# Patient Record
Sex: Female | Born: 1935 | Race: White | Hispanic: No | Smoking: Former smoker
Health system: Southern US, Community
[De-identification: ages and names within clinical notes are randomized; demographics above are authoritative.]

## PROBLEM LIST (undated history)

## (undated) DIAGNOSIS — F419 Anxiety disorder, unspecified: Secondary | ICD-10-CM

## (undated) DIAGNOSIS — K449 Diaphragmatic hernia without obstruction or gangrene: Secondary | ICD-10-CM

## (undated) DIAGNOSIS — K2970 Gastritis, unspecified, without bleeding: Secondary | ICD-10-CM

## (undated) DIAGNOSIS — J8410 Pulmonary fibrosis, unspecified: Secondary | ICD-10-CM

## (undated) DIAGNOSIS — D649 Anemia, unspecified: Secondary | ICD-10-CM

## (undated) DIAGNOSIS — K222 Esophageal obstruction: Secondary | ICD-10-CM

## (undated) DIAGNOSIS — C189 Malignant neoplasm of colon, unspecified: Secondary | ICD-10-CM

## (undated) DIAGNOSIS — I10 Essential (primary) hypertension: Secondary | ICD-10-CM

## (undated) DIAGNOSIS — N393 Stress incontinence (female) (male): Secondary | ICD-10-CM

## (undated) DIAGNOSIS — L820 Inflamed seborrheic keratosis: Secondary | ICD-10-CM

## (undated) DIAGNOSIS — I491 Atrial premature depolarization: Secondary | ICD-10-CM

## (undated) DIAGNOSIS — I2510 Atherosclerotic heart disease of native coronary artery without angina pectoris: Secondary | ICD-10-CM

## (undated) DIAGNOSIS — K2210 Ulcer of esophagus without bleeding: Secondary | ICD-10-CM

## (undated) DIAGNOSIS — I214 Non-ST elevation (NSTEMI) myocardial infarction: Secondary | ICD-10-CM

## (undated) DIAGNOSIS — L309 Dermatitis, unspecified: Secondary | ICD-10-CM

## (undated) DIAGNOSIS — K5790 Diverticulosis of intestine, part unspecified, without perforation or abscess without bleeding: Secondary | ICD-10-CM

## (undated) DIAGNOSIS — C50919 Malignant neoplasm of unspecified site of unspecified female breast: Secondary | ICD-10-CM

## (undated) HISTORY — PX: COLON SURGERY: SHX602

## (undated) HISTORY — PX: APPENDECTOMY: SHX54

## (undated) HISTORY — DX: Ulcer of esophagus without bleeding: K22.10

## (undated) HISTORY — PX: TONSILLECTOMY: SUR1361

## (undated) HISTORY — DX: Essential (primary) hypertension: I10

## (undated) HISTORY — PX: BREAST LUMPECTOMY: SHX2

## (undated) HISTORY — DX: Atherosclerotic heart disease of native coronary artery without angina pectoris: I25.10

## (undated) HISTORY — PX: CATARACT EXTRACTION, BILATERAL: SHX1313

## (undated) HISTORY — PX: TOTAL ABDOMINAL HYSTERECTOMY W/ BILATERAL SALPINGOOPHORECTOMY: SHX83

## (undated) HISTORY — PX: TOTAL ABDOMINAL HYSTERECTOMY: SHX209

## (undated) HISTORY — DX: Diaphragmatic hernia without obstruction or gangrene: K44.9

## (undated) HISTORY — DX: Dermatitis, unspecified: L30.9

## (undated) HISTORY — DX: Stress incontinence (female) (male): N39.3

## (undated) HISTORY — DX: Anxiety disorder, unspecified: F41.9

## (undated) HISTORY — PX: SHOULDER ARTHROSCOPY W/ ACROMIAL REPAIR: SUR94

## (undated) HISTORY — DX: Anemia, unspecified: D64.9

## (undated) HISTORY — DX: Malignant neoplasm of unspecified site of unspecified female breast: C50.919

## (undated) HISTORY — PX: DILATION AND CURETTAGE OF UTERUS: SHX78

## (undated) HISTORY — DX: Inflamed seborrheic keratosis: L82.0

## (undated) HISTORY — DX: Pulmonary fibrosis, unspecified: J84.10

## (undated) HISTORY — DX: Esophageal obstruction: K22.2

## (undated) HISTORY — DX: Diverticulosis of intestine, part unspecified, without perforation or abscess without bleeding: K57.90

---

## 1997-12-31 ENCOUNTER — Emergency Department (HOSPITAL_COMMUNITY): Admission: EM | Admit: 1997-12-31 | Discharge: 1997-12-31 | Payer: Self-pay | Admitting: Emergency Medicine

## 1998-01-01 ENCOUNTER — Ambulatory Visit (HOSPITAL_COMMUNITY): Admission: RE | Admit: 1998-01-01 | Discharge: 1998-01-01 | Payer: Self-pay | Admitting: Emergency Medicine

## 1998-01-01 ENCOUNTER — Encounter: Payer: Self-pay | Admitting: Emergency Medicine

## 1998-08-12 ENCOUNTER — Emergency Department (HOSPITAL_COMMUNITY): Admission: EM | Admit: 1998-08-12 | Discharge: 1998-08-12 | Payer: Self-pay | Admitting: Emergency Medicine

## 1998-10-09 ENCOUNTER — Ambulatory Visit (HOSPITAL_COMMUNITY): Admission: RE | Admit: 1998-10-09 | Discharge: 1998-10-09 | Payer: Self-pay | Admitting: Internal Medicine

## 1998-10-09 ENCOUNTER — Encounter (HOSPITAL_BASED_OUTPATIENT_CLINIC_OR_DEPARTMENT_OTHER): Payer: Self-pay | Admitting: Internal Medicine

## 1998-11-17 ENCOUNTER — Encounter (INDEPENDENT_AMBULATORY_CARE_PROVIDER_SITE_OTHER): Payer: Self-pay

## 1998-11-17 ENCOUNTER — Other Ambulatory Visit: Admission: RE | Admit: 1998-11-17 | Discharge: 1998-11-17 | Payer: Self-pay | Admitting: Gastroenterology

## 1998-12-16 ENCOUNTER — Ambulatory Visit (HOSPITAL_COMMUNITY): Admission: RE | Admit: 1998-12-16 | Discharge: 1998-12-16 | Payer: Self-pay | Admitting: Gastroenterology

## 1998-12-16 ENCOUNTER — Encounter: Payer: Self-pay | Admitting: Gastroenterology

## 1999-04-03 ENCOUNTER — Emergency Department (HOSPITAL_COMMUNITY): Admission: EM | Admit: 1999-04-03 | Discharge: 1999-04-03 | Payer: Self-pay | Admitting: Emergency Medicine

## 1999-10-11 ENCOUNTER — Encounter: Admission: RE | Admit: 1999-10-11 | Discharge: 1999-10-11 | Payer: Self-pay | Admitting: Family Medicine

## 1999-10-11 ENCOUNTER — Encounter: Payer: Self-pay | Admitting: Family Medicine

## 1999-10-15 ENCOUNTER — Encounter: Admission: RE | Admit: 1999-10-15 | Discharge: 1999-10-15 | Payer: Self-pay | Admitting: Family Medicine

## 1999-10-15 ENCOUNTER — Encounter: Payer: Self-pay | Admitting: Family Medicine

## 1999-10-20 ENCOUNTER — Encounter: Payer: Self-pay | Admitting: Family Medicine

## 1999-10-20 ENCOUNTER — Other Ambulatory Visit: Admission: RE | Admit: 1999-10-20 | Discharge: 1999-10-20 | Payer: Self-pay | Admitting: Family Medicine

## 1999-10-20 ENCOUNTER — Encounter (INDEPENDENT_AMBULATORY_CARE_PROVIDER_SITE_OTHER): Payer: Self-pay | Admitting: *Deleted

## 1999-10-20 ENCOUNTER — Encounter: Admission: RE | Admit: 1999-10-20 | Discharge: 1999-10-20 | Payer: Self-pay

## 1999-10-26 ENCOUNTER — Encounter: Payer: Self-pay | Admitting: Surgery

## 1999-10-26 ENCOUNTER — Encounter: Admission: RE | Admit: 1999-10-26 | Discharge: 1999-10-26 | Payer: Self-pay | Admitting: Surgery

## 1999-10-27 ENCOUNTER — Ambulatory Visit (HOSPITAL_BASED_OUTPATIENT_CLINIC_OR_DEPARTMENT_OTHER): Admission: RE | Admit: 1999-10-27 | Discharge: 1999-10-27 | Payer: Self-pay | Admitting: Surgery

## 1999-10-27 ENCOUNTER — Encounter: Payer: Self-pay | Admitting: Surgery

## 1999-10-27 ENCOUNTER — Encounter (INDEPENDENT_AMBULATORY_CARE_PROVIDER_SITE_OTHER): Payer: Self-pay | Admitting: *Deleted

## 1999-11-05 ENCOUNTER — Encounter: Admission: RE | Admit: 1999-11-05 | Discharge: 2000-02-03 | Payer: Self-pay | Admitting: Radiation Oncology

## 1999-12-10 ENCOUNTER — Emergency Department (HOSPITAL_COMMUNITY): Admission: EM | Admit: 1999-12-10 | Discharge: 1999-12-10 | Payer: Self-pay | Admitting: Emergency Medicine

## 2000-04-11 ENCOUNTER — Encounter: Admission: RE | Admit: 2000-04-11 | Discharge: 2000-04-11 | Payer: Self-pay | Admitting: Oncology

## 2000-04-11 ENCOUNTER — Encounter: Payer: Self-pay | Admitting: Oncology

## 2001-04-12 ENCOUNTER — Encounter: Admission: RE | Admit: 2001-04-12 | Discharge: 2001-04-12 | Payer: Self-pay | Admitting: Oncology

## 2001-04-12 ENCOUNTER — Encounter: Payer: Self-pay | Admitting: Oncology

## 2001-08-14 ENCOUNTER — Encounter: Admission: RE | Admit: 2001-08-14 | Discharge: 2001-08-14 | Payer: Self-pay | Admitting: Family Medicine

## 2001-08-14 ENCOUNTER — Encounter: Payer: Self-pay | Admitting: Family Medicine

## 2002-05-06 ENCOUNTER — Encounter: Payer: Self-pay | Admitting: Oncology

## 2002-05-06 ENCOUNTER — Encounter: Admission: RE | Admit: 2002-05-06 | Discharge: 2002-05-06 | Payer: Self-pay | Admitting: Oncology

## 2003-05-28 ENCOUNTER — Encounter: Admission: RE | Admit: 2003-05-28 | Discharge: 2003-05-28 | Payer: Self-pay | Admitting: Family Medicine

## 2003-12-09 ENCOUNTER — Ambulatory Visit: Payer: Self-pay | Admitting: Oncology

## 2004-01-04 HISTORY — PX: CARDIAC CATHETERIZATION: SHX172

## 2004-06-07 ENCOUNTER — Encounter: Admission: RE | Admit: 2004-06-07 | Discharge: 2004-06-07 | Payer: Self-pay | Admitting: Surgery

## 2004-06-09 ENCOUNTER — Ambulatory Visit: Payer: Self-pay | Admitting: Family Medicine

## 2004-06-10 ENCOUNTER — Ambulatory Visit: Payer: Self-pay | Admitting: Family Medicine

## 2004-06-10 ENCOUNTER — Encounter: Admission: RE | Admit: 2004-06-10 | Discharge: 2004-06-10 | Payer: Self-pay | Admitting: Family Medicine

## 2004-06-13 ENCOUNTER — Emergency Department (HOSPITAL_COMMUNITY): Admission: EM | Admit: 2004-06-13 | Discharge: 2004-06-13 | Payer: Self-pay | Admitting: Emergency Medicine

## 2004-06-14 ENCOUNTER — Ambulatory Visit: Payer: Self-pay | Admitting: Oncology

## 2004-06-15 ENCOUNTER — Ambulatory Visit: Payer: Self-pay | Admitting: Internal Medicine

## 2004-06-17 ENCOUNTER — Ambulatory Visit: Payer: Self-pay | Admitting: Internal Medicine

## 2004-06-28 ENCOUNTER — Ambulatory Visit: Payer: Self-pay | Admitting: Cardiology

## 2004-06-30 ENCOUNTER — Ambulatory Visit: Payer: Self-pay | Admitting: Cardiology

## 2004-06-30 ENCOUNTER — Inpatient Hospital Stay (HOSPITAL_BASED_OUTPATIENT_CLINIC_OR_DEPARTMENT_OTHER): Admission: RE | Admit: 2004-06-30 | Discharge: 2004-06-30 | Payer: Self-pay | Admitting: Cardiology

## 2004-07-13 ENCOUNTER — Ambulatory Visit: Payer: Self-pay | Admitting: *Deleted

## 2004-07-20 ENCOUNTER — Ambulatory Visit: Payer: Self-pay | Admitting: Cardiology

## 2004-07-26 ENCOUNTER — Encounter (HOSPITAL_COMMUNITY): Admission: RE | Admit: 2004-07-26 | Discharge: 2004-10-24 | Payer: Self-pay | Admitting: Cardiology

## 2004-09-09 ENCOUNTER — Ambulatory Visit: Payer: Self-pay | Admitting: Cardiology

## 2004-09-30 ENCOUNTER — Ambulatory Visit: Payer: Self-pay

## 2004-11-09 ENCOUNTER — Ambulatory Visit: Payer: Self-pay

## 2004-12-10 ENCOUNTER — Ambulatory Visit: Payer: Self-pay | Admitting: Oncology

## 2005-07-01 ENCOUNTER — Encounter: Admission: RE | Admit: 2005-07-01 | Discharge: 2005-07-01 | Payer: Self-pay | Admitting: Family Medicine

## 2005-07-21 ENCOUNTER — Ambulatory Visit: Payer: Self-pay | Admitting: Family Medicine

## 2005-07-21 ENCOUNTER — Emergency Department (HOSPITAL_COMMUNITY): Admission: EM | Admit: 2005-07-21 | Discharge: 2005-07-21 | Payer: Self-pay | Admitting: Emergency Medicine

## 2005-07-22 ENCOUNTER — Ambulatory Visit: Payer: Self-pay | Admitting: Family Medicine

## 2005-07-25 ENCOUNTER — Ambulatory Visit: Payer: Self-pay | Admitting: Family Medicine

## 2005-08-02 ENCOUNTER — Ambulatory Visit: Payer: Self-pay | Admitting: Cardiology

## 2005-08-02 ENCOUNTER — Ambulatory Visit: Payer: Self-pay | Admitting: Family Medicine

## 2005-08-02 ENCOUNTER — Inpatient Hospital Stay (HOSPITAL_COMMUNITY): Admission: AD | Admit: 2005-08-02 | Discharge: 2005-08-04 | Payer: Self-pay | Admitting: Cardiology

## 2005-08-03 ENCOUNTER — Ambulatory Visit: Payer: Self-pay | Admitting: Cardiology

## 2005-08-03 ENCOUNTER — Encounter: Payer: Self-pay | Admitting: Cardiology

## 2005-08-08 ENCOUNTER — Ambulatory Visit: Payer: Self-pay | Admitting: Internal Medicine

## 2005-08-12 ENCOUNTER — Ambulatory Visit: Payer: Self-pay

## 2005-08-16 ENCOUNTER — Ambulatory Visit: Payer: Self-pay | Admitting: Cardiology

## 2005-08-18 ENCOUNTER — Ambulatory Visit: Payer: Self-pay | Admitting: Family Medicine

## 2005-08-30 ENCOUNTER — Emergency Department (HOSPITAL_COMMUNITY): Admission: EM | Admit: 2005-08-30 | Discharge: 2005-08-30 | Payer: Self-pay | Admitting: Family Medicine

## 2005-09-01 ENCOUNTER — Ambulatory Visit: Payer: Self-pay | Admitting: Cardiology

## 2005-09-02 ENCOUNTER — Emergency Department (HOSPITAL_COMMUNITY): Admission: EM | Admit: 2005-09-02 | Discharge: 2005-09-02 | Payer: Self-pay | Admitting: Family Medicine

## 2005-10-31 ENCOUNTER — Ambulatory Visit: Payer: Self-pay | Admitting: Family Medicine

## 2005-11-15 ENCOUNTER — Ambulatory Visit: Payer: Self-pay | Admitting: Family Medicine

## 2006-02-07 ENCOUNTER — Ambulatory Visit: Payer: Self-pay | Admitting: Cardiology

## 2006-07-11 ENCOUNTER — Encounter: Admission: RE | Admit: 2006-07-11 | Discharge: 2006-07-11 | Payer: Self-pay | Admitting: Family Medicine

## 2006-07-31 DIAGNOSIS — I10 Essential (primary) hypertension: Secondary | ICD-10-CM | POA: Insufficient documentation

## 2006-07-31 DIAGNOSIS — Z853 Personal history of malignant neoplasm of breast: Secondary | ICD-10-CM | POA: Insufficient documentation

## 2006-08-16 ENCOUNTER — Telehealth: Payer: Self-pay | Admitting: Family Medicine

## 2006-09-22 ENCOUNTER — Ambulatory Visit: Payer: Self-pay | Admitting: Family Medicine

## 2006-09-25 ENCOUNTER — Encounter: Payer: Self-pay | Admitting: Family Medicine

## 2006-09-25 LAB — CONVERTED CEMR LAB
Bilirubin Urine: NEGATIVE
Glucose, Urine, Semiquant: NEGATIVE
Nitrite: POSITIVE
WBC Urine, dipstick: NEGATIVE

## 2006-11-10 ENCOUNTER — Ambulatory Visit: Payer: Self-pay | Admitting: Family Medicine

## 2006-11-10 DIAGNOSIS — L820 Inflamed seborrheic keratosis: Secondary | ICD-10-CM

## 2007-06-17 ENCOUNTER — Emergency Department (HOSPITAL_COMMUNITY): Admission: EM | Admit: 2007-06-17 | Discharge: 2007-06-17 | Payer: Self-pay | Admitting: Family Medicine

## 2007-07-02 ENCOUNTER — Ambulatory Visit: Payer: Self-pay | Admitting: Family Medicine

## 2007-07-02 DIAGNOSIS — L02419 Cutaneous abscess of limb, unspecified: Secondary | ICD-10-CM

## 2007-07-02 DIAGNOSIS — L242 Irritant contact dermatitis due to solvents: Secondary | ICD-10-CM

## 2007-07-02 DIAGNOSIS — L03119 Cellulitis of unspecified part of limb: Secondary | ICD-10-CM

## 2007-07-20 ENCOUNTER — Ambulatory Visit: Payer: Self-pay | Admitting: Family Medicine

## 2007-08-01 ENCOUNTER — Encounter: Admission: RE | Admit: 2007-08-01 | Discharge: 2007-08-01 | Payer: Self-pay | Admitting: Family Medicine

## 2007-08-27 ENCOUNTER — Telehealth: Payer: Self-pay | Admitting: Internal Medicine

## 2007-10-03 ENCOUNTER — Ambulatory Visit: Payer: Self-pay | Admitting: Family Medicine

## 2007-10-03 DIAGNOSIS — I2510 Atherosclerotic heart disease of native coronary artery without angina pectoris: Secondary | ICD-10-CM

## 2007-10-03 LAB — CONVERTED CEMR LAB
Bilirubin Urine: NEGATIVE
Blood in Urine, dipstick: NEGATIVE
Nitrite: NEGATIVE
Protein, U semiquant: NEGATIVE
Specific Gravity, Urine: 1.01
Urobilinogen, UA: 0.2

## 2007-10-04 LAB — CONVERTED CEMR LAB
ALT: 14 units/L (ref 0–35)
Albumin: 4.1 g/dL (ref 3.5–5.2)
BUN: 21 mg/dL (ref 6–23)
Eosinophils Absolute: 0.2 10*3/uL (ref 0.0–0.7)
GFR calc non Af Amer: 85 mL/min
Glucose, Bld: 87 mg/dL (ref 70–99)
Lymphocytes Relative: 17.3 % (ref 12.0–46.0)
MCHC: 34.6 g/dL (ref 30.0–36.0)
MCV: 97.8 fL (ref 78.0–100.0)
Monocytes Relative: 11.4 % (ref 3.0–12.0)
Neutrophils Relative %: 68.4 % (ref 43.0–77.0)
Platelets: 171 10*3/uL (ref 150–400)
RDW: 13.9 % (ref 11.5–14.6)
TSH: 3.36 microintl units/mL (ref 0.35–5.50)
Total Bilirubin: 0.8 mg/dL (ref 0.3–1.2)

## 2008-03-19 ENCOUNTER — Telehealth: Payer: Self-pay | Admitting: Family Medicine

## 2008-07-22 ENCOUNTER — Ambulatory Visit: Payer: Self-pay | Admitting: Family Medicine

## 2008-07-22 DIAGNOSIS — R0789 Other chest pain: Secondary | ICD-10-CM | POA: Insufficient documentation

## 2008-07-23 ENCOUNTER — Telehealth: Payer: Self-pay | Admitting: Family Medicine

## 2008-07-24 ENCOUNTER — Emergency Department (HOSPITAL_COMMUNITY): Admission: EM | Admit: 2008-07-24 | Discharge: 2008-07-24 | Payer: Self-pay | Admitting: Emergency Medicine

## 2008-08-15 ENCOUNTER — Ambulatory Visit: Payer: Self-pay | Admitting: Internal Medicine

## 2008-08-15 DIAGNOSIS — J8410 Pulmonary fibrosis, unspecified: Secondary | ICD-10-CM

## 2008-08-20 LAB — CONVERTED CEMR LAB
Eosinophils Absolute: 0.2 10*3/uL (ref 0.0–0.7)
Hemoglobin: 12 g/dL (ref 12.0–15.0)
Lymphocytes Relative: 26.8 % (ref 12.0–46.0)
MCHC: 34.7 g/dL (ref 30.0–36.0)
MCV: 96.5 fL (ref 78.0–100.0)
Monocytes Absolute: 0.6 10*3/uL (ref 0.1–1.0)
Neutro Abs: 2.2 10*3/uL (ref 1.4–7.7)
Neutrophils Relative %: 52.8 % (ref 43.0–77.0)
RBC: 3.58 M/uL — ABNORMAL LOW (ref 3.87–5.11)
RDW: 13.1 % (ref 11.5–14.6)

## 2008-10-07 ENCOUNTER — Telehealth: Payer: Self-pay | Admitting: Family Medicine

## 2009-01-05 ENCOUNTER — Ambulatory Visit: Payer: Self-pay | Admitting: Family Medicine

## 2009-10-13 ENCOUNTER — Ambulatory Visit: Payer: Self-pay | Admitting: Family Medicine

## 2009-10-13 DIAGNOSIS — N393 Stress incontinence (female) (male): Secondary | ICD-10-CM

## 2009-10-13 LAB — CONVERTED CEMR LAB
Ketones, urine, test strip: NEGATIVE
Specific Gravity, Urine: 1.02
pH: 6

## 2009-10-27 ENCOUNTER — Telehealth: Payer: Self-pay | Admitting: Family Medicine

## 2010-01-08 ENCOUNTER — Ambulatory Visit
Admission: RE | Admit: 2010-01-08 | Discharge: 2010-01-08 | Payer: Self-pay | Source: Home / Self Care | Attending: Family Medicine | Admitting: Family Medicine

## 2010-01-08 ENCOUNTER — Encounter: Payer: Self-pay | Admitting: Family Medicine

## 2010-01-08 ENCOUNTER — Other Ambulatory Visit: Payer: Self-pay | Admitting: Family Medicine

## 2010-01-08 LAB — CONVERTED CEMR LAB
Glucose, Urine, Semiquant: NEGATIVE
Ketones, urine, test strip: NEGATIVE
Specific Gravity, Urine: 1.01
WBC Urine, dipstick: NEGATIVE
pH: 6

## 2010-01-08 LAB — BASIC METABOLIC PANEL
BUN: 18 mg/dL (ref 6–23)
CO2: 32 mEq/L (ref 19–32)
Calcium: 8.8 mg/dL (ref 8.4–10.5)
Chloride: 100 mEq/L (ref 96–112)
Creatinine, Ser: 0.9 mg/dL (ref 0.4–1.2)
GFR: 67.22 mL/min (ref >60.00)
Glucose, Bld: 90 mg/dL (ref 70–99)
Potassium: 4.4 mEq/L (ref 3.5–5.1)
Sodium: 139 mEq/L (ref 135–145)

## 2010-01-08 LAB — CBC WITH DIFFERENTIAL/PLATELET
Basophils Absolute: 0 10*3/uL (ref 0.0–0.1)
Basophils Relative: 0.4 % (ref 0.0–3.0)
Eosinophils Absolute: 0.2 10*3/uL (ref 0.0–0.7)
Eosinophils Relative: 2.7 % (ref 0.0–5.0)
HCT: 37.4 % (ref 36.0–46.0)
Hemoglobin: 12.7 g/dL (ref 12.0–15.0)
Lymphocytes Relative: 19.2 % (ref 12.0–46.0)
Lymphs Abs: 1.4 10*3/uL (ref 0.7–4.0)
MCHC: 34 g/dL (ref 30.0–36.0)
MCV: 99 fl (ref 78.0–100.0)
Monocytes Absolute: 0.8 10*3/uL (ref 0.1–1.0)
Monocytes Relative: 11.3 % (ref 3.0–12.0)
Neutro Abs: 4.9 10*3/uL (ref 1.4–7.7)
Neutrophils Relative %: 66.4 % (ref 43.0–77.0)
Platelets: 181 10*3/uL (ref 150.0–400.0)
RBC: 3.77 Mil/uL — ABNORMAL LOW (ref 3.87–5.11)
RDW: 13.9 % (ref 11.5–14.6)
WBC: 7.4 10*3/uL (ref 4.5–10.5)

## 2010-01-08 LAB — TSH: TSH: 3.34 u[IU]/mL (ref 0.35–5.50)

## 2010-01-08 LAB — HEPATIC FUNCTION PANEL
ALT: 14 U/L (ref 0–35)
AST: 23 U/L (ref 0–37)
Albumin: 3.8 g/dL (ref 3.5–5.2)
Alkaline Phosphatase: 57 U/L (ref 39–117)
Bilirubin, Direct: 0.2 mg/dL (ref 0.0–0.3)
Total Bilirubin: 0.5 mg/dL (ref 0.3–1.2)
Total Protein: 6.5 g/dL (ref 6.0–8.3)

## 2010-01-22 ENCOUNTER — Ambulatory Visit: Admit: 2010-01-22 | Payer: Self-pay | Admitting: Family Medicine

## 2010-01-31 LAB — CONVERTED CEMR LAB
ALT: 14 units/L (ref 0–35)
AST: 19 units/L (ref 0–37)
AST: 21 units/L (ref 0–37)
Albumin: 3.6 g/dL (ref 3.5–5.2)
Albumin: 3.9 g/dL (ref 3.5–5.2)
Alkaline Phosphatase: 45 units/L (ref 39–117)
BUN: 14 mg/dL (ref 6–23)
Basophils Absolute: 0.1 10*3/uL (ref 0.0–0.1)
Basophils Relative: 0.9 % (ref 0.0–3.0)
Bilirubin, Direct: 0 mg/dL (ref 0.0–0.3)
CO2: 31 meq/L (ref 19–32)
CO2: 34 meq/L — ABNORMAL HIGH (ref 19–32)
Calcium: 8.9 mg/dL (ref 8.4–10.5)
Chloride: 101 meq/L (ref 96–112)
Chloride: 105 meq/L (ref 96–112)
Creatinine, Ser: 0.7 mg/dL (ref 0.4–1.2)
Creatinine, Ser: 0.7 mg/dL (ref 0.4–1.2)
Eosinophils Absolute: 0.2 10*3/uL (ref 0.0–0.6)
Eosinophils Absolute: 0.3 10*3/uL (ref 0.0–0.7)
Eosinophils Relative: 3 % (ref 0.0–5.0)
Eosinophils Relative: 3.4 % (ref 0.0–5.0)
GFR calc non Af Amer: 84.3 mL/min (ref >60)
GFR calc non Af Amer: 85 mL/min
Glucose, Bld: 90 mg/dL (ref 70–99)
Glucose, Bld: 96 mg/dL (ref 70–99)
HCT: 35.7 % — ABNORMAL LOW (ref 36.0–46.0)
HCT: 37.2 % (ref 36.0–46.0)
Hemoglobin: 12.3 g/dL (ref 12.0–15.0)
Hemoglobin: 12.6 g/dL (ref 12.0–15.0)
Lymphocytes Relative: 20 % (ref 12.0–46.0)
Lymphocytes Relative: 30.9 % (ref 12.0–46.0)
Lymphs Abs: 1.6 10*3/uL (ref 0.7–4.0)
MCHC: 33.9 g/dL (ref 30.0–36.0)
MCHC: 34.4 g/dL (ref 30.0–36.0)
MCV: 102.9 fL — ABNORMAL HIGH (ref 78.0–100.0)
MCV: 97.8 fL (ref 78.0–100.0)
Monocytes Absolute: 0.9 10*3/uL (ref 0.1–1.0)
Monocytes Relative: 11.3 % (ref 3.0–12.0)
Neutro Abs: 5 10*3/uL (ref 1.4–7.7)
Neutrophils Relative %: 49.9 % (ref 43.0–77.0)
Neutrophils Relative %: 64.4 % (ref 43.0–77.0)
Platelets: 140 10*3/uL — ABNORMAL LOW (ref 150.0–400.0)
Potassium: 4.4 meq/L (ref 3.5–5.1)
RBC: 3.61 M/uL — ABNORMAL LOW (ref 3.87–5.11)
RBC: 3.65 M/uL — ABNORMAL LOW (ref 3.87–5.11)
RDW: 12.4 % (ref 11.5–14.6)
RDW: 14 % (ref 11.5–14.6)
Sodium: 141 meq/L (ref 135–145)
TSH: 1.95 microintl units/mL (ref 0.35–5.50)
TSH: 2.54 microintl units/mL (ref 0.35–5.50)
Total Bilirubin: 0.7 mg/dL (ref 0.3–1.2)
Total Bilirubin: 0.8 mg/dL (ref 0.3–1.2)
Total Protein: 6.5 g/dL (ref 6.0–8.3)
Total Protein: 7.1 g/dL (ref 6.0–8.3)
Vitamin B-12: 659 pg/mL (ref 211–911)
WBC: 6.6 10*3/uL (ref 4.5–10.5)
WBC: 7.9 10*3/uL (ref 4.5–10.5)

## 2010-02-02 NOTE — Progress Notes (Signed)
Summary: rx for cream  Phone Note From Pharmacy   Caller: [facility]. #[id number]* Summary of Call: [facility] is calling because the patient states that she was to get a rx for an anti-fungal cream? Initial call taken by: Kern Reap CMA Duncan Dull),  October 27, 2009 12:46 PM  Follow-up for Phone Call        OTC is fine Follow-up by: Roderick Pee MD,  October 27, 2009 12:48 PM  Additional Follow-up for Phone Call Additional follow up Details #1::        Phone call completed Additional Follow-up by: Kern Reap CMA Duncan Dull),  October 28, 2009 10:00 AM

## 2010-02-02 NOTE — Assessment & Plan Note (Signed)
Summary: BLADDER CONCERNS // RS   Vital Signs:  Patient profile:   77 year old female Weight:      168 pounds BMI:     28.94 Temp:     98.3 degrees F oral Pulse rate:   103 / minute Pulse rhythm:   regular BP sitting:   148 / 80  (left arm) Cuff size:   regular  Vitals Entered By: Mervin Hack CMA Duncan Dull) (October 13, 2009 10:11 AM) CC: overactive bladder   Primary Care Provider:  Dr. Kelle Darting  CC:  overactive bladder.  History of Present Illness: [name] is an 77 year old, divorced female who comes in today for evaluation of urinary incontinence.  She states she has chronic leakage in the past 6 months as gotten worse.  She has to constantly change her pads all day long.  She has the symptoms of infection.  Urinalysis here normal.  In the past.  She saw Dr. Etta Grandchild , before he retired.......I think it would be prudent for her to get another urologic consult.  I would recommend Dr. Charlyn Minerva   Allergies: 1)  ! Sulfa 2)  ! Codeine 3)  ! Indomethacin (Indomethacin) 4)  ! Pcn  Past History:  Past medical, surgical, family and social histories (including risk factors) reviewed for relevance to current acute and chronic problems.  Past Medical History: Reviewed history from 08/15/2008 and no changes required. Breast cancer, hx of     - lumpectomy followed by RT and Tamoxifen x 5 year Hypertension Colonoscopy-1998 Coronary artery disease dermatitis  Past Surgical History: Reviewed history from 07/31/2006 and no changes required. Hysterectomy Lumpectomy Oophorectomy Bilat shoulder repair  Family History: Reviewed history from 08/15/2008 and no changes required. Breast CA- Daughter Colon CA- Brother Heart dz- Mother and Father  Social History: Reviewed history from 08/15/2008 and no changes required. Retired Divorced Former Smoker. Quit in 1985.  Smoked aprox 30 years "socially" Alcohol use-twice per wk Drug use-no Regular exercise-yes Lives  alone  Review of Systems      See HPI  Physical Exam  General:  Well-developed,well-nourished,in no acute distress; alert,appropriate and cooperative throughout examination   Problems:  Medical Problems Added: 1)  Dx of Incontinence, Female Stress  (ICD-625.6)  Impression & Recommendations:  Problem # 1:  INCONTINENCE, FEMALE STRESS (ICD-625.6) Assessment New  Orders: UA Dipstick w/o Micro (manual) (44034)  Complete Medication List: 1)  Cartia Xt 120 Mg Cp24 (Diltiazem hcl coated beads) .... Once daily 2)  Klor-con M20 20 Meq Tbcr (Potassium chloride crys cr) .Marland Kitchen.. 1 3 times per wk 3)  Furosemide 20 Mg Tabs (Furosemide) .Marland Kitchen.. 1 once daily 4)  Lidex 0.05 % Crea (Fluocinonide) .... Apply at bedtime 5)  Lumigan 0.03 % Soln (Bimatoprost) .Marland Kitchen.. 1 drop each eye at bedtime 6)  Celebrex 100 Mg Caps (Celecoxib) .... Once daily  Other Orders: Influenza Vaccine MCR (74259)  Patient Instructions: 1)  call, Dr. Charlyn Minerva at the [facility]rology Center for a consultation.  I will send a copy of your medical records for him  Current Allergies (reviewed today): ! SULFA ! CODEINE ! INDOMETHACIN (INDOMETHACIN) ! PCN  Laboratory Results   Urine Tests  Date/Time Received: October 13, 2009 10:22 AM Date/Time Reported: October 13, 2009 10:22 AM  Routine Urinalysis   Color: yellow Appearance: Hazy Glucose: negative   (Normal Range: Negative) Bilirubin: negative   (Normal Range: Negative) Ketone: negative   (Normal Range: Negative) Spec. Gravity: 1.020   (Normal Range: 1.003-1.035) Blood: negative   (  Normal Range: Negative) pH: 6.0   (Normal Range: 5.0-8.0) Protein: negative   (Normal Range: Negative) Urobilinogen: 0.2   (Normal Range: 0-1) Nitrite: positive   (Normal Range: Negative) Leukocyte Esterace: trace   (Normal Range: Negative)        Influenza Vaccine    Vaccine Type: Fluvax MCR    Site: left deltoid    Mfr: GlaxoSmithKline    Dose: 0.5 ml    Route: IM    Given by:  Mervin Hack CMA (AAMA)    Exp. Date: 07/03/2010    Lot #: [id number]    VIS given: 07/28/09 version given October 13, 2009.  Flu Vaccine Consent Questions    Do you have a history of severe allergic reactions to this vaccine? no    Any prior history of allergic reactions to egg and/or gelatin? no    Do you have a sensitivity to the preservative Thimersol? no    Do you have a past history of Guillan-Barre Syndrome? no    Do you currently have an acute febrile illness? no    Have you ever had a severe reaction to latex? no    Vaccine information given and explained to patient? yes    Are you currently pregnant? no

## 2010-02-02 NOTE — Assessment & Plan Note (Signed)
Summary: emp/pt fasting/cjr/pt rsc/cjr   Vital Signs:  Patient profile:   77 year old female Height:      64 inches Weight:      168 pounds Temp:     98.7 degrees F oral BP sitting:   128 / 78  (left arm) Cuff size:   regular  Vitals Entered By: Kern Reap CMA Duncan Dull) (January 05, 2009 9:37 AM)  Reason for Visit cpx  Primary Care Provider:  Dr. Kelle Darting   History of Present Illness: [name] is an 77 year old single female, nonsmoker, who continues to live and function independently, who comes in today for evaluation of multiple issues.  She has underlying hypertension, for which he takes the deltiazem -120 mg daily,potassium 20 mEq 3 times per week, and Lasix 20 mg daily.  BP 120/78.  She has underlying glaucoma.  She is on eyedrops and sees her ophthalmologist every 6 months.  She has osteoarthritis for which he takes Celebrex 100 mg daily.  She's had extensive workup with neurology and orthopedics.  No correctable causes were found.  Three years ago, when she had a syncopal episode.  She was placed on nitroglycerin patches, one daily.  She misses him about 50% of time and does not feel any adverse effects.  She was told that time that her coronaries were normal.  She wants to know if she could stop the nitroglycerin.  She walks without chest pain.  She also uses Lidex .05% for eczema.  She has urinary incontinence, but all the medications are contraindicated because of her glaucoma.  Tetanus booster 2006, seasonal flu 2010, Pneumovax 2007, shingles 2009.  Her daughter lives here in town.[place] who has health care and financial power-of-attorney.  Kimbree does not wish to be kept alive by artificial means.  No code blue.  No feeding tube etc. also she had right breast cancer was treated with lumpectomy and radiation.  Follow-up mammography.  Normal  Allergies: 1)  ! Sulfa 2)  ! Codeine 3)  ! Indomethacin (Indomethacin) 4)  ! Pcn  Past History:  Past medical,  surgical, family and social histories (including risk factors) reviewed, and no changes noted (except as noted below).  Past Medical History: Reviewed history from 08/15/2008 and no changes required. Breast cancer, hx of     - lumpectomy followed by RT and Tamoxifen x 5 year Hypertension Colonoscopy-1998 Coronary artery disease dermatitis  Past Surgical History: Reviewed history from 07/31/2006 and no changes required. Hysterectomy Lumpectomy Oophorectomy Bilat shoulder repair  Family History: Reviewed history from 08/15/2008 and no changes required. Breast CA- Daughter Colon CA- Brother Heart dz- Mother and Father  Social History: Reviewed history from 08/15/2008 and no changes required. Retired Divorced Former Smoker. Quit in 1985.  Smoked aprox 30 years "socially" Alcohol use-twice per wk Drug use-no Regular exercise-yes Lives alone  Review of Systems      See HPI  Physical Exam  General:  Well-developed,well-nourished,in no acute distress; alert,appropriate and cooperative throughout examination Head:  Normocephalic and atraumatic without obvious abnormalities. No apparent alopecia or balding. Eyes:  pupils are pinpoint. Ears:  External ear exam shows no significant lesions or deformities.  Otoscopic examination reveals clear canals, tympanic membranes are intact bilaterally without bulging, retraction, inflammation or bilateral hearing aidsdischarge. Hearing is grossly normal bilaterally. Nose:  External nasal examination shows no deformity or inflammation. Nasal mucosa are pink and moist without lesions or exudates. Mouth:  Oral mucosa and oropharynx without lesions or exudates.  Teeth in good  repair. Neck:  No deformities, masses, or tenderness noted. Chest Wall:  No deformities, masses, or tenderness noted. Breasts:  left breast normal right breast scar from previous surgery and radiation.  At this radiation.  No palpable masses Lungs:  Normal respiratory  effort, chest expands symmetrically. Lungs are clear to auscultation, no crackles or wheezes. Heart:  Normal rate and regular rhythm. S1 and S2 normal without gallop, murmur, click, rub or other extra sounds. Abdomen:  Bowel sounds positive,abdomen soft and non-tender without masses, organomegaly or hernias noted. Msk:  No deformity or scoliosis noted of thoracic or lumbar spine.   Pulses:  R and L carotid,radial,femoral,dorsalis pedis and posterior tibial pulses are full and equal bilaterally Extremities:  No clubbing, cyanosis, edema, or deformity noted with normal full range of motion of all joints.   Neurologic:  No cranial nerve deficits noted. Station and gait are normal. Plantar reflexes are down-going bilaterally. DTRs are symmetrical throughout. Sensory, motor and coordinative functions appear intact. Skin:  Intact without suspicious lesions or rashes Cervical Nodes:  No lymphadenopathy noted Axillary Nodes:  No palpable lymphadenopathy Inguinal Nodes:  No significant adenopathy Psych:  Cognition and judgment appear intact. Alert and cooperative with normal attention span and concentration. No apparent delusions, illusions, hallucinations   Impression & Recommendations:  Problem # 1:  HYPERTENSION (ICD-401.9) Assessment Improved  Her updated medication list for this problem includes:    Cartia Xt 120 Mg Cp24 (Diltiazem hcl coated beads) ..... Once daily    Furosemide 20 Mg Tabs (Furosemide) .Marland Kitchen... 1 once daily  Orders: Venipuncture (17616) UA Dipstick w/o Micro (automated)  (81003) EKG w/ Interpretation (93000) TLB-BMP (Basic Metabolic Panel-BMET) (80048-METABOL) TLB-CBC Platelet - w/Differential (85025-CBCD) TLB-Hepatic/Liver Function Pnl (80076-HEPATIC) TLB-TSH (Thyroid Stimulating Hormone) (84443-TSH)  Problem # 2:  BREAST CANCER, HX OF (ICD-V10.3) Assessment: Unchanged  Orders: Venipuncture (07371) TLB-BMP (Basic Metabolic Panel-BMET) (80048-METABOL) TLB-CBC Platelet  - w/Differential (85025-CBCD) TLB-Hepatic/Liver Function Pnl (80076-HEPATIC) TLB-TSH (Thyroid Stimulating Hormone) (84443-TSH)  Problem # 3:  CONTACT DERMATITIS&OTHER ECZEMA DUE TO SOLVENTS (ICD-692.2) Assessment: Improved  Her updated medication list for this problem includes:    Lidex 0.05 % Crea (Fluocinonide) .Marland Kitchen... Apply at bedtime  Orders: Venipuncture (06269) TLB-BMP (Basic Metabolic Panel-BMET) (80048-METABOL) TLB-CBC Platelet - w/Differential (85025-CBCD) TLB-Hepatic/Liver Function Pnl (80076-HEPATIC) TLB-TSH (Thyroid Stimulating Hormone) (84443-TSH)  Complete Medication List: 1)  Cartia Xt 120 Mg Cp24 (Diltiazem hcl coated beads) .... Once daily 2)  Klor-con M20 20 Meq Tbcr (Potassium chloride crys cr) .Marland Kitchen.. 1 3 times per wk 3)  Furosemide 20 Mg Tabs (Furosemide) .Marland Kitchen.. 1 once daily 4)  Lidex 0.05 % Crea (Fluocinonide) .... Apply at bedtime 5)  Lumigan 0.03 % Soln (Bimatoprost) .Marland Kitchen.. 1 drop each eye at bedtime 6)  Celebrex 100 Mg Caps (Celecoxib) .... Once daily  Other Orders: Prescription Created Electronically [phone])  Patient Instructions: 1)  Please schedule a follow-up appointment in 1 year. 2)  stop the nitroglycerin as we discussed Prescriptions: CELEBREX 100 MG CAPS (CELECOXIB) once daily  #100 x 3   Entered and Authorized by:   Roderick Pee MD   Signed by:   Roderick Pee MD on 01/05/2009   Method used:   Electronically to        [facility]. [phone]* (retail)       [address]       [place], [place]  [address]       Ph: [phone]  Fax: [phone]   RxID:   [id number] LIDEX 0.05 %  CREA (FLUOCINONIDE) apply at bedtime  #60 gr. x 3   Entered and Authorized by:   Roderick Pee MD   Signed by:   Roderick Pee MD on 01/05/2009   Method used:   Electronically to        [facility]. [phone]* (retail)       [address]       [place], [place]  [address]       Ph:  [phone]       Fax: [phone]   RxID:   [id number] FUROSEMIDE 20 MG  TABS (FUROSEMIDE) 1 once daily  #100 x 3   Entered and Authorized by:   Roderick Pee MD   Signed by:   Roderick Pee MD on 01/05/2009   Method used:   Electronically to        [facility]. [phone]* (retail)       [address]       [place], [place]  [address]       Ph: [phone]       Fax: [phone]   RxID:   [id number] KLOR-CON M20 20 MEQ TBCR (POTASSIUM CHLORIDE CRYS CR) 1 3 times per wk  #100 x 3   Entered and Authorized by:   Roderick Pee MD   Signed by:   Roderick Pee MD on 01/05/2009   Method used:   Electronically to        [facility]. [phone]* (retail)       [address]       [place], [place]  [address]       Ph: [phone]       Fax: [phone]   RxID:   [id number] CARTIA XT 120 MG  CP24 (DILTIAZEM HCL COATED BEADS) once daily  #100 x 3   Entered and Authorized by:   Roderick Pee MD   Signed by:   Roderick Pee MD on 01/05/2009   Method used:   Electronically to        [facility]. [phone]* (retail)       [address]       [place], [place]  [address]       Ph: [phone]       Fax: [phone]   RxID:   [id number]     Appended Document: emp/pt fasting/cjr/pt rsc/cjr  Laboratory Results   Urine Tests    Routine Urinalysis   Color: yellow Appearance: Hazy Glucose: negative   (Normal Range: Negative) Bilirubin: negative   (Normal Range: Negative) Ketone: negative   (Normal Range: Negative) Spec. Gravity: 1.015   (Normal Range: 1.003-1.035) Blood: trace-intact   (Normal Range: Negative) pH: 7.0   (Normal Range: 5.0-8.0) Protein: negative   (Normal Range: Negative) Urobilinogen: 0.2   (Normal Range: 0-1) Nitrite: positive   (Normal Range: Negative) Leukocyte Esterace: trace   (Normal Range: Negative)    Comments: Very strong  fishy smell. Joanne Chars CMA  January 08, 2009 9:27 AM

## 2010-02-04 NOTE — Assessment & Plan Note (Signed)
Summary: CPX (PT WILL COME IN FASTING) // RS   Vital Signs:  Patient profile:   77 year old female Height:      64.25 inches Weight:      171 pounds Temp:     97.6 degrees F oral BP sitting:   160 / 90  (left arm) Cuff size:   regular  Vitals Entered By: Kern Reap CMA Duncan Dull) (January 08, 2010 8:34 AM) CC: yearly wellness exam Is Patient Diabetic? No   Primary Care Provider:  Dr. Kelle Darting  CC:  yearly wellness exam.  History of Present Illness: [name] is an 77 year old, divorced female.......... husband was the urologist....... who is independently, and comes in today for general Medicare wellness exam.  She has underlying hypertension, for which he takes diltiazem 120 mg daily, potassium 20 mEq 3 times per week, Lasix, 20 mg daily,.  She also has eczema and uses a combination of Lidex and Lubriderm on her lower extremities.  Jomarie Longs, glaucoma, and is on eye drops be her ophthalmologist, and sees them about every 3 months.  She gets routine eye care, dental care, BSE monthly, annual mammography.......Marland Kitchen breast cancer, right breast 20 years ago.  No recurrence....... she H. doubt of colonoscopies.  Tetanus 2006, season flu 2011, Pneumovax 2007, shingles 2009. Here for Medicare AWV:  1.   Risk factors based on Past M, S, F history:...reviewed.  No changes 2.   Physical Activities: sedentary.  He does not walk daily 3.   Depression/mood: good mood.  No depression 4.   Hearing: very bad.  Bilateral hearing aids that did not work well 5.   ADL's: functions independently 6.   Fall Risk: reviewed not identified 7.   Home Safety: no guns in the house 8.   Height, weight, &visual acuity:height weight, normal.  Vision normal 9.   Counseling: begin walking program 10.   Labs ordered based on risk factors: done today 11.           Referral Coordination...Marland KitchenMarland KitchenMarland Kitchennone indicated 12.           Care Plan......Marland Kitchenreviewed all medications 13.            Cognitive Assessment .Marland Kitchen..oriented x 3,  financial help from her daughter  Allergies: 1)  ! Sulfa 2)  ! Codeine 3)  ! Indomethacin (Indomethacin) 4)  ! Pcn  Past History:  Past medical, surgical, family and social histories (including risk factors) reviewed, and no changes noted (except as noted below).  Past Medical History: Reviewed history from 08/15/2008 and no changes required. Breast cancer, hx of     - lumpectomy followed by RT and Tamoxifen x 5 year Hypertension Colonoscopy-1998 Coronary artery disease dermatitis  Past Surgical History: Reviewed history from 07/31/2006 and no changes required. Hysterectomy Lumpectomy Oophorectomy Bilat shoulder repair  Family History: Reviewed history from 08/15/2008 and no changes required. Breast CA- Daughter Colon CA- Brother Heart dz- Mother and Father  Social History: Reviewed history from 08/15/2008 and no changes required. Retired Divorced Former Smoker. Quit in 1985.  Smoked aprox 30 years "socially" Alcohol use-twice per wk Drug use-no Regular exercise-yes Lives alone  Review of Systems      See HPI  Physical Exam  General:  Well-developed,well-nourished,in no acute distress; alert,appropriate and cooperative throughout examination Head:  Normocephalic and atraumatic without obvious abnormalities. No apparent alopecia or balding. Eyes:  No corneal or conjunctival inflammation noted. EOMI. Perrla. Funduscopic exam benign, without hemorrhages, exudates or papilledema. Vision grossly normal. Ears:  External ear  exam shows no significant lesions or deformities.  Otoscopic examination reveals clear canals, tympanic membranes are intact bilaterally without bulging, retraction, inflammation or discharge. Hearing is grossly normal bilaterally. Nose:  External nasal examination shows no deformity or inflammation. Nasal mucosa are pink and moist without lesions or exudates. Mouth:  Oral mucosa and oropharynx without lesions or exudates.  Teeth in good  repair. Neck:  No deformities, masses, or tenderness noted. Chest Wall:  No deformities, masses, or tenderness noted. Breasts:  No mass, nodules, thickening, tenderness, bulging, retraction, inflamation, nipple discharge or skin changes noted.   Lungs:  Normal respiratory effort, chest expands symmetrically. Lungs are clear to auscultation, no crackles or wheezes. Heart:  Normal rate and regular rhythm. S1 and S2 normal without gallop, murmur, click, rub or other extra sounds. Abdomen:  Bowel sounds positive,abdomen soft and non-tender without masses, organomegaly or hernias noted. Msk:  No deformity or scoliosis noted of thoracic or lumbar spine.   Pulses:  R and L carotid,radial,femoral,dorsalis pedis and posterior tibial pulses are full and equal bilaterally Extremities:  No clubbing, cyanosis, edema, or deformity noted with normal full range of motion of all joints.   Neurologic:  No cranial nerve deficits noted. Station and gait are normal. Plantar reflexes are down-going bilaterally. DTRs are symmetrical throughout. Sensory, motor and coordinative functions appear intact. Skin:  skin exam normal except for eczema in both lower extremities Cervical Nodes:  No lymphadenopathy noted Axillary Nodes:  No palpable lymphadenopathy Inguinal Nodes:  No significant adenopathy Psych:  Cognition and judgment appear intact. Alert and cooperative with normal attention span and concentration. No apparent delusions, illusions, hallucinations   Impression & Recommendations:  Problem # 1:  CONTACT DERMATITIS&OTHER ECZEMA DUE TO SOLVENTS (ICD-692.2) Assessment Deteriorated  Her updated medication list for this problem includes:    Lidex 0.05 % Crea (Fluocinonide) .Marland Kitchen... Apply at bedtime  Orders: Venipuncture (04540) Prescription Created Electronically [phone]) Medicare -1st Annual Wellness Visit [phone]) Urinalysis-dipstick only (Medicare patient) (95621HY) TLB-BMP (Basic Metabolic Panel-BMET)  (80048-METABOL) TLB-CBC Platelet - w/Differential (85025-CBCD) TLB-Hepatic/Liver Function Pnl (80076-HEPATIC) TLB-TSH (Thyroid Stimulating Hormone) (84443-TSH)  Problem # 2:  HYPERTENSION (ICD-401.9) Assessment: Improved  Her updated medication list for this problem includes:    Cartia Xt 120 Mg Cp24 (Diltiazem hcl coated beads) ..... Once daily    Furosemide 20 Mg Tabs (Furosemide) .Marland Kitchen... 1 once daily  Orders: Venipuncture (86578) Prescription Created Electronically [phone]) Medicare -1st Annual Wellness Visit [phone]) Urinalysis-dipstick only (Medicare patient) [phone]) EKG w/ Interpretation (93000) TLB-BMP (Basic Metabolic Panel-BMET) (80048-METABOL) TLB-CBC Platelet - w/Differential (85025-CBCD) TLB-Hepatic/Liver Function Pnl (80076-HEPATIC) TLB-TSH (Thyroid Stimulating Hormone) (84443-TSH)  Problem # 3:  BREAST CANCER, HX OF (ICD-V10.3) Assessment: Unchanged  Orders: Venipuncture (02725) Prescription Created Electronically [phone]) Medicare -1st Annual Wellness Visit [phone]) Urinalysis-dipstick only (Medicare patient) (25956LO) TLB-BMP (Basic Metabolic Panel-BMET) (80048-METABOL) TLB-CBC Platelet - w/Differential (85025-CBCD) TLB-Hepatic/Liver Function Pnl (80076-HEPATIC) TLB-TSH (Thyroid Stimulating Hormone) (84443-TSH)  Problem # 4:  Preventive Health Care (ICD-V70.0) Assessment: Unchanged  Complete Medication List: 1)  Cartia Xt 120 Mg Cp24 (Diltiazem hcl coated beads) .... Once daily 2)  Klor-con M20 20 Meq Tbcr (Potassium chloride crys cr) .Marland Kitchen.. 1 3 times per wk 3)  Furosemide 20 Mg Tabs (Furosemide) .Marland Kitchen.. 1 once daily 4)  Lidex 0.05 % Crea (Fluocinonide) .... Apply at bedtime 5)  Lumigan 0.03 % Soln (Bimatoprost) .Marland Kitchen.. 1 drop each eye at bedtime 6)  Celebrex 100 Mg Caps (Celecoxib) .... Once daily  Patient Instructions: 1)  begin a walking program 20 minutes daily.  2)  Apply small amounts of Lubriderm and steroid ointment to the eczema.  On your lower extremities  twice daily. 3)  Remember to avoid soap when you Bathe 4)  Take calcium +Vitamin D daily. 5)  Take an Aspirin every day. 6)  Please schedule a follow-up appointment in 1 year. 7)  use  two drops the earwax dissolving drops in each ear canal.  At that time...........Marland Kitchen  Return in two weeks for follow-up Prescriptions: LIDEX 0.05 %  CREA (FLUOCINONIDE) apply at bedtime  #60 gr. x 3   Entered and Authorized by:   Roderick Pee MD   Signed by:   Roderick Pee MD on 01/08/2010   Method used:   Electronically to        [facility]. [phone]* (retail)       [address]       [place], [place]  [address]       Ph: [phone]       Fax: [phone]   RxID:   [id number] FUROSEMIDE 20 MG  TABS (FUROSEMIDE) 1 once daily  #100 x 3   Entered and Authorized by:   Roderick Pee MD   Signed by:   Roderick Pee MD on 01/08/2010   Method used:   Electronically to        [facility]. [phone]* (retail)       [address]       [place], [place]  [address]       Ph: [phone]       Fax: [phone]   RxID:   [id number] KLOR-CON M20 20 MEQ TBCR (POTASSIUM CHLORIDE CRYS CR) 1 3 times per wk  #100 x 3   Entered and Authorized by:   Roderick Pee MD   Signed by:   Roderick Pee MD on 01/08/2010   Method used:   Electronically to        [facility]. [phone]* (retail)       [address]       [place], [place]  [address]       Ph: [phone]       Fax: [phone]   RxID:   [id number] CARTIA XT 120 MG  CP24 (DILTIAZEM HCL COATED BEADS) once daily  #100 x 3   Entered and Authorized by:   Roderick Pee MD   Signed by:   Roderick Pee MD on 01/08/2010   Method used:   Electronically to        [facility]. [phone]* (retail)       [address]       [place], [place]  [address]       Ph: [phone]       Fax: [phone]    RxID:   [id number]    Orders Added: 1)  Venipuncture [24580] 2)  Prescription Created Electronically [phone] 3)  Medicare -1st Annual Wellness Visit [G0438] 4)  Urinalysis-dipstick only (Medicare patient) [81003QW] 5)  EKG w/ Interpretation [93000] 6)  TLB-BMP (Basic Metabolic Panel-BMET) [80048-METABOL] 7)  TLB-CBC Platelet - w/Differential [85025-CBCD] 8)  TLB-Hepatic/Liver Function Pnl [80076-HEPATIC] 9)  TLB-TSH (Thyroid Stimulating Hormone) [84443-TSH]     Laboratory Results   Urine Tests  Date/Time Received: January 08, 2010 9:12 AM   Routine Urinalysis   Color: yellow Appearance: Clear Glucose: negative   (Normal Range: Negative) Bilirubin: negative   (Normal Range: Negative) Ketone: negative   (Normal Range: Negative) Spec. Gravity: 1.010   (Normal Range: 1.003-1.035) Blood: negative   (Normal Range: Negative) pH: 6.0   (Normal Range: 5.0-8.0) Protein: negative   (Normal Range: Negative) Urobilinogen: 0.2   (Normal Range: 0-1) Nitrite: positive   (Normal Range: Negative) Leukocyte Esterace: negative   (Normal Range: Negative)    Comments: Romualdo Bolk, CMA (AAMA)  January 08, 2010 9:13 AM     Appended Document: CPX (PT WILL COME IN FASTING) // RS     Clinical Lists Changes  Observations: Added new observation of BMI: 29.23  (01/13/2010 16:42) Added new observation of WEIGHT: 171 lb (01/13/2010 16:42) Added new observation of HEIGHT: 64.25 in (01/13/2010 16:42)        Vital Signs:  Patient profile:   77 year old female Height:      64.25 inches Weight:      171 pounds BMI:     29.23

## 2010-05-17 ENCOUNTER — Ambulatory Visit (INDEPENDENT_AMBULATORY_CARE_PROVIDER_SITE_OTHER): Payer: Medicare Other | Admitting: Family Medicine

## 2010-05-17 ENCOUNTER — Inpatient Hospital Stay (HOSPITAL_COMMUNITY)
Admission: EM | Admit: 2010-05-17 | Discharge: 2010-05-27 | DRG: 330 | Disposition: A | Discharge status: Home / Self Care | Payer: Medicare Other | Attending: Gastroenterology | Admitting: Gastroenterology

## 2010-05-17 ENCOUNTER — Encounter (HOSPITAL_COMMUNITY): Payer: Self-pay | Admitting: Radiology

## 2010-05-17 ENCOUNTER — Emergency Department (HOSPITAL_COMMUNITY): Payer: Medicare Other

## 2010-05-17 ENCOUNTER — Encounter: Payer: Self-pay | Admitting: Family Medicine

## 2010-05-17 DIAGNOSIS — I2510 Atherosclerotic heart disease of native coronary artery without angina pectoris: Secondary | ICD-10-CM | POA: Diagnosis present

## 2010-05-17 DIAGNOSIS — K56609 Unspecified intestinal obstruction, unspecified as to partial versus complete obstruction: Secondary | ICD-10-CM | POA: Diagnosis present

## 2010-05-17 DIAGNOSIS — E739 Lactose intolerance, unspecified: Secondary | ICD-10-CM | POA: Diagnosis present

## 2010-05-17 DIAGNOSIS — C184 Malignant neoplasm of transverse colon: Principal | ICD-10-CM | POA: Diagnosis present

## 2010-05-17 DIAGNOSIS — I10 Essential (primary) hypertension: Secondary | ICD-10-CM | POA: Diagnosis present

## 2010-05-17 DIAGNOSIS — Z853 Personal history of malignant neoplasm of breast: Secondary | ICD-10-CM

## 2010-05-17 DIAGNOSIS — R109 Unspecified abdominal pain: Secondary | ICD-10-CM | POA: Insufficient documentation

## 2010-05-17 DIAGNOSIS — H9190 Unspecified hearing loss, unspecified ear: Secondary | ICD-10-CM | POA: Diagnosis present

## 2010-05-17 DIAGNOSIS — E873 Alkalosis: Secondary | ICD-10-CM | POA: Diagnosis not present

## 2010-05-17 DIAGNOSIS — K2900 Acute gastritis without bleeding: Secondary | ICD-10-CM | POA: Diagnosis present

## 2010-05-17 DIAGNOSIS — R1011 Right upper quadrant pain: Secondary | ICD-10-CM

## 2010-05-17 DIAGNOSIS — D62 Acute posthemorrhagic anemia: Secondary | ICD-10-CM | POA: Diagnosis present

## 2010-05-17 DIAGNOSIS — E876 Hypokalemia: Secondary | ICD-10-CM | POA: Diagnosis not present

## 2010-05-17 LAB — URINALYSIS, ROUTINE W REFLEX MICROSCOPIC
Glucose, UA: NEGATIVE mg/dL
Hgb urine dipstick: NEGATIVE
Ketones, ur: 15 mg/dL — AB
Protein, ur: NEGATIVE mg/dL

## 2010-05-17 LAB — DIFFERENTIAL
Basophils Relative: 0 % (ref 0–1)
Eosinophils Absolute: 0.1 10*3/uL (ref 0.0–0.7)
Lymphocytes Relative: 15 % (ref 12–46)
Lymphs Abs: 1.7 10*3/uL (ref 0.7–4.0)
Monocytes Absolute: 1 10*3/uL (ref 0.1–1.0)

## 2010-05-17 LAB — COMPREHENSIVE METABOLIC PANEL
AST: 17 U/L (ref 0–37)
Albumin: 3.6 g/dL (ref 3.5–5.2)
Alkaline Phosphatase: 65 U/L (ref 39–117)
BUN: 16 mg/dL (ref 6–23)
Chloride: 98 mEq/L (ref 96–112)
Potassium: 2.8 mEq/L — ABNORMAL LOW (ref 3.5–5.1)
Total Bilirubin: 0.4 mg/dL (ref 0.3–1.2)

## 2010-05-17 LAB — POCT CARDIAC MARKERS: Troponin i, poc: <0.05 ng/mL (ref 0.00–0.09)

## 2010-05-17 LAB — CBC
MCHC: 33.8 g/dL (ref 30.0–36.0)
Platelets: 185 10*3/uL (ref 150–400)
RBC: 4.13 MIL/uL (ref 3.87–5.11)
RDW: 13.9 % (ref 11.5–15.5)

## 2010-05-17 MED ORDER — IOHEXOL 300 MG/ML  SOLN
75.0000 mL | Freq: Once | INTRAMUSCULAR | Status: AC | PRN
  Administered 2010-05-17: 75 mL via INTRAVENOUS

## 2010-05-17 NOTE — Progress Notes (Signed)
  Subjective:    Patient ID: [name], female    DOB: 01-24-1920, 77 y.o.   MRN: [id number]  HPI  [name], 77 year old, divorced female, who comes in today for evaluation of acute abdominal pain.  She states that yesterday around 3 p.m. She ate a fatty meal, and about two hours later, she developed the gradual onset of right upper quadrant and midepigastric abdominal pain.  States had nausea and vomiting x 1.  No fever, chills, or diarrhea.  Her abdomen and also bloated.  She's had no previous history of gallbladder disease.  She came into the office with that history and was unable to walk because of severe pain.    Review of Systems General and GI review of systems otherwise negative    Objective:   Physical Exam Well-developed female acute distress.  The abdomen is bloated.  The bowel sounds were markedly diminished diffuse tenderness.  No rebound       Assessment & Plan:  Acute abdominal pain, most likely acute cholecystitis.  [facility]

## 2010-05-17 NOTE — Patient Instructions (Signed)
Go directly to the emergency room

## 2010-05-18 DIAGNOSIS — R1032 Left lower quadrant pain: Secondary | ICD-10-CM

## 2010-05-18 DIAGNOSIS — R933 Abnormal findings on diagnostic imaging of other parts of digestive tract: Secondary | ICD-10-CM

## 2010-05-18 LAB — CBC
HCT: 32.9 % — ABNORMAL LOW (ref 36.0–46.0)
Hemoglobin: 11.1 g/dL — ABNORMAL LOW (ref 12.0–15.0)
MCV: 97.1 fL (ref 78.0–100.0)
RBC: 3.39 MIL/uL — ABNORMAL LOW (ref 3.87–5.11)
WBC: 10.1 10*3/uL (ref 4.0–10.5)

## 2010-05-18 LAB — BASIC METABOLIC PANEL
BUN: 13 mg/dL (ref 6–23)
Chloride: 104 mEq/L (ref 96–112)
Glucose, Bld: 85 mg/dL (ref 70–99)
Potassium: 3.7 mEq/L (ref 3.5–5.1)

## 2010-05-19 ENCOUNTER — Other Ambulatory Visit: Payer: Self-pay | Admitting: Gastroenterology

## 2010-05-19 DIAGNOSIS — K2210 Ulcer of esophagus without bleeding: Secondary | ICD-10-CM

## 2010-05-19 DIAGNOSIS — K56609 Unspecified intestinal obstruction, unspecified as to partial versus complete obstruction: Secondary | ICD-10-CM

## 2010-05-19 DIAGNOSIS — K208 Other esophagitis: Secondary | ICD-10-CM

## 2010-05-19 DIAGNOSIS — K449 Diaphragmatic hernia without obstruction or gangrene: Secondary | ICD-10-CM

## 2010-05-19 DIAGNOSIS — K5730 Diverticulosis of large intestine without perforation or abscess without bleeding: Secondary | ICD-10-CM

## 2010-05-19 DIAGNOSIS — K222 Esophageal obstruction: Secondary | ICD-10-CM

## 2010-05-19 DIAGNOSIS — R933 Abnormal findings on diagnostic imaging of other parts of digestive tract: Secondary | ICD-10-CM

## 2010-05-19 DIAGNOSIS — K5790 Diverticulosis of intestine, part unspecified, without perforation or abscess without bleeding: Secondary | ICD-10-CM

## 2010-05-19 DIAGNOSIS — C189 Malignant neoplasm of colon, unspecified: Secondary | ICD-10-CM

## 2010-05-19 HISTORY — DX: Ulcer of esophagus without bleeding: K22.10

## 2010-05-19 HISTORY — DX: Diaphragmatic hernia without obstruction or gangrene: K44.9

## 2010-05-19 HISTORY — DX: Diverticulosis of intestine, part unspecified, without perforation or abscess without bleeding: K57.90

## 2010-05-20 ENCOUNTER — Other Ambulatory Visit: Payer: Self-pay | Admitting: General Surgery

## 2010-05-20 HISTORY — PX: COLON RESECTION: SHX5231

## 2010-05-20 LAB — SURGICAL PCR SCREEN: MRSA, PCR: NEGATIVE

## 2010-05-21 LAB — BASIC METABOLIC PANEL
CO2: 28 mEq/L (ref 19–32)
GFR calc non Af Amer: >60 mL/min (ref >60)
Glucose, Bld: 171 mg/dL — ABNORMAL HIGH (ref 70–99)
Potassium: 3.2 mEq/L — ABNORMAL LOW (ref 3.5–5.1)
Sodium: 139 mEq/L (ref 135–145)

## 2010-05-21 LAB — CBC
HCT: 31.4 % — ABNORMAL LOW (ref 36.0–46.0)
Hemoglobin: 10.7 g/dL — ABNORMAL LOW (ref 12.0–15.0)
RDW: 13.9 % (ref 11.5–15.5)
WBC: 16 10*3/uL — ABNORMAL HIGH (ref 4.0–10.5)

## 2010-05-21 NOTE — Assessment & Plan Note (Signed)
[facility]                                   ON-CALL NOTE   [name], [name]                     MRN:          [id number]  DATE:08/14/2005                            DOB:          09-17-1920    A patient of Dr. Tawanna Cooler.   Phone call comes from Westley Chandler, her daughter, at [phone], on August 12th  at 1:30 p.m.  [name] has been having numerous problems, a couple of  emergency room visits and some hospital stays, worrying about her heart.  She just had a nuclear stress test on Friday which apparently was okay, but  they are not 100% sure yet, and daughter calls because she had been doing  fine earlier and then suddenly kind of having a shaking spell, maybe a  little bit of chest pressure but nothing that is striking and feeling uneasy  and shaky, not really short of breath.  She calls for advice.   PLAN:  Her medicines have mostly been stopped and so I think it may be best  to see what happens over the next few minutes, since she seems like she is  just anxious or nervous about things.  If her symptoms do not clear up, I  suggested to her daughter to call 911 to have her evaluated in the emergency  room.  She was hoping to calm her down because she really did not want to  have to do that again.                                   Karie Schwalbe, MD   RIL/MedQ  DD:  08/14/2005  DT:  08/15/2005  Job #:  [id number]   cc:   Tinnie Gens A. Tawanna Cooler, MD

## 2010-05-21 NOTE — Op Note (Signed)
[place]  Patient:    Chelsea, Mills                     MRN: [id number] Proc. Date: 10/27/99 Adm. Date:  [id number] Attending:  Charlton Haws CC:         Evette Georges, M.D. [facility]   Operative Report  PREOPERATIVE DIAGNOSIS:  Invasive lobular carcinoma right breast, upper/outer quadrant.  POSTOPERATIVE DIAGNOSIS: Invasive lobular carcinoma right breast, upper/outer quadrant.  OPERATION:  Needle-guided right lumpectomy with blue dye injection and sentinel node dissection.  SURGEON:  Currie Paris, M.D.  ANESTHESIA:  General endotracheal.  CLINICAL HISTORY:  This patient is a 77 year old, who recently presented, who has an abnormal mammogram and a large core biopsy, which had shown invasive lobular carcinoma.  DESCRIPTION OF PROCEDURE:  Patient brought to the operating room and after satisfactory general anesthesia had been obtained, the breast was prepped and draped.  I injected about 5 cc of Lymphazurin blue subareolarly and waited about 5 minutes.  In the meantime, I used the neoprobe to identify a hot area in the axilla.  This was directly over a soft, palpable node that I had noted preoperatively.  An incision was made in the axilla and as soon as I got through the subcutaneous tissues, found a blue lymphatic, which was tracing towards the area that had been noted to be hot and with a little dissection, I found a blue node that was consistent with the one that I had palpated preoperatively. It was quite hot and was excised.  Once this was excised, I saw no other blue nodes, could palpate no other enlarged nodes and found no areas with increased radioactive counts.  A pack was placed and attention turned to the breast.  The guidewire entered laterally and tracked medially and I made a transverse incision around the guidewire to include the old biopsy site.  I raised some flaps and took out a wide area around the  guidewire going down to the chest wall.  The tumor was palpable as it came out and I thought I had a small margin both superiorly and deep.  I could not get any more deep margin, but went back and took another cm of tissue right where the tumor appeared to be closest.  The wound was checked for hemostasis, which was achieved with cautery or suture ligatures.  I irrigated everything was dry and injected about 10 cc of 0.25% Marcaine and put four metal clips to mark the edges of excision.  The lumpectomy site was similarly irrigated to check for hemostasis and then infiltrated with some Marcaine and closed with 3-0 Vicryl, followed by 4-0 Monocryl subcuticular plus Steri-Strips.  The breast itself was closed with some staples.  Pathology showed the sentinel node to be negative and the margin appeared to be adequate, although close deep.  All counts were correct. The patient was taken to recovery room in satisfactory condition. DD:  10/27/99 TD:  10/27/99 Job: [id number]

## 2010-05-21 NOTE — Discharge Summary (Signed)
NAMEAIMAN, [name]              ACCOUNT NO.:  [id number]   MEDICAL RECORD NO.:  [id number]          PATIENT TYPE:  INP   LOCATION:  2041                         FACILITY:  [facility]   PHYSICIAN:  Rollene Rotunda, M.D.   DATE OF BIRTH:  11/24/1920   DATE OF ADMISSION:  08/02/2005  DATE OF DISCHARGE:  08/04/2005                                 DISCHARGE SUMMARY   CONSULTATIONS:  1. [name], M.D. [facility]  2. Rene Paci, M.D. [facility]   REASON FOR ADMISSION:  Dyspnea.   DISCHARGE DIAGNOSES:  1. Dyspnea - etiology unclear (likely multifactorial).  2. Abnormal chest CT - outpatient pulmonary evaluation pending.  3. Anemia - outpatient gastroenterology workup pending.  4. Diastolic dysfunction.  5. Nonobstructive coronary disease.      a.     Catheterization 2006:  LAD 40%, followed by 60% stenosis,       followed by 50% stenosis; first diagonal 60% to 70%, followed by 50%;       circumflex free of disease, RCA free of disease.  6. Good left ventricle function.  7. Hypertension.  8. History of breast cancer status post lumpectomy.  9. Status post abdominal hysterectomy with bilateral oophorectomy.  10.Status post appendectomy.  11.Status post bilateral shoulder repair.  12.Mild hyperglycemia - likely secondary to recent epidural steroid      injections.  13.Hyponatremia - mild (likely secondary to diuretic use).  14.History of colon polyps.  15.History of diverticulosis.   BRIEF HISTORY:  [name] is an 77 year old female who presented to the  office on August 02, 2005 to Dr. Antoine Poche for further evaluation of dyspnea.  She had a recent episode of unresponsiveness on July 21, 2005 and had gone  to the emergency room.  She apparently ruled out and was discharged home.  She had also been treated recently for urinary tract infection.  She also  had some recent hypotension and had her Cardizem discontinued, but this was  restarted secondary to tachycardia.  She continued to have  breathlessness,  confusion, and increased heart rate as well as chest pressure.  She was  admitted for further evaluation.   HOSPITAL COURSE:  The patient was sent for spiral chest CT to rule out  pulmonary embolus.  This showed no evidence for pulmonary embolus.  There  was asymmetric bi-apical opacity, right greater than the left, probably  related to scarring, although neoplasm could have this appearance.  There is  also diffuse interstitial and patchy ground-glass attenuation of both lungs  suggesting a component of underlying pulmonary edema.  Her cardiac enzymes  were negative.  Her BNP was mildly elevated at 139.  She under went  echocardiogram, August 03, 2005, that revealed an EF of 55% to 60%.  No left  ventricular wall motion abnormalities.  Mild asymmetric septal hypertrophy.  Grade 1 diastolic dysfunction.  Trivial aortic regurgitation.  Mildly  dilated left atrium.  Mild to moderated tricuspid regurgitation.  Prominent  epicardial adipose tissue.  Internal medicine was asked to evaluate the  patient given her anemia (hemoglobin 9.6, MCV 102) as well as her abnormal  chest CT.  Dr. Felicity Coyer saw the patient.  She felt there was no clear  measured finding to explain her dyspnea and it may well be multifactorial.  Due to her anemia, she was placed on a proton pump inhibitor.  Dr. Felicity Coyer  arranged for the patient to be seen as an outpatient with Dr. Delton Coombes for  pulmonology evaluation.  Dr. Antoine Poche felt that the patient was stable  enough for discharge to home, from a cardiac perspective.  Dr. Felicity Coyer  arranged for gastroenterology to see the patient in the hospital.  Dr. [name] saw the patient on August 04, 2005 and arranged for her to have an  outpatient evaluation for her anemia.  The patient was discharged to home in  stable condition on August 04, 2005.   LABORATORY AND ANCILLARY DATA:  Discharge white count 7500, hemoglobin 9.8,  hematocrit 28.9, platelet count  299,000, MCV 100.4.  INR 1.  Sodium 133,  potassium 3.6, chloride 96, CO2 30, glucose 94, BUN 15, creatinine 0.8.  Calcium 8.4, total protein 5.8, albumin 3.  AST 26, ALT 18, alkaline  phosphatase 49, total bilirubin 0.4.  Cardiac markers negative x3.  BNP 139,  TSH 1.573.  Iron 36, TIBC 226, percent saturation 16.  Ferritin 152.   Chest x-ray:  COPD and scarring, no acute abnormalities.  Echocardiogram:  As noted above.  Chest CT:  As noted above.   DISCHARGE MEDICATIONS:  1. Nitroglycerin patch 0.2 mg daily.  2. Lasix 20 mg b.i.d.  3. Iron b.i.d.  4. The patient has been advised to hold her diltiazem and aspirin for now.   DIET:  Low fat, low sodium.   FOLLOWUP:  The patient will undergo Myoview scan on August 12, 2005 at 9:00  a.m.  She will follow up with Dr. Antoine Poche August 16, 2005 at 10:30 a.m.  She will see Dr. Delton Coombes on August 25, 2005.  She will follow up with Dr.  Juanda Chance as directed.   Total physician, P.A. time greater than 30 minutes.      Tereso Newcomer, P.A.    ______________________________  Rollene Rotunda, M.D.    SW/MEDQ  D:  08/16/2005  T:  08/16/2005  Job:  [id number]   cc:   Tinnie Gens A. Tawanna Cooler, M.D. [facility]  [name], M.D. [facility]  Rene Paci, M.D. [facility]

## 2010-05-21 NOTE — Cardiovascular Report (Signed)
NAME:  JASMAN, PFEIFLE NO.:  [id number]   MEDICAL RECORD NO.:  [id number]          PATIENT TYPE:  OIB   LOCATION:  6501                         FACILITY:  [facility]   PHYSICIAN:  Arvilla Meres, M.D. LHCDATE OF BIRTH:  21-Mar-1920   DATE OF PROCEDURE:  DATE OF DISCHARGE:  06/30/2004                              CARDIAC CATHETERIZATION   ADDENDUM:  I have reviewed the catheterization films with Dr. Samule Ohm, one of our  interventionalists, and he agrees that the lesions in the LAD and the  diagonal do not appear to be flow limiting and would pursue medical therapy  at this time.       DB/MEDQ  D:  06/30/2004  T:  06/30/2004  Job:  [id number]

## 2010-05-21 NOTE — Assessment & Plan Note (Signed)
[facility]                            CARDIOLOGY OFFICE NOTE   [name], [name]                     MRN:          [id number]  DATE:02/07/2006                            DOB:          01-Aug-1920    REFERRING PHYSICIAN:  Tinnie Gens A. Tawanna Cooler, MD   REASON FOR PRESENTATION:  Patient with chest discomfort and shortness of  breath.   HISTORY OF PRESENT ILLNESS:  Patient is 77 years old.  She returns for  followup of the above.  We ultimately came to a diagnosis of probably  panic with her severe episodes as described in previous notes.  She did  see a psychiatrist.  She tried, apparently, an antidepressant or  anxiolytic, but does not recall the name.  She had some hair loss, and  so stopped this under Dr. Nelida Meuse direction.  She has had no further  episodes of chest pain or tachy palpitations.  She has had no presyncope  or syncope.  She has had no shortness of breath.   PAST MEDICAL HISTORY:  1. Coronary artery disease (nonobstructive, see the August 16, 2005      note for details).  2. Hypertension.  3. Anemia.  4. Breast cancer, status post lumpectomy.  5. Hysterectomy with oophorectomy.  6. Appendectomy.  7. Bilateral shoulder repair.   ALLERGIES:  1. CODEINE.  2. SULFA.  3. PENICILLIN.  4. INDOCIN.   CURRENT MEDICATIONS:  1. Lasix 20 mg daily.  2. Nitroglycerin patch 0.2 mg daily.  3. Iron 300 mg daily.  4. Cardizem 120 mg daily.   REVIEW OF SYSTEMS:  As stated in the HPI, and otherwise negative for  other systems.   PHYSICAL EXAMINATION:  Patient is in no distress.  Blood pressure 144/89.  Heart rate 82 and regular.  Weight 156 pounds.  Body mass index 24.  HEENT:  Eyes are unremarkable.  Pupils are equal, round and reactive to  light.  Fundi not visualized.  Oral mucosa unremarkable.  NECK:  No jugular venous distention.  Wave form within normal limits.  Carotid upstroke brisk and symmetric.  No bruits.  No thyromegaly.  LYMPHATICS:  No adenopathy.  LUNGS:  Clear auscultation bilaterally.  BACK:  No costovertebral angle tenderness.  CHEST:  Unremarkable.  HEART:  PMI not displaced or sustained.  S1 and S2 within normal limits.  No S3.  No S4.  No clicks, no rubs, no murmurs.  ABDOMEN:  Flat.  Positive bowel sounds.  Normal frequency and pitch.  No  bruits.  No rebound.  No guarding.  No midline pulsatile masses or  organomegaly.  SKIN:  No rashes.  No nodules.  EXTREMITIES:  Two plus pulses.  No edema, no cyanosis, no clubbing.  NEUROLOGIC:  Grossly intact.   EKG:  Sinus rhythm.  Axis within normal limits.  Intervals within normal  limits.  No acute ST or T wave changes.   ASSESSMENT AND PLAN:  1. Chest discomfort and tachy palpitations.  The patient has had no      further episodes.  At this point, no further cardiac workup is  suggested.  2. Followup will be in this clinic as needed.     Rollene Rotunda, MD, [facility]  Electronically Signed    JH/MedQ  DD: 02/07/2006  DT: 02/07/2006  Job #: [id number]   cc:   Tinnie Gens A. Tawanna Cooler, MD

## 2010-05-21 NOTE — H&P (Signed)
[facility] ADMISSION   NAME:Mills, Chelsea FOWERS                     MRN:          [id number]  DATE:08/02/2005                            DOB:          09-Mar-1920    REFERRING PHYSICIAN:  Tinnie Gens A. Tawanna Cooler, M.D.   REASON FOR REFERRAL:  Evaluate patient with shortness of breath and  tachycardia.   HISTORY OF PRESENT ILLNESS:  The patient is a lovely 77 year old white  female, whom I have evaluated in the past for chest pain.  She had a cardiac  catheterization, as described below.  She has been doing relatively well  since I saw her in 2006.  She did have some chest discomfort at that time  that seemed to resolve.  On July 19, she was with her daughter and developed  an unresponsive episode.  Her daughter described that she turned gray, she  was foaming at the mouth.  This lasted for several minutes.  She did not  respond and was cool to touch.  She did go to the emergency room.  She was  treated with IV fluids.  It looks like she ruled out with negative cardiac  enzymes and was discharged home.  Around that time, she had been treated for  urinary tract infection and this seems to have resolved, as she is no longer  having dysuria.  She has had hypotension and actually had to have Cardizem  discontinued.  She, however, has remained tachycardic and had this restarted  by Dr. Tawanna Cooler.  She also had nitroglycerin patch, held briefly.  Over the last  several days, she has continued to do poorly with breathlessness, confusion,  increased heart rate.  Today, she saw Dr. Tawanna Cooler and was noted to be  tachycardic with an irregular rate.  This turns out to be sinus rhythm with  premature ventricular complexes.  She has had a continued chest pressure  that has been constant.  She has been noted to have a new mild anemia and  was taken off the aspirin the last several days.  She has had some nausea,  but not fevers or chills.  She  has had no PND or orthopnea.  She has had no  neck or arm discomfort.   PAST MEDICAL HISTORY:  Nonobstructive coronary disease (catheterization 2006  with LAD 40% stenosis, followed by 60% stenosis, followed by 50% stenosis;  first diagonal had 60-70% stenosis, followed by 50% stenosis; circumflex was  free of disease; the right coronary artery was free of disease; the EF was  55%; she was managed medically), hypertension, breast cancer.   PAST SURGICAL HISTORY:  Lumpectomy, hysterectomy, oophorectomy,  appendectomy, bilateral shoulder repair.   ALLERGIES:  Codeine, sulfa, penicillin and Indocin.   MEDICATIONS:  1.  Lasix 20 mg daily.  2.  Cardizem 240 mg daily.  3.  Nitroglycerin patch 0.2 mg daily.  4.  Iron 300 mg daily.   SOCIAL HISTORY:  The patient is divorced.  She has four children.  She  worked for the state.   FAMILY HISTORY:  Noncontributory  for early coronary disease.  Her mother  died of congestive heart failure.   REVIEW OF SYSTEMS:  As stated in the HPI, otherwise negative for other  systems.   PHYSICAL EXAMINATION:  GENERAL:  The patient is in no distress.  VITAL SIGNS:  Blood pressure 118/84, heart rate 112 and regular, weight 154  pounds.  HEENT:  Eyes are unremarkable.  Pupils were equal, round and reactive to  light.  Fundi not visualized.  Oral mucosa unremarkable.  NECK:  No jugular venous distention.  Wave form within normal limits.  Carotid upstroke brisk and symmetrical, no bruits.  No thyromegaly.  LYMPHATICS:  No cervical, axillary or inguinal adenopathy.  LUNGS:  Clear to auscultation bilaterally.  BACK:  No costovertebral angle tenderness.  CHEST:  Unremarkable.  HEART:  PMI not displaced or sustained, S1 and S2 within normal limits, no  S3, no S4, no murmurs.  ABDOMEN:  Obese, positive bowel sounds, normal frequency, pitch.  No bruits,  rebound, guarding in the midline, pulses or masses, no hepatomegaly, no  splenomegaly.  SKIN:  No rashes,  no nodules.  EXTREMITIES:  2+ pulses throughout, no edema, no cyanosis or clubbing.  NEUROLOGIC:  Oriented to person, place and time.  Cranial nerves II through  XII grossly intact.  Motor grossly intact.   EKG:  Sinus tachycardia, rate 112, premature ventricular contractions, left  ventricular hypertrophy, no acute STT-wave changes.   ASSESSMENT AND PLAN:  1.  Dyspnea:  The patient has profound dyspnea.  She seemed to have an acute      event a few weeks ago.  I am not sure of the etiology.  She had taken a      long car trip prior to this.  I am going to admit her to the hospital.      I am going to cycle enzymes, though we may have missed an event, if it      occurred.  We are going to check an echocardiogram.  I am going to rule      out pulmonary embolism with a spiral CT.  She will be kept on telemetry.      We will keep careful I&O and treat with diuretics as necessary.  2.  Chest discomfort:  She is having some chest discomfort.  We will rule      out an MI.  We will consider repeat cardiac catheterization, versus      stress perfusion imaging.                                   Rollene Rotunda, MD, [facility]  JH/MedQ  DD:  08/02/2005  DT:  08/02/2005  Job #:  [id number]   cc:   Tinnie Gens A. Tawanna Cooler, MD

## 2010-05-21 NOTE — Cardiovascular Report (Signed)
NAME:  [name], [name] NO.:  [id number]   MEDICAL RECORD NO.:  [id number]          PATIENT TYPE:  OIB   LOCATION:  6501                         FACILITY:  [facility]   PHYSICIAN:  Arvilla Meres, M.D. LHCDATE OF BIRTH:  [birth year]   DATE OF PROCEDURE:  06/30/2004  DATE OF DISCHARGE:                              CARDIAC CATHETERIZATION   PRIMARY CARE PHYSICIAN:  Tinnie Gens A. Tawanna Cooler, M.D.   PRIMARY CARDIOLOGIST:  Doylene Canning. Ladona Ridgel, M.D.   PATIENT IDENTIFICATION:  [name] is a delightful 77 year old woman with a  history of hypertension and remote breast cancer, who was evaluated in our  office for a two-month history of stable exertional angina and dyspnea.  She  was referred for diagnostic heart catheterization.   PROCEDURES PERFORMED:  1.  Selective coronary angiography.  2.  Left heart catheterization.  3.  Left ventriculogram.  4.  Abdominal aortogram.   DESCRIPTION OF PROCEDURE:  The risks and benefits of the procedure were  explained to [name].  Consent was signed and placed on the chart.   A 4-French arterial sheath was placed in the right femoral artery using a  modified Seldinger technique.  Standard catheters, including a JR-4, JL-4  and angled pigtail were used for the catheterization.  All catheter  exchanges were made over a wire.  At the end of the procedure the patient  was transferred to the holding area in stable condition.  There were no  apparent complications.   FINDINGS:  1.  Central aortic pressure:  150/70, with a mean of 104.  2.  Left ventricular pressure:  167/6 with mean 15.  3.  There was no significant gradient on aortic valve pullback.   CORONARY ANATOMY:  1.  LEFT MAIN:  A very short vessel, with nearly separate ostia for the LAD      and left circumflex though both filled with a single injection.  2.  LEFT ANTERIOR DESCENDING ARTERY:  A large vessel, wrapping the apex.  It      gave off a very large first diagonal and a  small second diagonal branch.      In the ostial LAD there was a 40% focal stenosis.  This was followed by      a long portion of calcification.  Just after the takeoff of the large      diagonal, there was a 60% stenosis.  Following this, there was a tubular      long 30-40% stenosis spanning down to the second diagonal.  After the      second diagonal there was 50% focal stenosis.  In a large first diagonal      there was a 60-70% ostial lesion, followed by a long tubular 50% lesion.  3.  LEFT CIRCUMFLEX:  A large vessel that gave off tiny OM1 and a large      branching OM2.  The AV groove circumflex was small.  There was no      angiographic coronary disease.  4.  RIGHT CORONARY ARTERY:  A large, dominant vessel that gave off a large  PDA, a small PL1 and a large PL2.  There was no significant coronary      disease.   LEFT VENTRICULOGRAPHY:  Done in the RAO approach, showed an EF of 55% with  no significant wall motion abnormalities.  There was no significant mitral  regurgitation.   ABDOMINAL AORTOGRAM:  Showed an irregularity in the left renal artery, but  no significant stenosis.  There was mild plaque in the distal abdominal  aorta.   ASSESSMENT:  Borderline coronary artery disease in the left anterior  descending artery and first diagonal branch.  This does not appear to be  flow-limiting, and doubtful that this is actually causing her symptoms.  Otherwise, coronaries look quite good with a normal LV function.   DISCUSSION:  I will review the cineangiographic films with Drs. Samule Ohm and  [employer].  I think she will likely do quite well with medical therapy though  given her degree of symptoms, could consider adenosine Cardiolite to rule  out ischemia in the anterior wall.  Would also consider workup of possible  other etiologies for her chest pain and dyspnea; such as possible CT scan of  the chest to rule out pulmonary embolus.   ADDENDUM:  I have reviewed the  catheterization films with Dr. Samule Ohm, one of our  interventionalist, and he agrees that the lesions in the LAD and the  diagonal do not appear to be flow limiting and would recommend medical  therapy.       DB/MEDQ  D:  06/30/2004  T:  06/30/2004  Job:  [id number]   cc:   Tinnie Gens A. Tawanna Cooler, M.D. [facility]   Doylene Canning. Ladona Ridgel, M.D.

## 2010-05-21 NOTE — Assessment & Plan Note (Signed)
[facility]                              CARDIOLOGY OFFICE NOTE   ELA, MOFFAT                     MRN:          [id number]  DATE:08/16/2005                            DOB:          [birth year]    PRIMARY CARE PHYSICIAN:  Tinnie Gens A. Tawanna Cooler, MD   REASON FOR PRESENTATION:  Evaluate patient with palpitations and shortness  of breath.   HISTORY OF PRESENT ILLNESS:  The patient returns for followup after a  hospitalization.  I saw her in the office because she had described a  profound episode of altered consciousness.  She was tachycardic and  dyspneic.  In the hospital she had an echocardiogram demonstrating an EF of  60%.  No wall motion abnormalities.  There was mild asymmetric septal  hypertrophy.  There was some evidence of diastolic dysfunction.  There were  no significant valvular abnormalities.  She had some mild pulmonary  hypertension.  After discharge, she had a stress perfusion study that  demonstrated an EF of 58% with no evidence of ischemia or infarct.  She is  noted to have premature ventricular contractions.  She did have a CT scan of  her chest that demonstrated no evidence of a pulmonary emboli or aortic  dissection.  She was monitored with telemetry and was found to have sinus  tachycardia.  She was also seen by GI for evaluation of anemia and is to  have an outpatient evaluation.  In short, there was no clear etiology to her  outpatient episode.   She comes back today for followup and reports that on Sunday, she did have  another episode.  In fact, when I walked into the office today, she was  breathing rapidly and having to lie back.  She was not describing any chest  pressure, chest discomfort, neck discomfort or arm discomfort.  She was  predominantly complaining of feeling tingling all over.  She was somewhat  tremulous.  She simply felt bad and like she had to lay back.  We  immediately checked an EKG, which  demonstrated no acute ST segment changes  but did demonstrate frequent PVCs in a quadrigeminy pattern.  Her oxygen  saturation was 98%.  Her blood pressure, which had been slightly elevated at  164/101, was slightly reduced at 152/90.  Her physical examination, as  described below, was unremarkable during this event.  Slowly these symptoms  resolved without intervention.  By the time she left the office, she was  actually back to baseline and felt well.   Since that time, she has not had any chest discomfort, neck discomfort, arm  discomfort, activity-induced nausea, vomiting, or excessive diaphoresis.  She has not noticed any palpitations.  She has had no PND or orthopnea.  She  has had no further presyncope or syncope.   PAST MEDICAL HISTORY:  1. Nonobstructive coronary disease (catheterization in 2006 with an LAD of      40 % stenosis, followed by 60% stenosis, followed by 50%.  The first      diagonal 60-70%, followed by 50% stenosis.  Circumflex free of disease.  The right coronary artery free of disease.  EF 55%.  2. Hypertension.  3. Breast cancer.  4. Lumpectomy.  5. Hysterectomy.  6. Oophorectomy.  7. Appendectomy.  8. Bilateral shoulder repair.   ALLERGIES:  CODEINE, SULFA, PENICILLIN, INDOCIN.   MEDICATIONS:  1. Iron.  2. A nitroglycerin patch 0.4 mg daily.  3. Lasix 20 mg daily.   REVIEW OF SYSTEMS:  As stated in the HPI, otherwise negative for other  systems.   PHYSICAL EXAMINATION:  GENERAL:  Patient is in no distress.  VITAL SIGNS:  Blood pressure 164/101, heart rate 86 with ectopy.  Weight 155  pounds.  NECK:  No jugular venous distention.  Wave form within normal limits.  Carotid upstroke brisk and symmetric.  No bruits, no thyromegaly.  LYMPHATICS:  No cervical, axillary, or inguinal adenopathy.  LUNGS:  Clear to auscultation bilaterally.  BACK:  No costovertebral angle tenderness.  CHEST:  Unremarkable.  HEART:  PMI not displaced or sustained.  S1  and S2 within normal limits.  No  S3, no S4, no murmurs.  ABDOMEN:  Flat, positive bowel sounds.  Normal in frequency and pitch.  No  bruits, rebound, guarding.  There are no midline pulsatile masses.  No  hepatomegaly, splenomegaly.  SKIN:  No rashes, no nodules.  EXTREMITIES:  Pulses 2+ throughout.  No cyanosis, no clubbing, no edema.  NEUROLOGIC:  Alert and oriented to person, place, and time.  Cranial nerves  II-XII grossly intact.  Motor grossly intact.   EKG:  Sinus rhythm, premature ventricular contractions, axis within normal  limits, intervals within normal limits, mildly prolonged Q-T interval.   ASSESSMENT/PLAN:  1. Shortness of breath, dizziness:  The patient has had an extensive      workup, as described, for these events.  At this point, I am not      planning any cardiac etiology.  I strongly did not feel that this was      related to obstructive coronary disease, as there is absolutely no      evidence of ischemia, and she had a negative stress perfusion study.      She does have premature ventricular contractions but no other      tachycardic or other arrhythmia that would induce these.  While she is      well, she has the premature ventricular contractions, and she is not      feeling these; therefore, I do not think they are related.  She does      have an elevated blood pressure.  I am going to restart Cardizem, which      had been reduced to 120 mg a day and then held in the hospital.  I am      also going to check a BMET today.  We could not get a blood sugar in      the office, and I wanted to see if her blood sugar is low.  I am going      to check a 24-hour urine for metanephrines and BMA.  I have also called      and discussed this whole scenario and workup with Dr. Tawanna Cooler.  He is      going to see her on Thursday for further evaluation.  If she has any      acute episodes with significant distress, her family has managed this     conservatively but is  instructed to bring her to the ER if there is any  distress.  2. Coronary disease:  She needs continued risk reduction.  I will continue      the nitrates for now but may discontinue this going forward.  She will      have her lipids followed by Dr. Tawanna Cooler.  3. Shortness of breath:  She did have some shortness of breath that      responded to diuretics.  There is some evidence of diastolic      dysfunction.  I would continue this amount of Lasix and will check a      BMET.  4. Followup:  I will see her back in two weeks.  Again, she is seeing Dr.      Tawanna Cooler in two days.                               Rollene Rotunda, MD, [facility]    JH/MedQ  DD:  08/16/2005  DT:  08/16/2005  Job #:  [id number]   cc:   Tinnie Gens A. Tawanna Cooler, MD

## 2010-05-21 NOTE — Assessment & Plan Note (Signed)
[facility]                              CARDIOLOGY OFFICE NOTE   [name], [name]                     MRN:          [id number]  DATE:09/01/2005                            DOB:          1920-04-30    PRIMARY CARE PHYSICIAN:  Tinnie Gens A. Tawanna Cooler, MD   REASON FOR PRESENTATION:  Patient with chest discomfort and shortness of  breath.   HISTORY OF PRESENT ILLNESS:  The patient returns for followup.  When I last  saw [name] in the office, [name] had a spell of severe shortness of breath.  At  that time, there was no objective evidence of any abnormality.  I eventually  suggested this could have been a panic attack.  [name] saw Dr. Tawanna Cooler and has  been put on citalopram.  [name] is being referred for psychiatry evaluation.  [name] has had no further spells since that time.  [name] has had no chest  pressure, neck discomfort, arm discomfort, activity-induced nausea or  vomiting, excessive diaphoresis.  Has had no palpitations, presyncope or  syncope.   PAST MEDICAL HISTORY:  1. Coronary artery disease (nonobstructive, see August 16, 2005 note for      details).  2. Hypertension.  3. Anemia.  4. Breast cancer, status post lumpectomy.  5. Hysterectomy.  6. Oophorectomy.  7. Appendectomy.  8. Bilateral shoulder repair.   ALLERGIES:  1. CODEINE.  2. SULFA.  3. PENICILLIN.  4. INDOCIN.   MEDICATIONS:  1. Citalopram.  2. Cardizem 120 mg a day.  3. Iron.  4. Nitroglycerine patch.  5. Lasix 20 mg b.i.d..   REVIEW OF SYSTEMS:  As stated in the HPI.  Otherwise negative review of  systems.   PHYSICAL EXAMINATION:  GENERAL:  The patient is in no distress.  VITAL SIGNS:  Blood pressure 136/87, heart rate 80 and regular, weight 160  pounds.  HEENT:  Eyes unremarkable.  Pupils equal, round and reactive to light, fundi  not visualized, oral mucosa unremarkable.  NECK:  No jugular venous distention.  Waveform within normal limits.  Carotid upstroke brisk and  symmetric.  No bruits, thyromegaly.  LYMPHATICS:  No cervical, axillary or inguinal adenopathy.  LUNGS:  Clear to auscultation bilaterally.  BACK:  No costovertebral angle tenderness.  CHEST:  Unremarkable.  HEART:  PMI not displaced, it was sustained.  S1 and S2 within normal  limits.  No S3, no S4, no murmurs.  ABDOMEN:  Flat, positive bowel sounds, normal in frequency and pitch, no  bruits, no rebound or guarding, no midline pulsatile mass, no organomegaly.  SKIN:  No rashes, no nodules.  EXTREMITIES:  2+ pulses.  No edema, no cyanosis, no clubbing.  There is a  skin tear in the wound over [name] left pretibial where [name] had a little trauma  getting off [name] treadmill.  NEURO:  Oriented to person, place and time.  Cranial nerves II-XII grossly  intact.  Motor grossly intact.   ASSESSMENT/PLAN:  1. Dyspnea/chest discomfort.  The patient has now had an extensive workout      to include a CT and a Cardiolite recently.  I do not see a cardiac      etiology to these events.  The first event described by [name] daughter      sounds different than the subsequent event, which I do believe to be      panic.  [name] has had no severe episodes in the last two weeks.  At this      point, no further cardiovascular testing is suggested.  2. Coronary disease.  [name] does have nonobstructive coronary disease and we      will continue with risk reduction.  I would like to see [name] back      because of this.  3. Anemia.  Per Dr. Verlee Monte Brodie's request, I drew an iron, TIBC and      reticulocyte count.  I will refer this to Dr. Juanda Chance.  4. Followup.  I will see [name] back in about four months or sooner if      needed.                               Rollene Rotunda, MD, [facility]    JH/MedQ  DD:  09/01/2005  DT:  09/02/2005  Job #:  [id number]   cc:   Tinnie Gens A. Tawanna Cooler, MD

## 2010-05-22 LAB — BASIC METABOLIC PANEL
CO2: 30 mEq/L (ref 19–32)
Calcium: 8.1 mg/dL — ABNORMAL LOW (ref 8.4–10.5)
Chloride: 103 mEq/L (ref 96–112)
Glucose, Bld: 140 mg/dL — ABNORMAL HIGH (ref 70–99)
Sodium: 139 mEq/L (ref 135–145)

## 2010-05-22 LAB — CBC
HCT: 29.8 % — ABNORMAL LOW (ref 36.0–46.0)
Hemoglobin: 10.1 g/dL — ABNORMAL LOW (ref 12.0–15.0)
MCHC: 33.9 g/dL (ref 30.0–36.0)
RBC: 3.09 MIL/uL — ABNORMAL LOW (ref 3.87–5.11)

## 2010-05-23 ENCOUNTER — Inpatient Hospital Stay (HOSPITAL_COMMUNITY): Payer: Medicare Other

## 2010-05-23 LAB — CBC
HCT: 33.1 % — ABNORMAL LOW (ref 36.0–46.0)
Hemoglobin: 11 g/dL — ABNORMAL LOW (ref 12.0–15.0)
MCH: 32 pg (ref 26.0–34.0)
MCHC: 33.2 g/dL (ref 30.0–36.0)
MCV: 96.2 fL (ref 78.0–100.0)

## 2010-05-23 LAB — BASIC METABOLIC PANEL
CO2: 30 mEq/L (ref 19–32)
Chloride: 100 mEq/L (ref 96–112)
Potassium: 2.8 mEq/L — ABNORMAL LOW (ref 3.5–5.1)
Sodium: 138 mEq/L (ref 135–145)

## 2010-05-23 LAB — CARDIAC PANEL(CRET KIN+CKTOT+MB+TROPI): CK, MB: 2 ng/mL (ref 0.3–4.0)

## 2010-05-25 LAB — BASIC METABOLIC PANEL
BUN: 8 mg/dL (ref 6–23)
CO2: 32 mEq/L (ref 19–32)
Chloride: 97 mEq/L (ref 96–112)
Creatinine, Ser: 0.49 mg/dL (ref 0.4–1.2)
Potassium: 3.1 mEq/L — ABNORMAL LOW (ref 3.5–5.1)

## 2010-05-25 LAB — CBC
Hemoglobin: 9.7 g/dL — ABNORMAL LOW (ref 12.0–15.0)
MCH: 32.6 pg (ref 26.0–34.0)
MCV: 95 fL (ref 78.0–100.0)
RBC: 2.98 MIL/uL — ABNORMAL LOW (ref 3.87–5.11)
WBC: 9.5 10*3/uL (ref 4.0–10.5)

## 2010-05-27 ENCOUNTER — Encounter: Payer: Self-pay | Admitting: Internal Medicine

## 2010-05-27 LAB — CBC
Hemoglobin: 9.7 g/dL — ABNORMAL LOW (ref 12.0–15.0)
Platelets: 188 10*3/uL (ref 150–400)
RBC: 3.11 MIL/uL — ABNORMAL LOW (ref 3.87–5.11)
WBC: 10.3 10*3/uL (ref 4.0–10.5)

## 2010-05-27 LAB — URINALYSIS, ROUTINE W REFLEX MICROSCOPIC
Bilirubin Urine: NEGATIVE
Glucose, UA: NEGATIVE mg/dL
Hgb urine dipstick: NEGATIVE
Protein, ur: NEGATIVE mg/dL

## 2010-05-27 LAB — COMPREHENSIVE METABOLIC PANEL
ALT: 31 U/L (ref 0–35)
AST: 35 U/L (ref 0–37)
Albumin: 2.3 g/dL — ABNORMAL LOW (ref 3.5–5.2)
Alkaline Phosphatase: 75 U/L (ref 39–117)
CO2: 30 mEq/L (ref 19–32)
Chloride: 97 mEq/L (ref 96–112)
Creatinine, Ser: 0.6 mg/dL (ref 0.4–1.2)
GFR calc Af Amer: >60 mL/min (ref >60)
Potassium: 3.7 mEq/L (ref 3.5–5.1)
Sodium: 133 mEq/L — ABNORMAL LOW (ref 135–145)
Total Bilirubin: 0.6 mg/dL (ref 0.3–1.2)

## 2010-05-27 LAB — URINE MICROSCOPIC-ADD ON

## 2010-05-29 LAB — URINE CULTURE
Colony Count: >=100000
Culture  Setup Time: 201205241102

## 2010-05-30 NOTE — H&P (Signed)
[name], [name]              ACCOUNT NO.:  [id number]  MEDICAL RECORD NO.:  [id number]           PATIENT TYPE:  E  LOCATION:  [facility]                         FACILITY:  [facility]  PHYSICIAN:  Isidor Holts, M.D.  DATE OF BIRTH:  [birth year]  DATE OF ADMISSION:  05/17/2010 DATE OF DISCHARGE:                             HISTORY & PHYSICAL   PRIMARY MD:  Eugenio Hoes. Tawanna Cooler, MD at [facility].  PRIMARY GASTROENTEROLOGIST:  Hedwig Morton. Juanda Chance, MD  CHIEF COMPLAINT:  Abdominal pain and vomiting since 3 to 4:00 a.m. May 17, 2010.  HISTORY OF PRESENT ILLNESS:  Essentially as above.  The patient is a good historian, although she is somewhat hard of hearing and wears a hearing aid.  According to her, she developed upper abdominal pain between 3 to 4 a.m. on May 17, 2010.  Since then has vomited about 3 to 4 times.  Has been unable to eat anything all day.  She denies any fever or chills.  Denies ingestion of any unusual foods.  Has no diarrhea. Denies constipation or weight loss, cough, chest pain, or shortness of breath.  PAST MEDICAL HISTORY: 1. Nonobstructive coronary artery disease. 2. Hypertension. 3. History of anemia. 4. History of breast cancer status post lumpectomy. 5. Status post hysterectomy. 6. Status post appendectomy. 7. Status post bilateral shoulder repair. 8. Anxiety. 9. Deafness.  Utilizes bilateral hearing aids.  ALLERGIES:  CODEINE, SULFA, PENICILLIN, and INDOMETHACIN.  MEDICATION HISTORY: 1. Lumigan ophthalmic solution 1 drop each eye at bedtime. 2. Klor-Con 20 mEq p.o. on Mondays, Wednesdays, and Fridays. 3. Lasix 20 mg p.o. daily. 4. Cartia XT 120 mg p.o. daily. 5. Iron OTC 1 tablet p.o. daily.  REVIEW OF SYSTEMS:  As per HPI and chief complaint otherwise negative.  SOCIAL HISTORY:  The patient is married, has 4 children, i.e. 3 sons and 1 daughter.  She is an ex-smoker, quit approximately 20 years ago. Drinks a highball about 3 times a week and occasional wine.   Has no history of drug abuse.  FAMILY HISTORY:  Noncontributory.  PHYSICAL EXAMINATION:  VITALS:  Temperature 98.2, pulse 94 per minute and regular, respiratory rate 19, BP 142/79 mmHg, pulse oximeter 94% on room air. GENERAL:  The patient did not appear to be in obvious acute distress at time of this evaluation, alert, communicative, not short of breath at rest. HEENT:  No clinical pallor or jaundice.  No conjunctival injection. Oral mucous membranes appear "dry." NECK:  Supple, JVP not seen.  No palpable lymphadenopathy.  No palpable goiter. CHEST:  Clinically clear to auscultation.  No wheezes or crackles. HEART:  Heart sounds 1 and 2 heard, normal and regular, no murmurs. ABDOMEN:  Moderately obese, soft, tender in the epigastric region. However, bowel sounds are heard.  There is no guarding or rebound.  No palpable organomegaly. EXTREMITIES:  Lower extremity, no pitting edema.  Palpable peripheral pulses. MUSCULOSKELETAL SYSTEM:  Generalized osteoarthritic changes. CENTRAL NERVOUS SYSTEM:  No focal neurologic deficit on grossexamination.  INVESTIGATIONS:  CBC:  WBC 11.3, hemoglobin 13.4, hematocrit 39.6, platelets 185,000.  Electrolytes:  Sodium 137, potassium 2.8, chloride 98, CO2 27, BUN 16,  creatinine 0.54, glucose 121.  LFTs are normal. Troponin I point-of-care is less than 0.05.  Chest x-ray May 17, 2010 shows low lung volumes, bibasilar atelectasis, cardiomegaly, no gross acute cardiopulmonary disease.  Abdominal/pelvic CT scan May 17, 2010 shows a 2 cm segment of circumferential colonic mural thickening in descending colon concerning for adenocarcinoma.  There was chronic partial obstruction and extensive diverticular disease.  There was also edema surrounding the gastroesophageal junction as well as the gastric cardia which could represent gastritis.  ASSESSMENT AND PLAN: 1. Acute gastritis.  This is the likely culprit for the patient's acute     presentation.  We  shall manage with bowel rest, intravenous fluid     hydration, proton pump inhibitor, antiemetics, and check lipase     levels for completeness, to rule out acute pancreatitis.  2. Focal circumferential thickening of descending colonic wall.  It is     important to rule out malignancy.  This represents an incidental     finding on abdominal/pelvic CT scan.  The patient denies antecedent     constipation and weight loss and last colonoscopy was approximately     6 years ago.  We shall check carcinoembryonic antigen levels.     Consult gastroenterologist, i.e. Dr. Juanda Chance in a.m. of May 18, 2010.  The patient will need a colonoscopy and biopsies to     determine definitive management.  3. Hypertension.  The patient's blood pressure is somewhat suboptimal     at the present time.  We shall reinstitute     preadmission antihypertensive medications and monitor.  4. Hypokalemia.  This is secondary to vomiting.  We shall replete     appropriately.    Further management will depend on clinical course.     Isidor Holts, M.D.     CO/MEDQ  D:  05/17/2010  T:  05/17/2010  Job:  [id number]  cc:   Tinnie Gens A. Tawanna Cooler, MD Hedwig Morton. Juanda Chance, MD  Electronically Signed by Isidor Holts M.D. on 05/30/2010 05:48:46 PM

## 2010-06-01 NOTE — Op Note (Signed)
NAMESHAYDEN, GINGRICH              ACCOUNT NO.:  [id number]  MEDICAL RECORD NO.:  [id number]           PATIENT TYPE:  LOCATION:                                 FACILITY:  PHYSICIAN:  Sharlet Salina T. Orlen Leedy, M.D.DATE OF BIRTH:  1920-02-24  DATE OF PROCEDURE:  05/20/2010 DATE OF DISCHARGE:                              OPERATIVE REPORT   PREOPERATIVE DIAGNOSIS:  Partially obstructing cancer of the left colon.  POSTOPERATIVE DIAGNOSIS:  Partially obstructing cancer of the left colon.  SURGICAL PROCEDURES:  Laparoscopic-assisted left hemicolectomy with takedown splenic flexure and primary anastomosis.  SURGEON:  Lorne Skeens. Rhen Kawecki, MD  ASSISTANT:  Troy Sine. Dwain Sarna, MD  ANESTHESIA:  General.  BRIEF HISTORY:  Somara Frymire is an 77 year old female who presented on May 17, 2010, with about a week of increasing abdominal pain and distention.  She has had nausea without vomiting.  Initial CT scan showed apparent obstructing mass at the proximal descending colon with dilated proximal colon.  This had an apple-core appearance consistent with cancer.  The patient was not completely obstructed and was able to be gently prepped and underwent colonoscopy yesterday showing a typical- appearing partially obstructing wall obstructing friable mass consistent with carcinoma.  Her abdomen has become much less distended.  We have recommended proceeding with laparoscopic-assisted possible open left colectomy for apparent cancer and obstruction.  The nature of the surgeries, indication, risks of bleeding, infection, anastomotic leak, possible need for open procedure, anesthetic complications were discussed and understood.  She is now brought to the operating room for this procedure.  DESCRIPTION OF OPERATION:  The patient was brought to the operating room, placed in supine position on the operating table, and general orotracheal anesthesia was induced.  She had been given broad-spectrum IV  antibiotics preoperatively.  PAS were in place.  She was carefully positioned in semi-lithotomy position.  Foley cath was placed.  The abdomen was widely sterilely prepped and draped.  Correct patient and procedure were verified.  Initially, a 1-cm incision was made in the midline just above the umbilicus.  Dissection was carried down to the midline fascia which incised 1 cm.  The peritoneum entered under direct vision.  Through mattress suture of 0 Vicryl, the Hasson trocar was placed and pneumoperitoneum established.  Laparoscopy was significant for a still quite dilated cecum which appeared mildly inflamed and there was one superficial serosal split from previous distention.  However, it was soft and clearly viable.  There was just mild distention of the transverse proximal left colon.  There was noted to be a markedly redundant sigmoid colon with extensive diverticulosis.  On tracing down the left colon, the mass was easily palpable with instruments, although it did not appear to be through the bowel wall.  It was located in the mid to proximal left colon.  Following this, 5-mm trocars were placed in the left upper quadrant, left lower quadrant, and right lower quadrant. The left and sigmoid colon were mobilized dividing the lateral peritoneal attachments and reflecting this over medially.  It was clear we need to take down the splenic flexure as the tumor was up probably 10- 15  cm from the splenic flexure.  The patient was placed in steep reverse Trendelenburg.  The lateral peritoneal attachments were taken up toward the splenic flexure.  She had a very high splenic flexure up underneath the spleen.  The omentum was elevated off the transverse colon with the Harmonic scalpel and the lesser sac was entered.  I then mobilized the transverse colon out distally toward the splenic flexure.  With the assistant holding the tip of the spleen up, we then came around the splenic colic ligament  with Harmonic scalpel.  Dissection was somewhat tedious.  We were then able to fully mobilize the splenic flexure down toward the mid abdomen.  This then allowed easier exposure of the lateral attachments of the left colon and sigmoid, and working inferiorly these were all taken down, bluntly mobilizing this area up toward the midline, working down toward the sigmoid colon.  As we got toward the sigmoid, we clearly identified the left ureter.  Posterior to the dissection, it was protected.  The sigmoid was markedly redundant with some probably old inflammatory attachment over to the left pelvic sidewall from diverticulosis or previous hysterectomy.  As we came down distally, these lateral attachments were divided and the redundant area of sigmoid colon was completely released and mobilized up into the upper abdomen.  At this point, we felt we had very good mobility.  I then made an approximately 5-cm incision just beneath the umbilicus in the midline and the Alexis wound protector was placed.  The colon was brought out through this incision.  Initially, there was felt to be still some tethering right at the tumor laterally.  These attachments were easily exposed, however, and using the Harmonic scalpel they were devised further mobilizing the mesentery up toward the wound and then the colon including the mass could be brought easily out through the wound protector.  We had a long length of redundant sigmoid colon that came up easily through the wound protector as well and then we worked proximally, and all of the more proximal colon, the splenic flexure, and the distal transverse colon was easily mobilized up through the wound protector.  We worked proximally to the area where the omentum was still attached to the mid to distal transverse colon just distal to the middle colic vessels.  We elected to use this for our proximal resection point and then we chose an area distal to most of the  redundant sigmoid colon and diverticular disease at the distal sigmoid colon for our distal resection point.  The bowel in both these areas was cleared of mesenteric pericolic fat, divided with single firing of the GIA-75 mm stapler.  The mesentery of the involved segment was then sequentially taken with the large EnSeal device.  The left colic vessels were also clamped proximally and tied with 2-0 silk ties and then the specimen removed.  The bowel ends were healthy, not distended with very good blood supply.  They came together easily without any tension.  We then performed a functional end-to-end anastomosis with a single firing of the GIA-75 mm stapler.  The staple line was intact and without bleeding. The common enterotomy was then closed with inner running full-thickness 2-0 chromic begun either in the enterotomy and tied centrally and then an outer row of seromuscular 2-0 silk.  The anastomosis was widely patent, under no tension appeared to have very good blood supply and was airtight to compression.  Following this, the bowel was returned to the abdominal cavity.  The wound protector was removed and gloves and instruments changed.  The midline fascia was closed with running #1 PDS begun either end and tied centrally.  The abdomen was then reinsufflated and then carefully inspected.  The bowel was lying nicely along the left abdomen with anastomosis visible under no tension.  There was no evidence of any bleeding from the left upper quadrant or mesentery in the left abdomen.  Of note, we had extracted the cecum through the wound protector prior to removing and carefully inspecting it.  All appeared quite viable, and although still somewhat distended it was very soft, not under any tension, and we did not feel in any way that it would require resection.  Cecum was again inspected and looked fine.  There was no evidence of bleeding or other problems and the abdomen was irrigated and  suctioned and then all trocars removed.  Skin incisions were closed with staples.  Sponge, needle, instrument counts correct. The patient taken to recovery in good condition.     Lorne Skeens. Marylynn Rigdon, M.D.    Tory Emerald  D:  05/20/2010  T:  05/21/2010  Job:  [id number]  Electronically Signed by Glenna Fellows M.D. on 06/01/2010 10:10:22 AM

## 2010-06-02 ENCOUNTER — Telehealth: Payer: Self-pay | Admitting: Internal Medicine

## 2010-06-02 ENCOUNTER — Ambulatory Visit (INDEPENDENT_AMBULATORY_CARE_PROVIDER_SITE_OTHER): Payer: Medicare Other | Admitting: Family Medicine

## 2010-06-02 ENCOUNTER — Encounter: Payer: Self-pay | Admitting: Family Medicine

## 2010-06-02 DIAGNOSIS — L259 Unspecified contact dermatitis, unspecified cause: Secondary | ICD-10-CM

## 2010-06-02 DIAGNOSIS — L239 Allergic contact dermatitis, unspecified cause: Secondary | ICD-10-CM

## 2010-06-02 DIAGNOSIS — C189 Malignant neoplasm of colon, unspecified: Secondary | ICD-10-CM | POA: Insufficient documentation

## 2010-06-02 MED ORDER — FLUOCINONIDE 0.05 % EX CREA: 1.0000 "application " | TOPICAL_CREAM | Freq: Two times a day (BID) | CUTANEOUS | Status: DC

## 2010-06-02 NOTE — Patient Instructions (Signed)
Take the Cardizem, one tablet Monday, Wednesday, Friday.  Check your blood pressure daily in the morning.  Small amounts of Lidex twice daily for the rash on her lower extremities.  Follow-up in one month, sooner if any problems.  Call [phone],,,,,,,,,,,,,, Dr. Marian Sorrow nurse about being seen ASAP for the symptoms of esophageal stricture.  In the meantime, a soft diet drink lots of water

## 2010-06-02 NOTE — Telephone Encounter (Signed)
Spoke with patient and she saw Dr. Tawanna Cooler today and he wants her to see Dr. Juanda Chance ASAP due to feeling like things getting caught in her throat. Scheduled patient to see Dr. Juanda Chance on 06/07/10 at 2:00 PM.

## 2010-06-02 NOTE — Consult Note (Signed)
[name], [name]              ACCOUNT NO.:  [id number]  MEDICAL RECORD NO.:  [id number]           PATIENT TYPE:  I  LOCATION:  5522                         FACILITY:  [facility]  PHYSICIAN:  Mary Sella. Andrey Campanile, MD     DATE OF BIRTH:  1920/04/19  DATE OF CONSULTATION:  05/17/2010 DATE OF DISCHARGE:                                CONSULTATION   PHYSICIAN PERFORMING CONSULTATION:  Mary Sella. Andrey Campanile, MD  REASON FOR CONSULTATION:  Descending colon narrowing and abdominal pain.  CHIEF COMPLAINT:  Abdominal pain.  PRIMARY CARE PHYSICIAN:  Tinnie Gens A. Tawanna Cooler, MD  HISTORY OF PRESENT ILLNESS:  [name] is a very pleasant high functional 77 year old Caucasian female who has had nonspecific abdominal pain for several weeks now.  She said occasionally she will have pain for about a day or so and then resolve, and she will feel better.  She says this has been actually probably going on for over a month.  She reports daily bowel movements.  However, the pain got worse last week.  She said she had a flare on Tuesday, slowly got better on Wednesday and Thursday.  However, she had another flare on Mother's Day as well as some emesis.  Because of the worsening pain and the emesis, she came to the emergency room earlier today for evaluation.  She describes that occasionally her belly will get bloated and distended and abdominal pain is mainly in the upper abdomen above the level of the umbilicus.  She denies any melena, hematochezia, or weight loss.  She does endorse some nocturia.  Her last bowel movement was yesterday.  She does have a history of anemia.  She says she has had a colonoscopy a few years, ago but cannot recall the results.  She does have a personal history of breast cancer, and her brother did have a history of colon cancer.  PAST MEDICAL HISTORY: 1. Hypertension. 2. History of anemia. 3. Nonobstructive coronary artery disease. 4. History of right breast invasive lobular carcinoma in  the early     2000s.  PAST SURGICAL HISTORY: 1. Right breast lumpectomy. 2. Hysterectomy. 3. Appendectomy.  MEDICATIONS:  Lasix, Cardizem, and iron.  ALLERGIES:  CODEINE, SULFA, and PENICILLIN.  REVIEW OF SYSTEMS:  A comprehensive 12-point review of systems was performed.  All systems are negative except what is mentioned in the HPI.  She is specifically denies chest pain, shortness of breath, dyspnea on exertion.  She still drives and does her grocery shopping.  SOCIAL HISTORY:  She denies drugs and tobacco.  She lives alone in a high-rise apartment.  She drinks alcohol on occasional basis.  PHYSICAL EXAMINATION:  VITAL SIGNS:  Temperature 98, heart rate 100, blood pressure 130/82, respirations 23, sating 96% on room air. GENERAL:  A well-developed, well-nourished, healthy-appearing Caucasian female, appearing younger than stated age. HEENT:  Atraumatic, normocephalic.  Pupils are equal.  No scleral icterus.  Positive glasses.  Positive hearing aids. NECK:  No lymphadenopathy.  Trachea is midline. PULMONARY:  Lungs are clear.  Symmetric chest rise.  No accessory use of muscles. CARDIOVASCULAR:  Regular rate and rhythm, 2+ radial pulse.  ABDOMEN:  Soft.  Some mild tenderness in the left lower quadrant.  She has a protuberant abdomen, it is little bit tympanic, but there is no rebound.  There is no guarding.  There is a well-healed lower midline scar.  No signs of incisional hernia. MUSCULOSKELETAL:  She moves all extremities.  No obvious joint deformity.  Strength is symmetric. SKIN:  No jaundice or rash.  No edema.  She does have some dry skin on her lower legs. NEURO:  Nonfocal.  Sensation grossly intact. PSYCHIATRIC:  Alert and oriented x3.  Judgment and insight appears appropriate.  LABORATORY DATA:  Sodium 137, potassium 2.8, chloride 98, bicarb 27, BUN 16, creatinine 0.54, blood sugar 121, calcium 9.4, lipase 37.  The LFTs, total bilirubin 0.4, AST 17, ALT 8, alk phos  65.  Cardiac enzymes negative.  White count 11.3, hemoglobin 13.4, hematocrit 39.6, platelet count 185.  RADIOGRAPHY: 1. Chest x-ray showed cardiomegaly and low lung volumes. 2. CT of abdomen and pelvis showed a large hiatal hernia, some edema     at the gastroesophageal junction, dilated cecum as well as dilation     of the ascending and transverse colons.  The mid descending colon     showed a caliber change with circumferential mural thickening over     2 cm as well as diverticular disease.  There is no lymphadenopathy.     The bones look okay.  There is no small bowel dilatation.  IMPRESSION:  An 77 year old Caucasian female with hypokalemia, hypertension, anemia, nonobstructive coronary artery disease, history of breast cancer as well as partial colonic obstruction secondary to mid descending colon lesion, probable adenocarcinoma.  PLAN:  Differential includes focal ischemia, intra-abdominal adhesions versus mass.  Given her history, I am most concerned for tumor causing the wall thickening and this is specifically adenocarcinoma.  I think this is unlikely to be focal ischemia or intra-abdominal adhesions causing this.  I think the patient needs to be admitted.  She needs potassium replaced.  She needs some IV fluids.  We will consult Dr. Regino Schultze office tomorrow to schedule her for an inpatient colonoscopy. I am hopeful that the colonoscope will be able to transverse the area of narrowing.  If not, at least need to give tissue diagnosis to confirm our suspicion.  We will also check a CEA level in the morning.  There are no signs of masses in the liver or any signs of metastatic disease; but if it is a cancer, she will need to have a chest CT of the chest. We will have to await the results from the colonoscopy to determine the final plan.  If possible, two option come to mind.  If she is found to have a colonic cancer and if she is able to tolerate clears and keep liquids down,  we will get her colon decompressed, and if possible she can go onto chemotherapy to try to shrink the lesion.  However, she may need surgery at front, but we will have to await the colonoscopy report and findings and pathology.     Mary Sella. Andrey Campanile, MD     EMW/MEDQ  D:  05/17/2010  T:  05/18/2010  Job:  [id number]  cc:   Tinnie Gens A. Tawanna Cooler, MD Everardo Beals Juanda Chance, MD, [facility]  Electronically Signed by Gaynelle Adu M.D. on 06/02/2010 08:58:23 AM

## 2010-06-02 NOTE — Progress Notes (Signed)
  Subjective:    Patient ID: [name], female    DOB: 29-Dec-1920, 77 y.o.   MRN: [id number]  HPI  F. Is a delightful, 77 year old, divorced female, who comes in today accompanied by her daughter for follow-up having surgery for colon cancer.  We saw here in the office on May 14 with acute abdominal pain, nausea, and vomiting.  We sent her to the emergency room for evaluation.  Subsequently, was found to have a colon cancer in the transverse colon.  She had a partial colectomy with re-anastomosis by Dr. Andrey Campanile.  She did well and was discharged with no complications.  She comes back today for follow-up on the recommendation of the hospital, [place].  Medications his stay, the same.  The only problem she is having now is reflux esophagitis.  She was told on discharge that the upper endoscopy showed some stenosis and stricture.  When she eats large quantities of, food she'll vomit.  No chest pain.  She is off all the pain medications.  She does have a 4-point walker.  She stated that back to see the general surgeon soon    Review of Systems    General and GI review of systems otherwise negative specifically bowel movements normal Objective:   Physical Exam     Well-developed well-nourished, female, in no acute distress.  Cardiovascular and pulmonary exam normal except for blood pressure low 108/78   Assessment & Plan:  Status post surgery for colon cancer, recovering well.  Hypertension with low blood pressure change Cardizem to Monday, Wednesday, Friday.  Follow up with me in one month.

## 2010-06-04 ENCOUNTER — Encounter: Payer: Self-pay | Admitting: Internal Medicine

## 2010-06-07 ENCOUNTER — Encounter: Payer: Self-pay | Admitting: Internal Medicine

## 2010-06-07 ENCOUNTER — Ambulatory Visit (INDEPENDENT_AMBULATORY_CARE_PROVIDER_SITE_OTHER): Payer: Medicare Other | Admitting: Internal Medicine

## 2010-06-07 DIAGNOSIS — K222 Esophageal obstruction: Secondary | ICD-10-CM

## 2010-06-07 DIAGNOSIS — Z85048 Personal history of other malignant neoplasm of rectum, rectosigmoid junction, and anus: Secondary | ICD-10-CM

## 2010-06-07 NOTE — Patient Instructions (Addendum)
You have been scheduled for an endoscopy. Please follow written instructions given to you at your visit today. We have given you a limited supply of Nexium samples. Please take 1 tablet by mouth once daily until your endoscopy. We will decide definitively on what medication to give you after the endoscopy has been comp      Dr Shela Commons.Tawanna Cooler

## 2010-06-07 NOTE — Progress Notes (Signed)
[name] 19-Oct-1920 MRN [id number]      History of Present Illness:  This is a 77 year old white female with a recent diagnosis of adenocarcinoma of the transverse colon found on a colonoscopy by Dr Russella Dar when she presented to the emergency room with an intestinal obstruction. She had a colonoscopy by me in 1998 and again in 2004 which were both normal. She was supposed to have a recall colonoscopy in 2009 because of her family history of colon cancer. She underwent a laparoscopically assisted left hemicolectomy with 1/20 nodes positive. She comes today with progressive onset of solid food dysphagia which started since the surgery. She denies a history of gastroesophageal reflux. An upper endoscopy on 05/19/2010 showed a distal esophageal stricture and 5 cm hiatal hernia. The stricture was not dilated , and she has had several episodes of choking and regurgitation of the food. So far, it has always occurred in the presence of her daughter but the family is concerned that this could happen when she is alone.   Past Medical History  Diagnosis Date  . Breast cancer     lumpectomy followed by RT and Tamoxifen x 5 years  . Hypertension   . CAD (coronary artery disease)   . Dermatitis   . Female stress incontinence   . Seborrheic keratosis, inflamed   . Pulmonary fibrosis   . Colon adenocarcinoma 05/19/10  . Hearing loss   . Erosive esophagitis 05/19/10  . Hiatal hernia 05/19/10    5 cm  . Anemia   . Diverticulosis 05/19/10    moderate  . Anxiety   . Family history of malignant neoplasm of gastrointestinal tract     brother   Past Surgical History  Procedure Date  . Abdominal hysterectomy   . Breast lumpectomy   . Shoulder arthroscopy w/ acromial repair     bilateral  . Appendectomy   . Colon resection 05/20/2010    [facility]    reports that she quit smoking about 27 years ago. Her smoking use included Cigarettes. She does not have any smokeless tobacco history on file. She  reports that she drinks about 3.5 ounces of alcohol per week. She reports that she does not use illicit drugs. family history includes Breast cancer in her daughter; Colon cancer in her brother; Colon polyps in her daughter; Heart disease in her mother; and Kidney failure in her father. Allergies  Allergen Reactions  . Codeine     REACTION: Stomach cramps  . Indomethacin     REACTION: TIA type episodes  . Penicillins     REACTION: vomit  . Sulfonamide Derivatives     REACTION: Nausea Vomiting        Review of Systems: Denies odynophagia. Denies abdominal pain rectal bleeding or diarrhea  The remainder of the 10  point ROS is negative except as outlined in H&P    Assessment and Plan:  Problem # 1 Solid food dysphagia suggestive of an esophageal stricture. This was documented on a recent upper endoscopy. She will be scheduled for an esophageal dilatation tomorrow. I assume this is a benign disease. At her age of 89, she would not be a candidate for repair of the hiatal hernia but we would likely keep her on antireflux measures and acid reducing agents.  Problem #2 Status post colon resection for transverse colon carcinoma on 05/19/2010. She is currently doing well. A recall colonoscopy will possibly need to be completed in one year if her general health allows.  06/07/2010 Verlee Monte  Juanda Chance

## 2010-06-08 ENCOUNTER — Encounter: Payer: Self-pay | Admitting: Internal Medicine

## 2010-06-08 ENCOUNTER — Ambulatory Visit (AMBULATORY_SURGERY_CENTER): Payer: Medicare Other | Admitting: Internal Medicine

## 2010-06-08 DIAGNOSIS — K2960 Other gastritis without bleeding: Secondary | ICD-10-CM

## 2010-06-08 DIAGNOSIS — K222 Esophageal obstruction: Secondary | ICD-10-CM

## 2010-06-08 DIAGNOSIS — K2940 Chronic atrophic gastritis without bleeding: Secondary | ICD-10-CM

## 2010-06-08 DIAGNOSIS — K2970 Gastritis, unspecified, without bleeding: Secondary | ICD-10-CM

## 2010-06-08 DIAGNOSIS — R1310 Dysphagia, unspecified: Secondary | ICD-10-CM

## 2010-06-08 HISTORY — DX: Esophageal obstruction: K22.2

## 2010-06-08 MED ORDER — SODIUM CHLORIDE 0.9 % IV SOLN: 500.0000 mL | INTRAVENOUS | Status: DC

## 2010-06-08 MED ORDER — RANITIDINE HCL 150 MG PO TABS: 150.0000 mg | ORAL_TABLET | Freq: Every day | ORAL | Status: DC

## 2010-06-08 NOTE — Patient Instructions (Signed)
Discharge instructions given with verbal understanding. Handouts on gastritis, hiatal hernia and a stricture given. Dilatation diet for remaining of today. Resume previous medications.

## 2010-06-09 ENCOUNTER — Telehealth: Payer: Self-pay | Admitting: *Deleted

## 2010-06-09 NOTE — Telephone Encounter (Signed)

## 2010-06-14 NOTE — Discharge Summary (Signed)
[name], [name]              ACCOUNT NO.:  [id number]  MEDICAL RECORD NO.:  [id number]  LOCATION:  5522                         FACILITY:  [facility]  PHYSICIAN:  Lorne Skeens. Roanne Haye, M.D.DATE OF BIRTH:  1920/05/30  DATE OF ADMISSION:  05/17/2010 DATE OF DISCHARGE:                              DISCHARGE SUMMARY   DATE OF DISCHARGE:  Expected discharge May 27, 2010.  CHIEF COMPLAINT: 1. Abdominal pain and vomiting, possible gastritis. 2. Focal thickening in descending colon on CT. 3. Hypertension. 4. Obstructive coronary artery disease. 5. Hypertension. 6. Bilateral hearing deficits with hearing aids in both ears.  DISCHARGE DIAGNOSES: 1. 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  No evidence of extension. 2. Stricture of the gastroesophageal junction, erosive esophagitis,     and hiatal hernia by esophagogastroduodenoscopy May 19, 2010. 3. Postoperative hypokalemia. 4. Postoperative fluid overload - mild. 5. History of nonobstructive coronary artery disease. 6. Hypertension. 7. History of anemia. 8. Status post lumpectomy for breast cancer.  PROCEDURES: 1. Colonoscopy May 19, 2010, showing obstructing malignant appearing     mass in the transverse colon with moderate diverticulosis in the     sigmoid to descending colon. 2. Stricture at the gastroesophageal junction, erosive esophagitis,     and hiatal hernia. 3. Laparoscopic assisted left hemicolectomy with takedown of splenic     flexure and primary anastomosis May 20, 2010, Dr. Jaclynn Guarneri.  BRIEF HISTORY:  The patient is an 77 year old lady who presented to the ER at [facility] with nausea and vomiting.  According to her, the patient developed problems between 3 and 4:00 a.m. on May 17, 2010. She has vomited multiple times, unable to eat all day, eventually presented to the ER.  Workup in the ER included a CT scan which showed a 2 cm  circumferential colonic mural thickening on the descending colon concerning for an adenocarcinoma.  There was also distention of the colon proximal to the lesion and stasis of the colonic content suggesting a chronic partial obstruction.  There is edema surrounding the gastroesophageal junction to the cardia which could also represent gastritis.  The patient was seen at evening consultation by Dr. Gaynelle Adu at the Surgery Service.  It was his impression that she had what would most likely be a partial colon obstruction secondary to probable adenocarcinoma.  The patient was admitted by the medical service and we saw the patient in consult.  For further history and physical, please see dictated history by Dr. Brien Few.  HOSPITAL COURSE:  The patient was admitted.  She was made n.p.o., placed on IV fluids, and rehydrated.  She was seen by Dr. Regino Schultze service the following a.m. and subsequently scheduled for EGD and upper GI.  This was performed on May 19, 2010.  Again, the colonoscopy showed a obstructing malignant appearing mass in distal transverse colon, also biopsies obtained from this did show adenocarcinoma.  EGD also showed stricture at the GE junction and erosive esophagitis and a 5-cm hiatal hernia.  After reviewing this, it was Dr. Jamse Mead opinion that we should proceed with a laparoscopic anterior partial colectomy the next day.  He discussed  this with the patient's family in detail, informed operative consent was obtained.  On May 20, 2010, the patient underwent procedures described above.  She tolerated the procedure well and returned to the floor in satisfactory condition.  Postoperatively, she has done really well.  She opened up fairly quickly and has had some problems with diarrhea.  She also had some fluid overload, got some Lasix with some subsequent hypokalemia which has been slowly repleted. She was started on clear liquids on May 24, 2010, her fourth postoperative day  and by May 23, she has been advanced to a soft diet. She is still having some problems with diarrhea.  PT and OT have been asked to work with her and she is making good progress.  She wants to go home but we were still somewhat concerned about her diarrhea and her hypokalemia.  We planned to get her off her IV today.  We will continue her soft diet, which was started this morning and if she is doing well aim for discharge in the a.m.  Labs are pending in the a.m.  Family has been very supportive and they will be available to be with her when she goes home.  Followup will be with Dr. Jaclynn Guarneri on Thursday, Jun 03, 2010.  We will have her contact Dr. Juanda Chance for followup at their office. We will also have her follow up with Dr. Kelle Darting, her primary care, in approximately 2 weeks.  DISCHARGE ACTIVITY:  Light to moderate.  She is ambulating, family is helping her.  She will have a 3-in-1 to be handy at home should she need it.  We will discharge her home on her preadmission meds which include Cartia XT 120 mg daily, iron OTC one daily, potassium we are going to continue currently at 20 mEq b.i.d. for a week and then switch her back to her KCl 3 times a week as before.  She could continue Lasix 20 mg daily and Lumigan ophthalmic drops 1 drop in each eye daily.  New will be plain Tylenol for pain, multivitamin with iron one daily, and Percocet one to two p.o. q.4 h. p.r.n.  She is to clean her wounds with plain soap and water.  We will have her staples removed when she comes back.  CONDITION ON DISCHARGE:  Improved.     Eber Hong, P.A.   ______________________________ Lorne Skeens. Myrian Botello, M.D.    WDJ/MEDQ  D:  05/26/2010  T:  05/27/2010  Job:  [id number]  cc:   Hedwig Morton. Juanda Chance, MD Eugenio Hoes Tawanna Cooler, MD  Electronically Signed by Sherrie George P.A. on 06/09/2010 03:20:20 PM Electronically Signed by Glenna Fellows M.D. on 06/14/2010 01:37:45 PM

## 2010-06-15 ENCOUNTER — Other Ambulatory Visit: Payer: Self-pay | Admitting: Family Medicine

## 2010-06-16 ENCOUNTER — Telehealth: Payer: Self-pay | Admitting: Family Medicine

## 2010-06-16 ENCOUNTER — Encounter: Payer: Self-pay | Admitting: Internal Medicine

## 2010-06-16 NOTE — Telephone Encounter (Signed)
Pt bought walker as previously discussed with Dr Tawanna Cooler during last ov. Pt has been informed, that medicare will pay for walker, if pcp writes a letter stating that walk is medically necessary. Pt req that this letter be mailed to pt at home address asap.

## 2010-06-18 ENCOUNTER — Encounter: Payer: Medicare Other | Admitting: Oncology

## 2010-06-21 ENCOUNTER — Encounter (HOSPITAL_BASED_OUTPATIENT_CLINIC_OR_DEPARTMENT_OTHER): Payer: Medicare Other | Admitting: Oncology

## 2010-06-21 DIAGNOSIS — Z853 Personal history of malignant neoplasm of breast: Secondary | ICD-10-CM

## 2010-06-21 DIAGNOSIS — C184 Malignant neoplasm of transverse colon: Secondary | ICD-10-CM

## 2010-06-21 NOTE — Telephone Encounter (Signed)
Fleet Contras please send a note that the walker is medically necessary

## 2010-06-22 NOTE — Telephone Encounter (Signed)
Letter mailed to home address

## 2010-06-30 ENCOUNTER — Encounter: Payer: Self-pay | Admitting: Family Medicine

## 2010-06-30 ENCOUNTER — Ambulatory Visit (INDEPENDENT_AMBULATORY_CARE_PROVIDER_SITE_OTHER): Payer: Medicare Other | Admitting: Family Medicine

## 2010-06-30 VITALS — BP 116/80 | Temp 98.6°F | Wt 159.0 lb

## 2010-06-30 DIAGNOSIS — I10 Essential (primary) hypertension: Secondary | ICD-10-CM

## 2010-06-30 MED ORDER — DILTIAZEM HCL 60 MG PO TABS: ORAL_TABLET | ORAL | Status: DC

## 2010-06-30 NOTE — Progress Notes (Signed)
  Subjective:    Patient ID: [name], female    DOB: 20-Jul-1920, 77 y.o.   MRN: [id number]  HPIFrancis is a 77 year old, divorced female, nonsmoker, who comes in today accompanied by her daughter for follow-up of hypertension.  She is currently on Cardizem 120 mg daily.  BP 116/80.  She is somewhat lightheaded when she stands up suddenly.  Will cut BP medication down.  She is currently under the care of Dr. Inetta Fermo. At the oncology center.  Follow-up chemotherapy from her colon surgery.  Tolerating the medication well.    Review of Systems    General review of systems negative Objective:   Physical Exam    Well-developed well-nourished, female, in no acute distress.  BP 116/80    Assessment & Plan:  Hypertension however, with BP low cut back on the Cardizem to 90 mg daily

## 2010-06-30 NOTE — Patient Instructions (Signed)
Decrease the Cardizem to 60 mg daily.  Check a morning blood pressure daily for 3 weeks.  Goal is 135/85, if any questions, call leave a voicemail with my nurse, Fleet Contras, and I will call you back to make any medication adjustments

## 2010-07-09 ENCOUNTER — Ambulatory Visit (INDEPENDENT_AMBULATORY_CARE_PROVIDER_SITE_OTHER): Payer: Medicare Other | Admitting: General Surgery

## 2010-07-09 DIAGNOSIS — Z9889 Other specified postprocedural states: Secondary | ICD-10-CM

## 2010-07-09 DIAGNOSIS — C186 Malignant neoplasm of descending colon: Secondary | ICD-10-CM | POA: Insufficient documentation

## 2010-07-09 NOTE — Patient Instructions (Signed)
All as needed for questions or problems

## 2010-07-09 NOTE — Progress Notes (Signed)
Patient returns for long-term followup status post laparoscopic-assisted left hemicolectomy for T4 A. and one a 10 zero cancer of the descending colon. She is now on adjuvant capacitabine. She is tolerating this with minimal side effects. She denies any abdominal pain or difficulty with her bowel movements.  On examination she appears well. Abdomen is soft and nontender. Wound is well healed without complications.  She is doing very well postoperatively. I'll plan to see her back for long-term followup in 6 months.

## 2010-07-13 ENCOUNTER — Emergency Department (HOSPITAL_COMMUNITY)
Admission: EM | Admit: 2010-07-13 | Discharge: 2010-07-14 | Disposition: A | Discharge status: Home / Self Care | Payer: Medicare Other | Attending: Emergency Medicine | Admitting: Emergency Medicine

## 2010-07-13 ENCOUNTER — Ambulatory Visit: Payer: Medicare Other | Admitting: Internal Medicine

## 2010-07-13 DIAGNOSIS — Z79899 Other long term (current) drug therapy: Secondary | ICD-10-CM | POA: Insufficient documentation

## 2010-07-13 DIAGNOSIS — R197 Diarrhea, unspecified: Secondary | ICD-10-CM | POA: Insufficient documentation

## 2010-07-13 DIAGNOSIS — R112 Nausea with vomiting, unspecified: Secondary | ICD-10-CM | POA: Insufficient documentation

## 2010-07-13 DIAGNOSIS — C189 Malignant neoplasm of colon, unspecified: Secondary | ICD-10-CM | POA: Insufficient documentation

## 2010-07-13 DIAGNOSIS — N390 Urinary tract infection, site not specified: Secondary | ICD-10-CM | POA: Insufficient documentation

## 2010-07-13 DIAGNOSIS — R109 Unspecified abdominal pain: Secondary | ICD-10-CM | POA: Insufficient documentation

## 2010-07-13 DIAGNOSIS — I10 Essential (primary) hypertension: Secondary | ICD-10-CM | POA: Insufficient documentation

## 2010-07-13 LAB — URINALYSIS, ROUTINE W REFLEX MICROSCOPIC
Bilirubin Urine: NEGATIVE
Hgb urine dipstick: NEGATIVE
Specific Gravity, Urine: 1.006 (ref 1.005–1.030)
Urobilinogen, UA: 0.2 mg/dL (ref 0.0–1.0)
pH: 6.5 (ref 5.0–8.0)

## 2010-07-13 LAB — URINE MICROSCOPIC-ADD ON

## 2010-07-14 ENCOUNTER — Emergency Department (HOSPITAL_COMMUNITY): Payer: Medicare Other

## 2010-07-14 ENCOUNTER — Encounter (HOSPITAL_COMMUNITY): Payer: Self-pay

## 2010-07-14 LAB — COMPREHENSIVE METABOLIC PANEL
Alkaline Phosphatase: 43 U/L (ref 39–117)
BUN: 16 mg/dL (ref 6–23)
CO2: 30 mEq/L (ref 19–32)
Chloride: 94 mEq/L — ABNORMAL LOW (ref 96–112)
GFR calc Af Amer: >60 mL/min (ref >60)
Glucose, Bld: 122 mg/dL — ABNORMAL HIGH (ref 70–99)
Potassium: 3.4 mEq/L — ABNORMAL LOW (ref 3.5–5.1)
Total Bilirubin: 0.5 mg/dL (ref 0.3–1.2)

## 2010-07-14 LAB — CBC
HCT: 32.6 % — ABNORMAL LOW (ref 36.0–46.0)
MCV: 94.8 fL (ref 78.0–100.0)
RBC: 3.44 MIL/uL — ABNORMAL LOW (ref 3.87–5.11)
WBC: 7.9 10*3/uL (ref 4.0–10.5)

## 2010-07-14 LAB — DIFFERENTIAL
Lymphocytes Relative: 23 % (ref 12–46)
Lymphs Abs: 1.8 10*3/uL (ref 0.7–4.0)
Neutrophils Relative %: 62 % (ref 43–77)

## 2010-07-14 MED ORDER — IOHEXOL 300 MG/ML  SOLN
100.0000 mL | Freq: Once | INTRAMUSCULAR | Status: AC | PRN
  Administered 2010-07-14: 100 mL via INTRAVENOUS

## 2010-07-15 LAB — URINE CULTURE

## 2010-07-19 ENCOUNTER — Other Ambulatory Visit: Payer: Self-pay | Admitting: Oncology

## 2010-07-19 ENCOUNTER — Encounter (HOSPITAL_BASED_OUTPATIENT_CLINIC_OR_DEPARTMENT_OTHER): Payer: Medicare Other | Admitting: Oncology

## 2010-07-19 DIAGNOSIS — Z170 Estrogen receptor positive status [ER+]: Secondary | ICD-10-CM

## 2010-07-19 DIAGNOSIS — Z853 Personal history of malignant neoplasm of breast: Secondary | ICD-10-CM

## 2010-07-19 DIAGNOSIS — C184 Malignant neoplasm of transverse colon: Secondary | ICD-10-CM

## 2010-07-19 DIAGNOSIS — R110 Nausea: Secondary | ICD-10-CM

## 2010-07-19 LAB — CBC WITH DIFFERENTIAL/PLATELET
Eosinophils Absolute: 0.5 10*3/uL (ref 0.0–0.5)
HCT: 31.8 % — ABNORMAL LOW (ref 34.8–46.6)
LYMPH%: 21.3 % (ref 14.0–49.7)
MONO#: 1.2 10*3/uL — ABNORMAL HIGH (ref 0.1–0.9)
NEUT#: 3.6 10*3/uL (ref 1.5–6.5)
Platelets: 165 10*3/uL (ref 145–400)
RBC: 3.26 10*6/uL — ABNORMAL LOW (ref 3.70–5.45)
WBC: 6.8 10*3/uL (ref 3.9–10.3)

## 2010-07-19 LAB — COMPREHENSIVE METABOLIC PANEL
Albumin: 3.6 g/dL (ref 3.5–5.2)
CO2: 26 mEq/L (ref 19–32)
Calcium: 8.1 mg/dL — ABNORMAL LOW (ref 8.4–10.5)
Chloride: 99 mEq/L (ref 96–112)
Glucose, Bld: 97 mg/dL (ref 70–99)
Potassium: 4 mEq/L (ref 3.5–5.3)
Sodium: 133 mEq/L — ABNORMAL LOW (ref 135–145)
Total Bilirubin: 0.4 mg/dL (ref 0.3–1.2)
Total Protein: 5.8 g/dL — ABNORMAL LOW (ref 6.0–8.3)

## 2010-07-26 ENCOUNTER — Encounter (HOSPITAL_BASED_OUTPATIENT_CLINIC_OR_DEPARTMENT_OTHER): Payer: Medicare Other | Admitting: Oncology

## 2010-07-26 ENCOUNTER — Other Ambulatory Visit: Payer: Self-pay | Admitting: Oncology

## 2010-07-26 DIAGNOSIS — R197 Diarrhea, unspecified: Secondary | ICD-10-CM

## 2010-07-26 DIAGNOSIS — C184 Malignant neoplasm of transverse colon: Secondary | ICD-10-CM

## 2010-07-26 DIAGNOSIS — Z170 Estrogen receptor positive status [ER+]: Secondary | ICD-10-CM

## 2010-07-26 DIAGNOSIS — Z853 Personal history of malignant neoplasm of breast: Secondary | ICD-10-CM

## 2010-07-26 LAB — CBC WITH DIFFERENTIAL/PLATELET
Basophils Absolute: 0.1 10*3/uL (ref 0.0–0.1)
EOS%: 5.1 % (ref 0.0–7.0)
HCT: 32.6 % — ABNORMAL LOW (ref 34.8–46.6)
HGB: 11.4 g/dL — ABNORMAL LOW (ref 11.6–15.9)
LYMPH%: 18.7 % (ref 14.0–49.7)
MCH: 33.9 pg (ref 25.1–34.0)
MCHC: 34.8 g/dL (ref 31.5–36.0)
MCV: 97.5 fL (ref 79.5–101.0)
MONO%: 13.4 % (ref 0.0–14.0)
NEUT%: 61 % (ref 38.4–76.8)
Platelets: 164 10*3/uL (ref 145–400)

## 2010-07-26 LAB — BASIC METABOLIC PANEL
BUN: 15 mg/dL (ref 6–23)
Calcium: 9.2 mg/dL (ref 8.4–10.5)
Chloride: 95 mEq/L — ABNORMAL LOW (ref 96–112)
Creatinine, Ser: 0.76 mg/dL (ref 0.50–1.10)

## 2010-08-03 ENCOUNTER — Other Ambulatory Visit: Payer: Self-pay | Admitting: Oncology

## 2010-08-03 ENCOUNTER — Encounter (HOSPITAL_BASED_OUTPATIENT_CLINIC_OR_DEPARTMENT_OTHER): Payer: Medicare Other | Admitting: Oncology

## 2010-08-03 DIAGNOSIS — C184 Malignant neoplasm of transverse colon: Secondary | ICD-10-CM

## 2010-08-03 DIAGNOSIS — Z853 Personal history of malignant neoplasm of breast: Secondary | ICD-10-CM

## 2010-08-03 DIAGNOSIS — Z170 Estrogen receptor positive status [ER+]: Secondary | ICD-10-CM

## 2010-08-03 LAB — CBC WITH DIFFERENTIAL/PLATELET
Eosinophils Absolute: 0.6 10*3/uL — ABNORMAL HIGH (ref 0.0–0.5)
MCV: 100.5 fL (ref 79.5–101.0)
MONO%: 16 % — ABNORMAL HIGH (ref 0.0–14.0)
NEUT#: 3.5 10*3/uL (ref 1.5–6.5)
RBC: 3.27 10*6/uL — ABNORMAL LOW (ref 3.70–5.45)
RDW: 21.4 % — ABNORMAL HIGH (ref 11.2–14.5)
WBC: 7.2 10*3/uL (ref 3.9–10.3)
lymph#: 1.9 10*3/uL (ref 0.9–3.3)

## 2010-08-03 LAB — COMPREHENSIVE METABOLIC PANEL
AST: 18 U/L (ref 0–37)
Albumin: 3.8 g/dL (ref 3.5–5.2)
Alkaline Phosphatase: 54 U/L (ref 39–117)
Glucose, Bld: 122 mg/dL — ABNORMAL HIGH (ref 70–99)
Potassium: 4.1 mEq/L (ref 3.5–5.3)
Sodium: 133 mEq/L — ABNORMAL LOW (ref 135–145)
Total Protein: 5.7 g/dL — ABNORMAL LOW (ref 6.0–8.3)

## 2010-08-05 ENCOUNTER — Other Ambulatory Visit: Payer: Self-pay | Admitting: Internal Medicine

## 2010-08-05 ENCOUNTER — Other Ambulatory Visit: Payer: Self-pay | Admitting: Oncology

## 2010-08-17 ENCOUNTER — Encounter (HOSPITAL_BASED_OUTPATIENT_CLINIC_OR_DEPARTMENT_OTHER): Payer: Medicare Other | Admitting: Oncology

## 2010-08-17 DIAGNOSIS — C184 Malignant neoplasm of transverse colon: Secondary | ICD-10-CM

## 2010-08-17 DIAGNOSIS — Z853 Personal history of malignant neoplasm of breast: Secondary | ICD-10-CM

## 2010-10-11 ENCOUNTER — Encounter: Payer: Self-pay | Admitting: Family Medicine

## 2010-10-11 ENCOUNTER — Ambulatory Visit (INDEPENDENT_AMBULATORY_CARE_PROVIDER_SITE_OTHER): Payer: Medicare Other | Admitting: Family Medicine

## 2010-10-11 DIAGNOSIS — Z23 Encounter for immunization: Secondary | ICD-10-CM

## 2010-10-11 DIAGNOSIS — R209 Unspecified disturbances of skin sensation: Secondary | ICD-10-CM

## 2010-10-11 DIAGNOSIS — R200 Anesthesia of skin: Secondary | ICD-10-CM

## 2010-10-11 NOTE — Patient Instructions (Signed)
Return if the numbness becomes persistent and will not go way

## 2010-10-11 NOTE — Progress Notes (Signed)
  Subjective:    Patient ID: [name], female    DOB: 08-25-1920, 77 y.o.   MRN: [id number]  HPI Alleyne is an 77 year old, widowed female, who comes in today for evaluation of numbness in her left arm for a month.  She notices when she moves her shoulder in a certain way.  Her left arm feels numb and tingly and then goes away.  No history of cardiac or pulmonary symptoms.  She would also like a flu shot today   Review of Systems    General musculoskeletal, neurologic, cardiovascular, review of systems otherwise negative Objective:   Physical Exam  Well-developed well-nourished, female, in no acute distress.  Examination of the left arm shows the skin to be normal.  Shoulder elbow, hand, and normal.  Neurologic exam normal.  Pulse normal, except when she raises her arm it slightly diminishes.      Assessment & Plan:  Numbness left arm, secondary to probable subclavian steal syndrome.  Plan reassured return if numbness becomes persistent

## 2010-12-15 ENCOUNTER — Other Ambulatory Visit: Payer: Self-pay | Admitting: *Deleted

## 2010-12-15 DIAGNOSIS — C189 Malignant neoplasm of colon, unspecified: Secondary | ICD-10-CM

## 2010-12-16 ENCOUNTER — Ambulatory Visit (HOSPITAL_BASED_OUTPATIENT_CLINIC_OR_DEPARTMENT_OTHER): Payer: Medicare Other | Admitting: Oncology

## 2010-12-16 ENCOUNTER — Encounter (INDEPENDENT_AMBULATORY_CARE_PROVIDER_SITE_OTHER): Payer: Self-pay | Admitting: General Surgery

## 2010-12-16 ENCOUNTER — Other Ambulatory Visit (HOSPITAL_BASED_OUTPATIENT_CLINIC_OR_DEPARTMENT_OTHER): Payer: Medicare Other | Admitting: Lab

## 2010-12-16 ENCOUNTER — Telehealth: Payer: Self-pay | Admitting: Oncology

## 2010-12-16 VITALS — BP 166/88 | HR 86 | Temp 97.3°F | Ht 64.0 in | Wt 163.9 lb

## 2010-12-16 DIAGNOSIS — C189 Malignant neoplasm of colon, unspecified: Secondary | ICD-10-CM

## 2010-12-16 DIAGNOSIS — I10 Essential (primary) hypertension: Secondary | ICD-10-CM

## 2010-12-16 DIAGNOSIS — M7989 Other specified soft tissue disorders: Secondary | ICD-10-CM

## 2010-12-16 DIAGNOSIS — C184 Malignant neoplasm of transverse colon: Secondary | ICD-10-CM

## 2010-12-16 DIAGNOSIS — M129 Arthropathy, unspecified: Secondary | ICD-10-CM

## 2010-12-16 LAB — CEA: CEA: 1.3 ng/mL (ref 0.0–5.0)

## 2010-12-16 NOTE — Progress Notes (Signed)
Addended by: Wandalee Ferdinand on: 12/16/2010 04:38 PM   Modules accepted: Orders

## 2010-12-16 NOTE — Telephone Encounter (Signed)
gve the pt her June 2013 appt calendar 

## 2010-12-16 NOTE — Progress Notes (Signed)
OFFICE PROGRESS NOTE   INTERVAL HISTORY:   She returns as scheduled. She denies abdominal pain and difficulty with bowel or bladder function. Her only complaint is "arthritis" discomfort. She has chronic leg swelling on the left greater than right. She takes furosemide.  Objective:  Vital signs in last 24 hours:  Blood pressure 166/88, pulse 86, temperature 97.3 F (36.3 C), height 5\' 4"  (1.626 m), weight 163 lb 14.4 oz (74.345 kg).    HEENT: Neck without mass Lymphatics: No cervical, supraclavicular, axillary, or inguinal lymph nodes Resp: End inspiratory rhonchi/rails at the posterior base bilaterally. No respiratory distress Cardio: Regular rate and rhythm GI: The abdomen is soft and nontender. No hepatomegaly. No mass. Vascular: The left lower leg is slightly larger than the right side. No edema  Skin: There are chronic stasis changes and changes of senile purpura at the low leg bilaterally.     Lab Results:  CEA pending  Medications: I have reviewed the patient's current medications.  Assessment/Plan: 1. 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  She began cycle #1 of adjuvant Xeloda on 06/28/2010.  She began cycle #2 on 07/14/2010 with a dose reduction of the Xeloda.  She completed 1 week of cycle #2 prior to stopping Xeloda. 2. Diarrhea, resolved.  The diarrhea may have been related to Xeloda toxicity, the bowel resection or an infectious process.  A stool sample for the C difficile toxin was negative. 3. History of hypertension. 4. Glaucoma. 5. Benign esophageal stricture, status post an esophageal dilatation 06/08/2010.    Disposition:   She appears well. There is no clinical evidence of recurrent colon cancer. She would like to continue followup at the cancer Center. She will return for an office visit and CEA in 6 months.   Lucile Shutters, MD  12/16/2010  3:53 PM

## 2010-12-20 ENCOUNTER — Ambulatory Visit (INDEPENDENT_AMBULATORY_CARE_PROVIDER_SITE_OTHER): Payer: Medicare Other | Admitting: Family Medicine

## 2010-12-20 ENCOUNTER — Encounter: Payer: Self-pay | Admitting: Family Medicine

## 2010-12-20 DIAGNOSIS — M25562 Pain in left knee: Secondary | ICD-10-CM

## 2010-12-20 DIAGNOSIS — H9190 Unspecified hearing loss, unspecified ear: Secondary | ICD-10-CM

## 2010-12-20 DIAGNOSIS — M25512 Pain in left shoulder: Secondary | ICD-10-CM

## 2010-12-20 DIAGNOSIS — M1990 Unspecified osteoarthritis, unspecified site: Secondary | ICD-10-CM

## 2010-12-20 NOTE — Progress Notes (Signed)
  Subjective:    Patient ID: [name], female    DOB: 1920-09-13, 77 y.o.   MRN: [id number]  HPIFrances is an 77 year old divorced female, nonsmoker, who comes in today for evaluation of 3 problems.  Last Thursday.  She awoke with left shoulder pain.  She says is constant, throbbing, and she took a Percocet, but it made her too sleepy.  The Percocet were given to her when she was discharged from hospital after she had a partial colectomy for colon cancer.  She previously had surgery on that left shoulder about 12 years ago.  No history of trauma.  Cardiopulmonary he is systems negative.  For 3, days.  She's had soreness in her left knee.  No swelling no redness.  She has bilateral hearing aids.  Despite that, I have to shout to make her hear    Review of Systems     General cardiopulmonary neuromuscular review of systems otherwise negative Objective:   Physical Exam  Well-developed well-nourished, female profoundly deaf.  He even with the hearing aids in place.  Musculoskeletal exam negative      Assessment & Plan:  Osteoarthritis with pain in left shoulder and left knee.  Plan Motrin 400 mg b.i.d. Refer back to orthopedist.  Profound deafness, go to the [facility] hearing aid department for evaluation

## 2010-12-20 NOTE — Patient Instructions (Addendum)
Take Motrin 400 mg twice daily with food.  Call your orthopedist, for evaluation.  I would recommend that you go to [facility]  for a hearing aid evaluation  You could take one fourth of a pain pill at bedtime.  If the pain is so severe, you can't sleep

## 2010-12-22 ENCOUNTER — Telehealth: Payer: Self-pay | Admitting: *Deleted

## 2010-12-22 NOTE — Telephone Encounter (Signed)
Patient notified that CEA is normal

## 2010-12-22 NOTE — Telephone Encounter (Signed)
Message copied by Wandalee Ferdinand on Wed Dec 22, 2010 10:11 AM ------      Message from: Thornton Papas B      Created: Mon Dec 20, 2010  8:06 PM       Please call patient, CEA is normal

## 2011-01-11 ENCOUNTER — Other Ambulatory Visit: Payer: Self-pay | Admitting: *Deleted

## 2011-01-11 MED ORDER — FUROSEMIDE 20 MG PO TABS: 20.0000 mg | ORAL_TABLET | Freq: Every day | ORAL | Status: DC

## 2011-01-13 ENCOUNTER — Other Ambulatory Visit: Payer: Self-pay | Admitting: *Deleted

## 2011-01-13 MED ORDER — FUROSEMIDE 20 MG PO TABS: 20.0000 mg | ORAL_TABLET | Freq: Every day | ORAL | Status: DC

## 2011-02-07 ENCOUNTER — Other Ambulatory Visit: Payer: Self-pay | Admitting: Family Medicine

## 2011-05-05 ENCOUNTER — Other Ambulatory Visit: Payer: Self-pay | Admitting: Family Medicine

## 2011-05-05 DIAGNOSIS — Z1231 Encounter for screening mammogram for malignant neoplasm of breast: Secondary | ICD-10-CM

## 2011-05-05 DIAGNOSIS — Z853 Personal history of malignant neoplasm of breast: Secondary | ICD-10-CM

## 2011-05-16 ENCOUNTER — Telehealth: Payer: Self-pay | Admitting: Oncology

## 2011-05-16 NOTE — Telephone Encounter (Signed)
Pt dtr called to r/s 6/7 appt to 6/13.

## 2011-05-18 ENCOUNTER — Other Ambulatory Visit: Payer: Self-pay | Admitting: Family Medicine

## 2011-05-20 ENCOUNTER — Other Ambulatory Visit: Payer: Self-pay | Admitting: *Deleted

## 2011-05-20 MED ORDER — DILTIAZEM HCL 60 MG PO TABS: 60.0000 mg | ORAL_TABLET | Freq: Every day | ORAL | Status: DC

## 2011-05-24 ENCOUNTER — Ambulatory Visit
Admission: RE | Admit: 2011-05-24 | Discharge: 2011-05-24 | Disposition: A | Discharge status: Home / Self Care | Payer: Medicare Other | Source: Ambulatory Visit | Attending: Family Medicine | Admitting: Family Medicine

## 2011-05-24 DIAGNOSIS — Z1231 Encounter for screening mammogram for malignant neoplasm of breast: Secondary | ICD-10-CM

## 2011-05-24 DIAGNOSIS — Z853 Personal history of malignant neoplasm of breast: Secondary | ICD-10-CM

## 2011-05-26 ENCOUNTER — Other Ambulatory Visit: Payer: Self-pay | Admitting: Family Medicine

## 2011-06-10 ENCOUNTER — Ambulatory Visit: Payer: Medicare Other | Admitting: Oncology

## 2011-06-10 ENCOUNTER — Other Ambulatory Visit: Payer: Medicare Other | Admitting: Lab

## 2011-06-16 ENCOUNTER — Other Ambulatory Visit (HOSPITAL_BASED_OUTPATIENT_CLINIC_OR_DEPARTMENT_OTHER): Payer: Medicare Other | Admitting: Lab

## 2011-06-16 ENCOUNTER — Ambulatory Visit (HOSPITAL_BASED_OUTPATIENT_CLINIC_OR_DEPARTMENT_OTHER): Payer: Medicare Other | Admitting: Oncology

## 2011-06-16 ENCOUNTER — Telehealth: Payer: Self-pay | Admitting: Family Medicine

## 2011-06-16 VITALS — BP 182/76 | HR 77 | Temp 97.7°F | Ht 64.0 in | Wt 166.3 lb

## 2011-06-16 DIAGNOSIS — I10 Essential (primary) hypertension: Secondary | ICD-10-CM

## 2011-06-16 DIAGNOSIS — C184 Malignant neoplasm of transverse colon: Secondary | ICD-10-CM

## 2011-06-16 DIAGNOSIS — C189 Malignant neoplasm of colon, unspecified: Secondary | ICD-10-CM

## 2011-06-16 NOTE — Telephone Encounter (Signed)
Patient called stating that her Oncologist checked her bp and it was elevated and would like to know if she may increase her bp meds. Please advise.

## 2011-06-16 NOTE — Telephone Encounter (Signed)
Left detailed message on machine for patient 

## 2011-06-16 NOTE — Patient Instructions (Signed)
Your BP is recording high in clinic today. Call Dr. Tawanna Cooler to make him aware and resume home monitoring of your BP.

## 2011-06-16 NOTE — Progress Notes (Signed)
   [facility]    OFFICE PROGRESS NOTE   INTERVAL HISTORY:   She returns as scheduled. She feels well. No difficulty with bowel function, no abdominal pain, good appetite.  Objective:  Vital signs in last 24 hours:  Blood pressure 182/76, pulse 77, temperature 97.7 F (36.5 C), temperature source Oral, height 5\' 4"  (1.626 m), weight 166 lb 4.8 oz (75.433 kg).    HEENT: Neck without mass Lymphatics: No cervical, supraclavicular, axillary, or inguinal nodes Resp: End inspiratory rhonchi at the posterior basis, no respiratory distress Cardio: Regular rate and rhythm GI: No hepatomegaly, no mass, nontender Vascular: No leg edema, enlargement of the left lower leg compared to the right side-chronic   Lab Results: CEA 1.2  06/16/2011   Medications: I have reviewed the patient's current medications.  Assessment/Plan: 1. 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 She began cycle #1 of adjuvant Xeloda on 06/28/2010. She began cycle #2 on 07/14/2010 with a dose reduction of the Xeloda. She completed 1 week of cycle #2 prior to stopping Xeloda. 2. Diarrhea, resolved. The diarrhea may have been related to Xeloda toxicity, the bowel resection or an infectious process. A stool sample for the C difficile toxin was negative. 3. Hypertension 4. Glaucoma. 5. Benign esophageal stricture, status post an esophageal dilatation 06/08/2010.    Disposition:  She remains in clinical remission from colon cancer. We discussed the indication for obtaining a surveillance colonoscopy. The patient and her daughter will discuss this further and decide on scheduling an appointment with Dr. Juanda Chance. I do not feel strongly that she needs further colonoscopy screening. The chance of systemic relapse appears higher than developing a new cancer.  Mr. Pappalardo plans to continue clinical followup with Dr. Tawanna Cooler. We did  not schedule a followup appointment at cancer Center. I will be glad to see her in the future as needed.   Thornton Papas, MD  06/16/2011  6:00 PM

## 2011-06-16 NOTE — Telephone Encounter (Signed)
Fleet Contras have her check her blood pressure daily in the morning for 2 weeks and then come into the office with the data and the device

## 2011-06-21 ENCOUNTER — Telehealth: Payer: Self-pay | Admitting: Medical Oncology

## 2011-06-30 ENCOUNTER — Encounter: Payer: Self-pay | Admitting: Family Medicine

## 2011-06-30 ENCOUNTER — Ambulatory Visit (INDEPENDENT_AMBULATORY_CARE_PROVIDER_SITE_OTHER): Payer: Medicare Other | Admitting: Family Medicine

## 2011-06-30 VITALS — BP 140/70 | Temp 98.2°F | Wt 165.0 lb

## 2011-06-30 DIAGNOSIS — I10 Essential (primary) hypertension: Secondary | ICD-10-CM

## 2011-06-30 NOTE — Progress Notes (Signed)
  Subjective:    Patient ID: [name], female    DOB: [birth year], 78 y.o.   MRN: [id number]  HPI [name] is an 78 year old female who comes in today accompanied by her daughter for evaluation of hypertension  She has been compliant with taking her medications daily however when she was at the oncology office recently her blood pressure was checked 4 times and it was 180/90.  BP today by me and Fleet Contras 140/70 pulse 70 and regular I also checked her home cuff and it is accurate   Review of Systems    general and cardiovascular review of systems otherwise negative Objective:   Physical Exam Well-developed well-nourished female in acute distress BP right arm sitting position 140/70       Assessment & Plan:

## 2011-06-30 NOTE — Patient Instructions (Signed)
Continue your current blood pressure medications daily  Check your blood pressure weekly at home  Goal 140/90 range  Followup May 2014 sooner if any problems

## 2011-07-06 ENCOUNTER — Telehealth: Payer: Self-pay | Admitting: *Deleted

## 2011-07-06 NOTE — Telephone Encounter (Signed)
Left message on voicemail for pt. CEA was normal when checked in June.

## 2011-08-15 ENCOUNTER — Telehealth: Payer: Self-pay | Admitting: Family Medicine

## 2011-08-15 DIAGNOSIS — H9190 Unspecified hearing loss, unspecified ear: Secondary | ICD-10-CM

## 2011-08-15 NOTE — Telephone Encounter (Signed)
ok 

## 2011-08-15 NOTE — Telephone Encounter (Signed)
Referral request sent 

## 2011-08-15 NOTE — Telephone Encounter (Signed)
Pt called is needing to get a complete hearing test done at [facility]- Dr Joanne Chars. Fax # [phone].

## 2011-08-25 ENCOUNTER — Other Ambulatory Visit: Payer: Self-pay | Admitting: Family Medicine

## 2011-08-25 ENCOUNTER — Encounter: Payer: Self-pay | Admitting: Family Medicine

## 2011-08-25 ENCOUNTER — Ambulatory Visit (INDEPENDENT_AMBULATORY_CARE_PROVIDER_SITE_OTHER): Payer: Medicare Other | Admitting: Family Medicine

## 2011-08-25 ENCOUNTER — Ambulatory Visit (INDEPENDENT_AMBULATORY_CARE_PROVIDER_SITE_OTHER)
Admission: RE | Admit: 2011-08-25 | Discharge: 2011-08-25 | Disposition: A | Discharge status: Home / Self Care | Payer: Medicare Other | Source: Ambulatory Visit | Attending: Family Medicine | Admitting: Family Medicine

## 2011-08-25 VITALS — BP 128/76 | HR 94 | Temp 98.2°F | Wt 167.0 lb

## 2011-08-25 DIAGNOSIS — J40 Bronchitis, not specified as acute or chronic: Secondary | ICD-10-CM

## 2011-08-25 MED ORDER — AZITHROMYCIN 250 MG PO TABS: ORAL_TABLET | ORAL | Status: AC

## 2011-08-25 NOTE — Progress Notes (Signed)
  Subjective:    Patient ID: [name], female    DOB: May 01, 1920, 78 y.o.   MRN: [id number]  HPI Here with her daughter for 2 days of stuffy head, chest congestion, a dry cough, chills, and weakness. No documented fevers. No NVD. Drinking plenty of fluids. No chest pain or SOB. She is an ex-smoker.    Review of Systems  Constitutional: Positive for chills. Negative for fever and diaphoresis.  HENT: Positive for congestion and postnasal drip.   Eyes: Negative.   Respiratory: Positive for cough and chest tightness. Negative for shortness of breath and wheezing.   Cardiovascular: Negative.        Objective:   Physical Exam  Constitutional: She appears well-developed and well-nourished.       Alert, a bit unsteady on her feet   HENT:  Right Ear: External ear normal.  Left Ear: External ear normal.  Nose: Nose normal.  Mouth/Throat: Oropharynx is clear and moist. No oropharyngeal exudate.  Eyes: Conjunctivae are normal.  Neck: No thyromegaly present.  Cardiovascular: Normal rate, regular rhythm, normal heart sounds and intact distal pulses.   Pulmonary/Chest: Effort normal. No respiratory distress. She has no wheezes.       Soft scattered rhonchi and positive rales in the right posterior base   Lymphadenopathy:    She has no cervical adenopathy.          Assessment & Plan:  Bronchitis with possible pneumonia. Treat with a Zpack and Robitussin. Get a CXR today

## 2011-11-07 ENCOUNTER — Encounter: Payer: Self-pay | Admitting: *Deleted

## 2011-11-07 ENCOUNTER — Telehealth: Payer: Self-pay | Admitting: Internal Medicine

## 2011-11-07 DIAGNOSIS — R1310 Dysphagia, unspecified: Secondary | ICD-10-CM

## 2011-11-07 NOTE — Telephone Encounter (Signed)
Please obtain Barium esophagram, r/o stricture vs presbyesophagus. Please make sure she is taking a PPI daily.

## 2011-11-07 NOTE — Telephone Encounter (Signed)
Patient's daughter calling to report patient was in [place] this weekend for a funeral. She was eating steak and began to "act like she was choking." Patient could talk and was coughing. She went to ED and after drinking water she did get the meat down. ER MD told her to contact her GI MD because it could happen again. Daughter states her mother had also been drinking a lot of alcohol that night and she wonders if this contributed to the episode. Patient has been chewing food well since episode. Also, suggested patient eat soft foods and avoid alcohol until OV. Scheduled patient with Mike Gip, PA on 11/09/11 at 2:00 pm for evaluation. Hx-stricture distal esophagus, hiatal hernia

## 2011-11-08 MED ORDER — PANTOPRAZOLE SODIUM 40 MG PO TBEC: 40.0000 mg | DELAYED_RELEASE_TABLET | Freq: Every day | ORAL | Status: DC

## 2011-11-08 NOTE — Telephone Encounter (Signed)
Left a message for patient's daughter to call me. 

## 2011-11-08 NOTE — Addendum Note (Signed)
Addended by: Daphine Deutscher on: 11/08/2011 04:40 PM   Modules accepted: Orders

## 2011-11-08 NOTE — Telephone Encounter (Signed)
Spoke with patient's daughter and patient had not been taking PPI. Rx sent for Protonix 40 mg po daily and gave her radiology appointment date/time/instructions.

## 2011-11-08 NOTE — Telephone Encounter (Signed)
Scheduled barium esophagram at [facility] radiology ([facility]) on 11/11/11 9:15/9:30 AM. No prep needed. Left a message for patient's daughter to call me.

## 2011-11-08 NOTE — Addendum Note (Signed)
Addended by: Daphine Deutscher on: 11/08/2011 01:31 PM   Modules accepted: Orders

## 2011-11-09 ENCOUNTER — Ambulatory Visit: Payer: Medicare Other | Admitting: Physician Assistant

## 2011-11-11 ENCOUNTER — Ambulatory Visit (HOSPITAL_COMMUNITY)
Admission: RE | Admit: 2011-11-11 | Discharge: 2011-11-11 | Disposition: A | Discharge status: Home / Self Care | Payer: Medicare Other | Source: Ambulatory Visit | Attending: Internal Medicine | Admitting: Internal Medicine

## 2011-11-11 DIAGNOSIS — K222 Esophageal obstruction: Secondary | ICD-10-CM | POA: Insufficient documentation

## 2011-11-11 DIAGNOSIS — R1310 Dysphagia, unspecified: Secondary | ICD-10-CM | POA: Insufficient documentation

## 2011-11-11 DIAGNOSIS — K449 Diaphragmatic hernia without obstruction or gangrene: Secondary | ICD-10-CM | POA: Insufficient documentation

## 2011-11-21 NOTE — Telephone Encounter (Signed)
Opened in error

## 2012-02-18 ENCOUNTER — Other Ambulatory Visit: Payer: Self-pay | Admitting: Family Medicine

## 2012-03-05 ENCOUNTER — Ambulatory Visit (INDEPENDENT_AMBULATORY_CARE_PROVIDER_SITE_OTHER): Payer: Medicare Other | Admitting: Family Medicine

## 2012-03-05 ENCOUNTER — Encounter: Payer: Self-pay | Admitting: Family Medicine

## 2012-03-05 VITALS — BP 120/70 | Temp 97.7°F | Wt 169.0 lb

## 2012-03-05 DIAGNOSIS — R200 Anesthesia of skin: Secondary | ICD-10-CM

## 2012-03-05 DIAGNOSIS — R209 Unspecified disturbances of skin sensation: Secondary | ICD-10-CM

## 2012-03-05 NOTE — Progress Notes (Signed)
Chief Complaint  Patient presents with  . Numbness    HPI:  Patient of Dr. Nelida Meuse, Acute visit for numbness: -has seen PCP for R arm numbness and L arm/shoulder pain in the past -? Subclavian steel syndrome - monitored, advised to see ortho last visit -reports today: had episode 3 days ago of numbness all over her entire body lasting a few seconds - no other sensations at the time, was standing, no LOC or any unusual feeling before or afte - had been dealing with a cold for about a week, no symptoms since -hx of colon cancer - but has decided to quit treatment for this -has had a little bit of cold -denies: CP, SOB, fevers, urinary symptoms, bowel changes, vertigo, dizziness, syncope, vision changes, cough  ROS: See pertinent positives and negatives per HPI.  Past Medical History  Diagnosis Date  . Breast cancer     lumpectomy followed by RT and Tamoxifen x 5 years  . Hypertension   . CAD (coronary artery disease)   . Dermatitis   . Female stress incontinence   . Seborrheic keratosis, inflamed   . Pulmonary fibrosis   . Colon adenocarcinoma 05/19/10  . Hearing loss   . Erosive esophagitis 05/19/10  . Hiatal hernia 05/19/10    5 cm  . Anemia   . Diverticulosis 05/19/10    moderate  . Anxiety   . Family history of malignant neoplasm of gastrointestinal tract     brother  . Stricture esophagus 06/08/10    EGD    Family History  Problem Relation Age of Onset  . Heart disease Mother   . Colon polyps Daughter   . Colon cancer Brother     in 7s  . Breast cancer Daughter   . Kidney failure Father     History   Social History  . Marital Status: Single    Spouse Name: N/A    Number of Children: N/A  . Years of Education: N/A   Occupational History  . retired    Social History Main Topics  . Smoking status: Former Smoker    Types: Cigarettes    Quit date: 01/04/1983  . Smokeless tobacco: Never Used  . Alcohol Use: 2.0 oz/week    4 drink(s) per week  . Drug Use:  No  . Sexually Active: None   Other Topics Concern  . None   Social History Narrative  . None    Current outpatient prescriptions:AZOPT 1 % ophthalmic suspension, Place 1 drop into both eyes daily. , Disp: , Rfl: ;  bimatoprost (LUMIGAN) 0.03 % ophthalmic solution, Place 1 drop into both eyes at bedtime. , Disp: , Rfl: ;  cycloSPORINE (RESTASIS) 0.05 % ophthalmic emulsion, 1 drop 2 (two) times daily., Disp: , Rfl: ;  diltiazem (CARDIZEM) 60 MG tablet, Take 1 tablet (60 mg total) by mouth daily., Disp: 90 tablet, Rfl: 3 fluocinonide cream (LIDEX) 0.05 %, APPLY TOPICALLY 2 TIMES A DAY, Disp: 60 g, Rfl: 3;  furosemide (LASIX) 20 MG tablet, take 1 tablet by mouth once daily, Disp: 90 tablet, Rfl: 3;  KLOR-CON M10 10 MEQ tablet, take 2 tablets by mouth twice a day with food, Disp: 120 tablet, Rfl: 0;  multivitamin (THERAGRAN) per tablet, Take 1 tablet by mouth daily. With iron , Disp: , Rfl:  pantoprazole (PROTONIX) 40 MG tablet, Take 1 tablet (40 mg total) by mouth daily., Disp: 30 tablet, Rfl: 1;  potassium chloride SA (K-DUR,KLOR-CON) 20 MEQ tablet, Take 20 mEq by  mouth every Monday, Wednesday, and Friday. One tab three times a week, Disp: , Rfl:   EXAM:  Filed Vitals:   03/05/12 1338  BP: 120/70  Temp: 97.7 F (36.5 C)  P 82  Body mass index is 28.99 kg/(m^2).  GENERAL: vitals reviewed and listed above, alert, oriented, appears well hydrated and in no acute distress  HEENT: atraumatic, conjunttiva clear, no obvious abnormalities on inspection of external nose and ears  NECK: no obvious masses on inspection, no carotid bruit  LUNGS: clear to auscultation bilaterally, no wheezes, rales or rhonchi, good air movement  CV: HRRR, no peripheral edema  MS: moves all extremities without noticeable abnormality  PSYCH: pleasant and cooperative, no obvious depression or anxiety  ASSESSMENT AND PLAN:  Discussed the following assessment and plan:  Numbness  -normal vitals and exam  today, given brevity of symptoms and unusual distrubution and no other symptoms before or since favor observation and further evaluation if recurs - pt and daughter agreeable -advised routine follow up with her PCP for BP, lipid, CV risks management -Patient advised to return or notify a doctor immediately if symptoms worsen or persist or new concerns arise.  There are no Patient Instructions on file for this visit.   Kriste Basque R.

## 2012-04-10 ENCOUNTER — Encounter: Payer: Self-pay | Admitting: Internal Medicine

## 2012-04-30 ENCOUNTER — Other Ambulatory Visit: Payer: Self-pay

## 2012-04-30 DIAGNOSIS — Z1231 Encounter for screening mammogram for malignant neoplasm of breast: Secondary | ICD-10-CM

## 2012-05-25 ENCOUNTER — Ambulatory Visit: Payer: 59

## 2012-05-29 ENCOUNTER — Ambulatory Visit
Admission: RE | Admit: 2012-05-29 | Discharge: 2012-05-29 | Disposition: A | Discharge status: Home / Self Care | Payer: Medicare Other | Source: Ambulatory Visit

## 2012-05-29 DIAGNOSIS — Z1231 Encounter for screening mammogram for malignant neoplasm of breast: Secondary | ICD-10-CM

## 2012-06-01 ENCOUNTER — Encounter: Payer: Self-pay | Admitting: Internal Medicine

## 2012-06-01 ENCOUNTER — Other Ambulatory Visit (INDEPENDENT_AMBULATORY_CARE_PROVIDER_SITE_OTHER): Payer: Medicare Other

## 2012-06-01 ENCOUNTER — Ambulatory Visit (INDEPENDENT_AMBULATORY_CARE_PROVIDER_SITE_OTHER): Payer: Medicare Other | Admitting: Internal Medicine

## 2012-06-01 VITALS — BP 122/70 | HR 80 | Ht 64.0 in | Wt 169.5 lb

## 2012-06-01 DIAGNOSIS — Z85038 Personal history of other malignant neoplasm of large intestine: Secondary | ICD-10-CM

## 2012-06-01 LAB — CBC WITH DIFFERENTIAL/PLATELET
Basophils Relative: 0.4 % (ref 0.0–3.0)
Eosinophils Relative: 3.1 % (ref 0.0–5.0)
Lymphocytes Relative: 28.3 % (ref 12.0–46.0)
MCV: 97.5 fl (ref 78.0–100.0)
Monocytes Absolute: 1 10*3/uL (ref 0.1–1.0)
Monocytes Relative: 11 % (ref 3.0–12.0)
Neutrophils Relative %: 57.2 % (ref 43.0–77.0)
Platelets: 141 10*3/uL — ABNORMAL LOW (ref 150.0–400.0)
RBC: 3.8 Mil/uL — ABNORMAL LOW (ref 3.87–5.11)
WBC: 8.9 10*3/uL (ref 4.5–10.5)

## 2012-06-01 NOTE — Patient Instructions (Addendum)
Your physician has requested that you go to the basement for the following lab work before leaving today: CBC, CEA level.

## 2012-06-01 NOTE — Progress Notes (Signed)
[name] 1920/04/28 MRN [id number]        History of Present Illness:  This is a 79 year old white female with history of stage III T4 N1 moderately differentiated adenocarcinoma of the colon resected in May 2012, followed by Dr. Truett Mills, she is here to discuss recall colonoscopy. She has no complaints. Her bowel habits are regular without using laxatives. She denies abdominal pain or rectal bleeding. She has been ambulatory and mentally very active.   Past Medical History  Diagnosis Date  . Breast cancer     lumpectomy followed by RT and Tamoxifen x 5 years  . Hypertension   . CAD (coronary artery disease)   . Dermatitis   . Female stress incontinence   . Seborrheic keratosis, inflamed   . Pulmonary fibrosis   . Colon adenocarcinoma 05/19/10  . Hearing loss   . Erosive esophagitis 05/19/10  . Hiatal hernia 05/19/10    5 cm  . Anemia   . Diverticulosis 05/19/10    moderate  . Anxiety   . Family history of malignant neoplasm of gastrointestinal tract     brother  . Stricture esophagus 06/08/10    EGD   Past Surgical History  Procedure Laterality Date  . Abdominal hysterectomy    . Breast lumpectomy    . Shoulder arthroscopy w/ acromial repair      bilateral  . Appendectomy    . Colon resection  05/20/2010    [facility]    reports that she quit smoking about 29 years ago. Her smoking use included Cigarettes. She smoked 0.00 packs per day. She has never used smokeless tobacco. She reports that she drinks about 2.0 ounces of alcohol per week. She reports that she does not use illicit drugs. family history includes Breast cancer in her daughter; Colon cancer in her brother; Colon polyps in her daughter; Heart disease in her mother; and Kidney failure in her father. Allergies  Allergen Reactions  . Codeine     REACTION: Stomach cramps  . Indomethacin     REACTION: TIA type episodes  . Penicillins     REACTION: vomit  . Sulfonamide Derivatives     REACTION:  Nausea Vomiting        Review of Systems: History of presbyesophagus esophageal dilation in 2012. Denies abdominal pain or rectal bleeding  The remainder of the 10 point ROS is negative except as outlined in H&P   Physical Exam: General appearance  Well developed, in no distress. Eyes- non icteric. HEENT nontraumatic, normocephalic. Mouth no lesions, tongue papillated, no cheilosis. Neck supple without adenopathy, thyroid not enlarged, no carotid bruits, no JVD. Lungs Clear to auscultation bilaterally thoracic kyphosis. Cor normal S1, normal S2, regular rhythm, no murmur,  quiet precordium. Abdomen: Soft nontender with normoactive bowel sounds Rectal: Normal rectal sphincter tone. Soft Hemoccult negative stool Extremities 1+ edema pedal edema. Skin no lesions. Neurological alert and oriented x 3. Psychological normal mood and affect.  Assessment and Plan:  79 year old white female with the stage III adenocarcinoma of the colon resected 2 years ago. She is doing extremely well and has been released by Dr. Myrle Mills from oncology care. She is Hemoccult-negative. We will check her CBC and CEA level today. She is perfectly comfortable with not having recall colonoscopy which at this point might present more risk than benefit. I will be happy to see her if she develops specific GI problems   06/01/2012 [name]

## 2012-06-02 LAB — CEA: CEA: 2 ng/mL (ref 0.0–5.0)

## 2012-06-04 ENCOUNTER — Telehealth: Payer: Self-pay | Admitting: Family Medicine

## 2012-06-04 DIAGNOSIS — N393 Stress incontinence (female) (male): Secondary | ICD-10-CM

## 2012-06-04 NOTE — Telephone Encounter (Signed)
PT called to request a referral to see a urologist regarding a class that she can take to "teach her how to stop leaking". Please assist.

## 2012-06-08 NOTE — Telephone Encounter (Signed)
Referral request sent 

## 2012-06-21 ENCOUNTER — Encounter: Payer: Self-pay | Admitting: Internal Medicine

## 2012-06-21 ENCOUNTER — Ambulatory Visit (INDEPENDENT_AMBULATORY_CARE_PROVIDER_SITE_OTHER): Payer: Medicare Other | Admitting: Internal Medicine

## 2012-06-21 VITALS — BP 130/80 | HR 74 | Temp 97.7°F | Wt 170.0 lb

## 2012-06-21 DIAGNOSIS — R202 Paresthesia of skin: Secondary | ICD-10-CM

## 2012-06-21 DIAGNOSIS — R209 Unspecified disturbances of skin sensation: Secondary | ICD-10-CM

## 2012-06-21 DIAGNOSIS — I10 Essential (primary) hypertension: Secondary | ICD-10-CM

## 2012-06-21 DIAGNOSIS — I2510 Atherosclerotic heart disease of native coronary artery without angina pectoris: Secondary | ICD-10-CM

## 2012-06-21 DIAGNOSIS — H9190 Unspecified hearing loss, unspecified ear: Secondary | ICD-10-CM

## 2012-06-21 DIAGNOSIS — H8110 Benign paroxysmal vertigo, unspecified ear: Secondary | ICD-10-CM

## 2012-06-21 DIAGNOSIS — H9193 Unspecified hearing loss, bilateral: Secondary | ICD-10-CM

## 2012-06-21 MED ORDER — MECLIZINE HCL 32 MG PO TABS: 32.0000 mg | ORAL_TABLET | Freq: Three times a day (TID) | ORAL | Status: DC | PRN

## 2012-06-21 NOTE — Patient Instructions (Addendum)
Take aspirin 81 mg daily  Hold Myrbetriq  Call or return to clinic prn if these symptoms worsen or fail to improve as anticipated.

## 2012-06-21 NOTE — Progress Notes (Signed)
Subjective:    Patient ID: [name], female    DOB: 1920/08/08, 79 y.o.   MRN: [id number]  HPI  79 year old patient who was evaluated today as a work in due 2 dizziness. She will dysphoric feeling well but later in the morning developed the sense of being off balance. She states she has had similar vertigo in the past. She also describes a sense of tingling in the hands and arms bilaterally that has not occurred in the past. Review of her problem list does reveal a history of left arm numbness. She does have a history of coronary artery disease. She has treated hypertension. She has been treated recently for overactive bladder with Myrbetrig.  Past Medical History  Diagnosis Date  . Breast cancer     lumpectomy followed by RT and Tamoxifen x 5 years  . Hypertension   . CAD (coronary artery disease)   . Dermatitis   . Female stress incontinence   . Seborrheic keratosis, inflamed   . Pulmonary fibrosis   . Colon adenocarcinoma 05/19/10  . Hearing loss   . Erosive esophagitis 05/19/10  . Hiatal hernia 05/19/10    5 cm  . Anemia   . Diverticulosis 05/19/10    moderate  . Anxiety   . Family history of malignant neoplasm of gastrointestinal tract     brother  . Stricture esophagus 06/08/10    EGD    History   Social History  . Marital Status: Single    Spouse Name: N/A    Number of Children: N/A  . Years of Education: N/A   Occupational History  . retired    Social History Main Topics  . Smoking status: Former Smoker    Types: Cigarettes    Quit date: 01/04/1983  . Smokeless tobacco: Never Used  . Alcohol Use: 2.0 oz/week    4 drink(s) per week  . Drug Use: No  . Sexually Active: Not on file   Other Topics Concern  . Not on file   Social History Narrative  . No narrative on file    Past Surgical History  Procedure Laterality Date  . Abdominal hysterectomy    . Breast lumpectomy    . Shoulder arthroscopy w/ acromial repair      bilateral  . Appendectomy     . Colon resection  05/20/2010    [facility]    Family History  Problem Relation Age of Onset  . Heart disease Mother   . Colon polyps Daughter   . Colon cancer Brother     in 44s  . Breast cancer Daughter   . Kidney failure Father     Allergies  Allergen Reactions  . Codeine     REACTION: Stomach cramps  . Indomethacin     REACTION: TIA type episodes  . Penicillins     REACTION: vomit  . Sulfonamide Derivatives     REACTION: Nausea Vomiting    Current Outpatient Prescriptions on File Prior to Visit  Medication Sig Dispense Refill  . AZOPT 1 % ophthalmic suspension Place 1 drop into both eyes daily.       . bimatoprost (LUMIGAN) 0.03 % ophthalmic solution Place 1 drop into both eyes at bedtime.       . cycloSPORINE (RESTASIS) 0.05 % ophthalmic emulsion 1 drop 2 (two) times daily.      Marland Kitchen diltiazem (CARDIZEM) 60 MG tablet Take 1 tablet (60 mg total) by mouth daily.  90 tablet  3  . fluocinonide cream (  LIDEX) 0.05 % APPLY TOPICALLY 2 TIMES A DAY  60 g  3  . furosemide (LASIX) 20 MG tablet take 1 tablet by mouth once daily  90 tablet  3  . multivitamin (THERAGRAN) per tablet Take 1 tablet by mouth daily. With iron       . pantoprazole (PROTONIX) 40 MG tablet Take 1 tablet (40 mg total) by mouth daily.  30 tablet  1  . potassium chloride SA (K-DUR,KLOR-CON) 20 MEQ tablet Take 20 mEq by mouth every Monday, Wednesday, and Friday. One tab three times a week       No current facility-administered medications on file prior to visit.    BP 130/80  Pulse 74  Temp(Src) 97.7 F (36.5 C) (Oral)  Wt 170 lb (77.111 kg)  BMI 29.17 kg/m2  SpO2 97%       Review of Systems  Constitutional: Negative.   HENT: Negative for hearing loss, congestion, sore throat, rhinorrhea, dental problem, sinus pressure and tinnitus.   Eyes: Negative for pain, discharge and visual disturbance.  Respiratory: Negative for cough and shortness of breath.   Cardiovascular: Negative for chest pain,  palpitations and leg swelling.  Gastrointestinal: Negative for nausea, vomiting, abdominal pain, diarrhea, constipation, blood in stool and abdominal distention.  Genitourinary: Negative for dysuria, urgency, frequency, hematuria, flank pain, vaginal bleeding, vaginal discharge, difficulty urinating, vaginal pain and pelvic pain.  Musculoskeletal: Negative for joint swelling, arthralgias and gait problem.  Skin: Negative for rash.  Neurological: Positive for dizziness, light-headedness and numbness. Negative for syncope, speech difficulty, weakness and headaches.  Hematological: Negative for adenopathy.  Psychiatric/Behavioral: Negative for behavioral problems, dysphoric mood and agitation. The patient is not nervous/anxious.        Objective:   Physical Exam  Constitutional: She is oriented to person, place, and time. She appears well-developed and well-nourished.  HENT:  Head: Normocephalic.  Right Ear: External ear normal.  Left Ear: External ear normal.  Mouth/Throat: Oropharynx is clear and moist.  Hearing aids bilaterally  Eyes: Conjunctivae and EOM are normal. Pupils are equal, round, and reactive to light.  Neck: Normal range of motion. Neck supple. No thyromegaly present.  Cardiovascular: Normal rate, regular rhythm, normal heart sounds and intact distal pulses.   Pulmonary/Chest: Effort normal and breath sounds normal.  Abdominal: Soft. Bowel sounds are normal. She exhibits no mass. There is no tenderness.  Musculoskeletal: Normal range of motion.  Lymphadenopathy:    She has no cervical adenopathy.  Neurological: She is alert and oriented to person, place, and time. No cranial nerve deficit.  Finger-nose testing performed well No drift of the outstretched arms Gait a bit unsteady but unremarkable for age. No gross ataxia  Grip strength normal  No sensory deficits noted involving her arms  Skin: Skin is warm and dry. No rash noted.  Psychiatric: She has a normal mood  and affect. Her behavior is normal.          Assessment & Plan:   Vertigo. This has been a symptom in the past. Will place on baby aspirin 81 mg daily she does have coronary artery disease Paresthesias of the arms  will observe. Neuro exam nonfocal Hypertension stable Coronary artery disease

## 2012-06-23 ENCOUNTER — Other Ambulatory Visit: Payer: Self-pay | Admitting: Family Medicine

## 2012-08-09 ENCOUNTER — Other Ambulatory Visit: Payer: Self-pay | Admitting: Family Medicine

## 2012-09-04 ENCOUNTER — Other Ambulatory Visit: Payer: Self-pay | Admitting: Family Medicine

## 2012-10-01 ENCOUNTER — Telehealth: Payer: Self-pay | Admitting: Cardiology

## 2012-10-01 NOTE — Telephone Encounter (Signed)
Pt had MI flying back from [place] and made an emergency stop in [place]. Pt will be arriving to [facility] ED tomorrow 9/30 @ 3:30pm to be evaluated and may be able to be DC'd per family member/ pt doing well. Per insurance/ pt has to report from airplane to ED via ambulance.  Family member was told I will notify DOD of hospital and Dr Antoine Poche.

## 2012-10-01 NOTE — Telephone Encounter (Signed)
I left a message at both #'s to call.

## 2012-10-01 NOTE — Telephone Encounter (Signed)
Pt coming home from [place] tomorrow and dr hochrein talked to her dr there to discuss her condition, her son on law wants consult set up today for tomorrow

## 2012-10-02 ENCOUNTER — Emergency Department (HOSPITAL_COMMUNITY): Payer: Medicare Other

## 2012-10-02 ENCOUNTER — Observation Stay (HOSPITAL_COMMUNITY)
Admission: EM | Admit: 2012-10-02 | Discharge: 2012-10-03 | Disposition: A | Discharge status: Home / Self Care | Payer: Medicare Other | Attending: Cardiovascular Disease | Admitting: Cardiovascular Disease

## 2012-10-02 ENCOUNTER — Telehealth: Payer: Self-pay | Admitting: Cardiology

## 2012-10-02 ENCOUNTER — Other Ambulatory Visit: Payer: Self-pay

## 2012-10-02 ENCOUNTER — Encounter (HOSPITAL_COMMUNITY): Payer: Self-pay | Admitting: Adult Health

## 2012-10-02 DIAGNOSIS — R200 Anesthesia of skin: Secondary | ICD-10-CM

## 2012-10-02 DIAGNOSIS — Z79899 Other long term (current) drug therapy: Secondary | ICD-10-CM | POA: Insufficient documentation

## 2012-10-02 DIAGNOSIS — Z7902 Long term (current) use of antithrombotics/antiplatelets: Secondary | ICD-10-CM | POA: Insufficient documentation

## 2012-10-02 DIAGNOSIS — I10 Essential (primary) hypertension: Secondary | ICD-10-CM

## 2012-10-02 DIAGNOSIS — C186 Malignant neoplasm of descending colon: Secondary | ICD-10-CM

## 2012-10-02 DIAGNOSIS — I2510 Atherosclerotic heart disease of native coronary artery without angina pectoris: Secondary | ICD-10-CM

## 2012-10-02 DIAGNOSIS — R0609 Other forms of dyspnea: Secondary | ICD-10-CM | POA: Insufficient documentation

## 2012-10-02 DIAGNOSIS — K219 Gastro-esophageal reflux disease without esophagitis: Secondary | ICD-10-CM | POA: Insufficient documentation

## 2012-10-02 DIAGNOSIS — I214 Non-ST elevation (NSTEMI) myocardial infarction: Principal | ICD-10-CM

## 2012-10-02 DIAGNOSIS — J8410 Pulmonary fibrosis, unspecified: Secondary | ICD-10-CM | POA: Insufficient documentation

## 2012-10-02 DIAGNOSIS — Z853 Personal history of malignant neoplasm of breast: Secondary | ICD-10-CM | POA: Insufficient documentation

## 2012-10-02 DIAGNOSIS — F411 Generalized anxiety disorder: Secondary | ICD-10-CM | POA: Insufficient documentation

## 2012-10-02 DIAGNOSIS — Z23 Encounter for immunization: Secondary | ICD-10-CM | POA: Insufficient documentation

## 2012-10-02 DIAGNOSIS — Z85038 Personal history of other malignant neoplasm of large intestine: Secondary | ICD-10-CM | POA: Insufficient documentation

## 2012-10-02 DIAGNOSIS — R0989 Other specified symptoms and signs involving the circulatory and respiratory systems: Secondary | ICD-10-CM | POA: Insufficient documentation

## 2012-10-02 DIAGNOSIS — I4510 Unspecified right bundle-branch block: Secondary | ICD-10-CM | POA: Insufficient documentation

## 2012-10-02 HISTORY — DX: Atrial premature depolarization: I49.1

## 2012-10-02 HISTORY — DX: Gastritis, unspecified, without bleeding: K29.70

## 2012-10-02 HISTORY — DX: Non-ST elevation (NSTEMI) myocardial infarction: I21.4

## 2012-10-02 HISTORY — DX: Malignant neoplasm of colon, unspecified: C18.9

## 2012-10-02 LAB — BASIC METABOLIC PANEL
CO2: 30 mEq/L (ref 19–32)
Calcium: 8.8 mg/dL (ref 8.4–10.5)
Chloride: 97 mEq/L (ref 96–112)
Creatinine, Ser: 0.74 mg/dL (ref 0.50–1.10)
Glucose, Bld: 105 mg/dL — ABNORMAL HIGH (ref 70–99)
Sodium: 134 mEq/L — ABNORMAL LOW (ref 135–145)

## 2012-10-02 LAB — PRO B NATRIURETIC PEPTIDE: Pro B Natriuretic peptide (BNP): 1205 pg/mL — ABNORMAL HIGH (ref 0–450)

## 2012-10-02 LAB — CBC
Hemoglobin: 12.8 g/dL (ref 12.0–15.0)
MCH: 33 pg (ref 26.0–34.0)
MCV: 97.4 fL (ref 78.0–100.0)
Platelets: 136 10*3/uL — ABNORMAL LOW (ref 150–400)
RBC: 3.88 MIL/uL (ref 3.87–5.11)
RDW: 13.7 % (ref 11.5–15.5)
WBC: 10.1 10*3/uL (ref 4.0–10.5)

## 2012-10-02 LAB — POCT I-STAT TROPONIN I: Troponin i, poc: 0.02 ng/mL (ref 0.00–0.08)

## 2012-10-02 MED ORDER — POTASSIUM CHLORIDE ER 10 MEQ PO TBCR
10.0000 meq | EXTENDED_RELEASE_TABLET | ORAL | Status: DC
  Administered 2012-10-03: 10 meq via ORAL
  Filled 2012-10-02: qty 1

## 2012-10-02 MED ORDER — HEPARIN SODIUM (PORCINE) 5000 UNIT/ML IJ SOLN
5000.0000 [IU] | Freq: Three times a day (TID) | INTRAMUSCULAR | Status: DC
  Administered 2012-10-02 – 2012-10-03 (×2): 5000 [IU] via SUBCUTANEOUS
  Filled 2012-10-02 (×5): qty 1

## 2012-10-02 MED ORDER — SODIUM CHLORIDE 0.9 % IJ SOLN
3.0000 mL | Freq: Two times a day (BID) | INTRAMUSCULAR | Status: DC
  Administered 2012-10-02 – 2012-10-03 (×2): 3 mL via INTRAVENOUS

## 2012-10-02 MED ORDER — METOPROLOL TARTRATE 25 MG PO TABS
25.0000 mg | ORAL_TABLET | Freq: Two times a day (BID) | ORAL | Status: DC
  Administered 2012-10-02 – 2012-10-03 (×2): 25 mg via ORAL
  Filled 2012-10-02 (×3): qty 1

## 2012-10-02 MED ORDER — ASPIRIN EC 81 MG PO TBEC
81.0000 mg | DELAYED_RELEASE_TABLET | Freq: Every day | ORAL | Status: DC
  Administered 2012-10-03: 81 mg via ORAL
  Filled 2012-10-02: qty 1

## 2012-10-02 MED ORDER — NITROGLYCERIN 0.4 MG SL SUBL: 0.4000 mg | SUBLINGUAL_TABLET | SUBLINGUAL | Status: DC | PRN

## 2012-10-02 MED ORDER — SODIUM CHLORIDE 0.9 % IJ SOLN: 3.0000 mL | INTRAMUSCULAR | Status: DC | PRN

## 2012-10-02 MED ORDER — ONDANSETRON HCL 4 MG/2ML IJ SOLN: 4.0000 mg | Freq: Four times a day (QID) | INTRAMUSCULAR | Status: DC | PRN

## 2012-10-02 MED ORDER — INFLUENZA VAC SPLIT QUAD 0.5 ML IM SUSP
0.5000 mL | INTRAMUSCULAR | Status: AC
  Administered 2012-10-03: 0.5 mL via INTRAMUSCULAR
  Filled 2012-10-02: qty 0.5

## 2012-10-02 MED ORDER — POTASSIUM CHLORIDE ER 10 MEQ PO TBCR: 10.0000 meq | EXTENDED_RELEASE_TABLET | Freq: Every day | ORAL | Status: DC

## 2012-10-02 MED ORDER — ACETAMINOPHEN 325 MG PO TABS: 650.0000 mg | ORAL_TABLET | Freq: Four times a day (QID) | ORAL | Status: DC | PRN

## 2012-10-02 MED ORDER — SODIUM CHLORIDE 0.9 % IV SOLN: 250.0000 mL | INTRAVENOUS | Status: DC | PRN

## 2012-10-02 MED ORDER — FUROSEMIDE 20 MG PO TABS
20.0000 mg | ORAL_TABLET | Freq: Every day | ORAL | Status: DC
  Administered 2012-10-03: 20 mg via ORAL
  Filled 2012-10-02: qty 1

## 2012-10-02 NOTE — Telephone Encounter (Signed)
Dr Antoine Poche, Dr Elease Hashimoto and Rosann Auerbach all aware

## 2012-10-02 NOTE — ED Notes (Signed)
Presents post cardiac event while on plane flying home from [place] on the 24th, became dyspneic with Sats in high 80s, low 90s, chest pain. Landed plane in [place] and admitted to hospital. PE was ruled out, DX with CHF and Non stemi MI. Pt needs to be cleared here per the travel insurance company. She has no complaints today.

## 2012-10-02 NOTE — Telephone Encounter (Signed)
Follow Up  Pt's son Jamie Kato is returning the call placed by Dr. Jenene Slicker office.  Mother suffered from a heart attack while traveling over the weekend... Needs a call back from the nurse.

## 2012-10-02 NOTE — H&P (Signed)
History & Physical   Patient ID: KAMILE FASSLER MRN: [id number], DOB/AGE: 1920-02-18   Admit date: 10/02/2012 Date of Consult: 10/02/2012  Primary Physician: Evette Georges, MD Primary Cardiologist: Shela Commons. Hochrein, MD  Reason for consult: NSTEMI while overseas  HPI: [name] is a functional 79 y.o. female w/ PMHx s/f CAD (nonobstructive on 2006 cath), pulmonary fibrosis, HTN, h/o breast CA, h/o colon CA, GERD, h/o gastritis and anxiety who presents for formal cardiac evaluation after possible NSTEMI while overseas.   She established care in 2006 with Dr. Ladona Ridgel.  She c/o exertional dyspnea and angina leading to cath (see below). She was hospitalized for dyspnea and presyncope in 08/2005. CT scan indicated no evidence of PE or dissection. Pulmonary fibrosis was identified. She did have evidence of anemia which was managed by her PCP. She did have episodic palpitations attributed to PVCs. Cardizem was started with improvement.  She transitioned care to Dr. Antoine Poche for general cardiac follow-up. Myoview 08/2005 indicated no evidence of ischemia, preserved EF.   She was traveling by airplane from [place]. She went to the lady's room and upon returning to her seat, she reported that she could not breath. No frank chest pain. She was tended to by physician traveling on the plane. She was given supplemental O2 with improvement. The airplane was diverted to [place]. She was admitted at a hospital from Wednesday to today. She flew into [place] today and was taken directly via EMS to [facility] for work-up. Her son called the office yesterday apprising Dr. Antoine Poche + staff of this episode. Taking insurance purposes into account, the recommendation was made to present to the ED immediately from the airport.   Review of inpatient documents at outside hospital revealed MUGA scan- EF 49% "Mild troponin elevation" No cath outlined No evidence of PE (normal D-dimer) Started on Plavix in  addition to ASA  In the ED, EKG reveals NSR, old RBBB, TWIs III, aVF (unchanged from prior 2012 tracing). POC TnI WNL. Pro BNP 1205. BMET- Na 134, BUN 24, Cr 0.74. CBC- PLT 136K, otherwise WNL. CXR w/o active disease. BP 183/82, otherwise VSS.  - echo  - cycle CEs - BNP - Metoprolol for cardizem - Continue ASA/Plavix  Problem List: Past Medical History  Diagnosis Date  . Breast cancer     lumpectomy followed by RT and Tamoxifen x 5 years  . Hypertension   . CAD (coronary artery disease)     a. Mod nonobstructive by cath 2006. b. stress test 2007- no evidence of ischemia; EF 58%  . Dermatitis   . Female stress incontinence   . Seborrheic keratosis, inflamed   . Pulmonary fibrosis   . Colon adenocarcinoma 05/19/10  . Hearing loss   . Erosive esophagitis 05/19/10  . Hiatal hernia 05/19/10    5 cm  . Anemia   . Diverticulosis 05/19/10    moderate  . Anxiety   . Colon cancer     a. 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 Tx with Xeloda.  . Stricture esophagus 06/08/10    EGD  . Gastritis     Past Surgical History  Procedure Laterality Date  . Breast lumpectomy    . Shoulder arthroscopy w/ acromial repair      bilateral  . Appendectomy    . Colon resection  05/20/2010    [facility]  . Total abdominal hysterectomy w/ bilateral salpingoophorectomy    . Cardiac catheterization  2006    40% ostial LAD, 60% mid LAD followed by 30-40% disease, 50% just after D2, 60-70% D2 lesion followed by 50% disease, normal LCx/RCA; EF 55%     Allergies:  Allergies  Allergen Reactions  . Codeine Nausea And Vomiting    REACTION: Stomach cramps  . Indomethacin Other (See Comments)    REACTION: TIA type episodes  . Penicillins Nausea And Vomiting    REACTION: vomit  . Sulfonamide Derivatives Nausea And Vomiting    REACTION: Nausea Vomiting    Home Medications: Prior to Admission medications   Medication  Sig Start Date End Date Taking? Authorizing Provider  aspirin EC 81 MG tablet Take 81 mg by mouth daily.   Yes Historical Provider, MD  clopidogrel (PLAVIX) 75 MG tablet Take 75 mg by mouth daily.   Yes Historical Provider, MD  furosemide (LASIX) 40 MG tablet Take 40 mg by mouth daily.   Yes Historical Provider, MD  Multiple Vitamin (MULTIVITAMIN WITH MINERALS) TABS tablet Take 1 tablet by mouth daily.   Yes Historical Provider, MD  potassium chloride SA (K-DUR,KLOR-CON) 20 MEQ tablet Take 20 mEq by mouth every Monday, Wednesday, and Friday. One tab three times a week   Yes Historical Provider, MD  AZOPT 1 % ophthalmic suspension Place 1 drop into both eyes daily.  11/15/10   Historical Provider, MD  cycloSPORINE (RESTASIS) 0.05 % ophthalmic emulsion 1 drop 2 (two) times daily.    Historical Provider, MD  fluocinonide cream (LIDEX) 0.05 % APPLY TOPICALLY 2 TIMES A DAY 08/09/12   Roderick Pee, MD  meclizine (ANTIVERT) 32 MG tablet Take 1 tablet (32 mg total) by mouth 3 (three) times daily as needed. 06/21/12   Gordy Savers, MD    Inpatient Medications:    (Not in a hospital admission)  Family History  Problem Relation Age of Onset  . Heart disease Mother   . Colon polyps Daughter   . Colon cancer Brother     in 61s  . Breast cancer Daughter   . Kidney failure Father      History   Social History  . Marital Status: Single    Spouse Name: N/A    Number of Children: N/A  . Years of Education: N/A   Occupational History  . retired    Social History Main Topics  . Smoking status: Former Smoker    Types: Cigarettes    Quit date: 01/04/1983  . Smokeless tobacco: Never Used     Comment: Quit 25-30 years ago  . Alcohol Use: 2.0 oz/week    4 drink(s) per week  . Drug Use: No  . Sexual Activity: Not on file   Other Topics Concern  . Not on file   Social History Narrative  . No narrative on file    Review of Systems: General: negative for chills, fever, night sweats  or weight changes.  Cardiovascular: positive shortness of breath, negative for dyspnea on exertion, edema, orthopnea, palpitations, paroxysmal nocturnal dyspnea Dermatological: negative for rash Respiratory: negative for cough or wheezing Urologic: negative for hematuria Abdominal: negative for nausea, vomiting, diarrhea, bright red blood per rectum, melena, or hematemesis Neurologic: negative for visual changes, syncope, or dizziness All other systems reviewed and are otherwise negative except as noted above.  Physical Exam: Blood pressure 183/82, pulse 67, temperature 97.7 F (36.5 C), temperature source Oral, resp. rate 18, SpO2 97.00%.   General:  Well developed, well nourished, in no acute distress. Head:  Normocephalic, atraumatic, sclera  non-icteric, no xanthomas, nares are without discharge.  Neck:  Negative for carotid bruits. JVD not elevated. Lungs: rales in bases, R>>L Heart: RR with S1 S2. No murmurs, rubs, or gallops appreciated. Abdomen:  Soft, non-tender, non-distended with normoactive bowel sounds. No hepatomegaly. No rebound/guarding. No obvious abdominal masses. Msk:   Strength and tone appears normal for age. Extremities: No clubbing, cyanosis or edema.  Distal pedal pulses are 2+ and equal bilaterally. Neuro: Alert and oriented X 3. Moves all extremities spontaneously.  Hard of hearing Psych:   Responds to questions appropriately with a normal affect.  Labs: Recent Labs     10/02/12  1557  WBC  10.1  HGB  12.8  HCT  37.8  MCV  97.4  PLT  136*   Recent Labs Lab 10/02/12 1557  NA 134*  K 4.3  CL 97  CO2 30  BUN 24*  CREATININE 0.74  CALCIUM 8.8  GLUCOSE 105*   Radiology/Studies: Dg Chest 2 View  10/02/2012   CLINICAL DATA:  Chest pain and shortness of breath.  EXAM: CHEST - 2 VIEW  COMPARISON:  08/25/2011  FINDINGS: The heart size is at the upper limits of normal. The aorta is mildly ectatic. No edema, focal infiltrate, nodule or pleural fluid is  identified. The bony thorax shows stable mild degenerative changes of the thoracic spine without evidence of lesion or fracture.  IMPRESSION: No active disease.   Electronically Signed   By: Irish Lack   On: 10/02/2012 16:35   EKG: NSR, 60 bpm, IVCD II, III, aVF, LAD, TWIs III, aVF, no ST changes  ASSESSMENT AND PLAN:   79 y.o. female w/ PMHx s/f CAD (nonobstructive on 2006 cath), pulmonary fibrosis, HTN, h/o breast CA, h/o colon CA, GERD, h/o gastritis and anxiety who presents for formal cardiac evaluation after possible NSTEMI while overseas.   1. Dyspnea, possible NSTEMI  2. CAD  A. Nonobstructive by 2006 cath  B. Myoview w/o evidence of ischemia; EF 58% 2007 3. HTN 4. Chronic RBBB 5. GERD 6. Anxiety 7. H/o gastritis 8. H/o breast CA 9. H/o colon CA  The patient presents to the ED after experiencing an episode of sudden onset dyspnea while flying back from [place]. No precipatory chest discomfort or shortness of breath while traveling in [place] or prior to leaving the [place] Records from the OSH in [place] indicate no evidence of PE. There is a mention of "troponin elevation," although value are unavailable. MUGA scan indicated no abnormalities; EF 49%. She was started on Plavix in addition to ASA. No intervention performed. Our office was called yesterday with these events. Taking travel insurance requests into account, the recommendation was made to present to the ED. She is currently stable in NAD. Initial TnI WNL. EKG w/o ischemic changes- RBBB old, TWIs in inferior leads present on 2012 tracing. CXR w/o active process. Pro BNP mildly elevated. Will plan to cycle cardiac biomarkers. Check 2D echo. Replace diltiazem with metoprolol. Start NTG SL PRN. Will review 2D echo and determine need for further ischemic eval based on these findings. She is currently asymptomatic, stable, no chest pain or shortness of breath.     Signed, R. Hurman Horn, PA-C 10/02/2012, 6:01  PM  Attending Note:   The patient was seen and examined.  Agree with assessment and plan as noted above.  Changes made to the above note as needed.  Pt had a NSTEMI on 9/24.  We have limited data from the hospitall in [place].  D-dimer was negative.  MUGA shows mildly depressed LV function.  On exam, she is in NAD. Lungs:  Rales in bases, especially on right side. Cor:  RR Ext : no edema   I could not find any Troponin data.  We will cycle enzymes.  Medication changes as noted above.  DC to home tomorrow if she remains stable.   Vesta Mixer, Montez Hageman., MD, [facility] 10/02/2012, 8:01 PM

## 2012-10-02 NOTE — Telephone Encounter (Signed)
Follow Up     Pt returning a call from earlier regarding getting Pt back into [place].

## 2012-10-03 ENCOUNTER — Encounter (HOSPITAL_COMMUNITY): Payer: Self-pay

## 2012-10-03 DIAGNOSIS — I359 Nonrheumatic aortic valve disorder, unspecified: Secondary | ICD-10-CM

## 2012-10-03 LAB — BASIC METABOLIC PANEL
BUN: 19 mg/dL (ref 6–23)
Chloride: 106 mEq/L (ref 96–112)
GFR calc Af Amer: 85 mL/min — ABNORMAL LOW (ref >90)
Potassium: 4.4 mEq/L (ref 3.5–5.1)

## 2012-10-03 LAB — LIPID PANEL
Cholesterol: 127 mg/dL (ref 0–200)
HDL: 44 mg/dL (ref >39)
Total CHOL/HDL Ratio: 2.9 RATIO
Triglycerides: 70 mg/dL (ref <150)
VLDL: 14 mg/dL (ref 0–40)

## 2012-10-03 LAB — CBC
HCT: 34 % — ABNORMAL LOW (ref 36.0–46.0)
Hemoglobin: 11.8 g/dL — ABNORMAL LOW (ref 12.0–15.0)
MCH: 33.7 pg (ref 26.0–34.0)
RDW: 13.7 % (ref 11.5–15.5)
WBC: 7 10*3/uL (ref 4.0–10.5)

## 2012-10-03 LAB — T4, FREE: Free T4: 1.16 ng/dL (ref 0.80–1.80)

## 2012-10-03 LAB — TROPONIN I: Troponin I: <0.3 ng/mL (ref <0.30)

## 2012-10-03 LAB — HEMOGLOBIN A1C
Hgb A1c MFr Bld: 5.1 % (ref <5.7)
Mean Plasma Glucose: 100 mg/dL (ref <117)

## 2012-10-03 LAB — TSH: TSH: 4.335 u[IU]/mL (ref 0.350–4.500)

## 2012-10-03 MED ORDER — AMLODIPINE BESYLATE 2.5 MG PO TABS: 2.5000 mg | ORAL_TABLET | Freq: Every day | ORAL | Status: DC

## 2012-10-03 MED ORDER — NITROGLYCERIN 0.4 MG SL SUBL: 0.4000 mg | SUBLINGUAL_TABLET | SUBLINGUAL | Status: DC | PRN

## 2012-10-03 MED ORDER — FUROSEMIDE 20 MG PO TABS: 20.0000 mg | ORAL_TABLET | Freq: Every day | ORAL | Status: DC

## 2012-10-03 MED ORDER — CLOPIDOGREL BISULFATE 75 MG PO TABS: 75.0000 mg | ORAL_TABLET | Freq: Every day | ORAL | Status: DC

## 2012-10-03 MED ORDER — METOPROLOL TARTRATE 25 MG PO TABS: 25.0000 mg | ORAL_TABLET | Freq: Two times a day (BID) | ORAL | Status: DC

## 2012-10-03 MED ORDER — AMLODIPINE BESYLATE 5 MG PO TABS
5.0000 mg | ORAL_TABLET | Freq: Every day | ORAL | Status: DC
  Administered 2012-10-03: 5 mg via ORAL
  Filled 2012-10-03: qty 1

## 2012-10-03 NOTE — Progress Notes (Signed)
SUBJECTIVE:  She feels well and wants to go home. She denies chest pain or SOB.   PHYSICAL EXAM Filed Vitals:   10/02/12 1846 10/02/12 1900 10/02/12 1958 10/03/12 0435  BP: 173/71 180/63 169/71 137/54  Pulse: 68 66 77 70  Temp:   97.2 F (36.2 C) 98.8 F (37.1 C)  TempSrc:      Resp: 13 13 16 18   Height:   5\' 6"  (1.676 m)   Weight:   172 lb 4.8 oz (78.155 kg) 172 lb 4.8 oz (78.155 kg)  SpO2: 99% 98% 98% 94%   General:  No distress Lungs:  Bilateral basilar crackles Heart:  RRR Abdomen:  Positive bowel sounds, no rebound no guarding Extremities:  No edema, bruising  LABS:  Results for orders placed during the hospital encounter of 10/02/12 (from the past 24 hour(s))  CBC     Status: Abnormal   Collection Time    10/02/12  3:57 PM      Result Value Range   WBC 10.1  4.0 - 10.5 K/uL   RBC 3.88  3.87 - 5.11 MIL/uL   Hemoglobin 12.8  12.0 - 15.0 g/dL   HCT 82.9  56.2 - 13.0 %   MCV 97.4  78.0 - 100.0 fL   MCH 33.0  26.0 - 34.0 pg   MCHC 33.9  30.0 - 36.0 g/dL   RDW 86.5  78.4 - 69.6 %   Platelets 136 (*) 150 - 400 K/uL  BASIC METABOLIC PANEL     Status: Abnormal   Collection Time    10/02/12  3:57 PM      Result Value Range   Sodium 134 (*) 135 - 145 mEq/L   Potassium 4.3  3.5 - 5.1 mEq/L   Chloride 97  96 - 112 mEq/L   CO2 30  19 - 32 mEq/L   Glucose, Bld 105 (*) 70 - 99 mg/dL   BUN 24 (*) 6 - 23 mg/dL   Creatinine, Ser 2.95  0.50 - 1.10 mg/dL   Calcium 8.8  8.4 - 28.4 mg/dL   GFR calc non Af Amer 73 (*) >90 mL/min   GFR calc Af Amer 85 (*) >90 mL/min  PRO B NATRIURETIC PEPTIDE     Status: Abnormal   Collection Time    10/02/12  3:57 PM      Result Value Range   Pro B Natriuretic peptide (BNP) 1205.0 (*) 0 - 450 pg/mL  POCT I-STAT TROPONIN I     Status: None   Collection Time    10/02/12  4:19 PM      Result Value Range   Troponin i, poc 0.02  0.00 - 0.08 ng/mL   Comment 3           TROPONIN I     Status: None   Collection Time    10/02/12 11:55 PM      Result Value Range   Troponin I <0.30  <0.30 ng/mL  TROPONIN I     Status: None   Collection Time    10/03/12  5:45 AM      Result Value Range   Troponin I <0.30  <0.30 ng/mL  CBC     Status: Abnormal   Collection Time    10/03/12  5:45 AM      Result Value Range   WBC 7.0  4.0 - 10.5 K/uL   RBC 3.50 (*) 3.87 - 5.11 MIL/uL   Hemoglobin 11.8 (*) 12.0 - 15.0 g/dL  HCT 34.0 (*) 36.0 - 46.0 %   MCV 97.1  78.0 - 100.0 fL   MCH 33.7  26.0 - 34.0 pg   MCHC 34.7  30.0 - 36.0 g/dL   RDW 45.4  09.8 - 11.9 %   Platelets 122 (*) 150 - 400 K/uL  BASIC METABOLIC PANEL     Status: Abnormal   Collection Time    10/03/12  5:45 AM      Result Value Range   Sodium 142  135 - 145 mEq/L   Potassium 4.4  3.5 - 5.1 mEq/L   Chloride 106  96 - 112 mEq/L   CO2 27  19 - 32 mEq/L   Glucose, Bld 96  70 - 99 mg/dL   BUN 19  6 - 23 mg/dL   Creatinine, Ser 1.47  0.50 - 1.10 mg/dL   Calcium 8.2 (*) 8.4 - 10.5 mg/dL   GFR calc non Af Amer 74 (*) >90 mL/min   GFR calc Af Amer 85 (*) >90 mL/min  LIPID PANEL     Status: None   Collection Time    10/03/12  5:45 AM      Result Value Range   Cholesterol 127  0 - 200 mg/dL   Triglycerides 70  <829 mg/dL   HDL 44  >56 mg/dL   Total CHOL/HDL Ratio 2.9     VLDL 14  0 - 40 mg/dL   LDL Cholesterol 69  0 - 99 mg/dL   No intake or output data in the 24 hours ending 10/03/12 0856  ASSESSMENT AND PLAN:  Dyspnea:  Some crackles on exam.  She needs daily weights, low NA, Lasix daily with PRN dosing and a TOC appt in our office in 7 - 10 days.  I do not know whether the crackles I am hearing are baseline.   Mildly low EF on MUGA.  Home after the echo.    NSTEMI:  Enzymes negative.  Discussed with the patient and we are planning conservative management.    HTN:  I wonder if HTN contributed to the acute event.  Her BP has been elevated.  I would like to add Norvasc 2.5 mg.      Rollene Rotunda 10/03/2012 8:56 AM

## 2012-10-03 NOTE — Progress Notes (Signed)
Echocardiogram 2D Echocardiogram has been performed.  [name] 10/03/2012, 10:49 AM

## 2012-10-03 NOTE — Progress Notes (Signed)
Utilization review completed.  

## 2012-10-03 NOTE — Telephone Encounter (Signed)
Follow up     Per Danya PA  TCM 7 -10 days with Dawayne Patricia . appt is 10/10 @ 8:30

## 2012-10-03 NOTE — Discharge Summary (Signed)
Discharge Summary   Patient ID: [name] MRN: [id number], DOB/AGE: [id number] 77 y.o. Admit date: 10/02/2012 D/C date:     10/03/2012  Primary Cardiologist: New to [facility]  Primary Discharge Diagnoses:  1. Recent NSTEMI overseas - medical therapy implemented, conservative management for now 2. HTN 3. Pulmonary fibrosis  Secondary Discharge Diagnoses:  1. Breast CA - lumpectomy followed by RT and Tamoxifen x 5 years 2. Dermatitis 3. Female stress incontinence  4. Seborrheic keratosis, inflamed   5. Hearing loss 6. Erosive esophagitis 05/19/10  7. Hiatal hernia 05/19/10 5 cm  8. Anemia  9. Diverticulosis 05/19/10 moderate  10. Anxiety  11. 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 Tx with Xeloda.  12. Stricture esophagus 06/08/10 EGD  13. Gastritis  14. H/o PACs  Hospital Course: [name] is a functional 79 y/o F with history of CAD (nonobstructive on 2006 cath), pulmonary fibrosis, HTN, h/o breast CA, h/o colon CA, GERD, h/o gastritis and anxiety who presented to the ER last night for formal cardiac evaluation after possible NSTEMI while overseas. She was recently traveling by airplane from [place]. She went to the restroom and upon returning to her seat, she developed SOB. She was tended to by physician traveling on the plane. She was given supplemental O2 with improvement. The airplane was diverted to [place]. She was admitted at a hospital from Wednesday to yesterday. She flew into [place] and was taken directly to [facility] via EMS for evaluation.   Review of inpatient documents at outside hospital revealed  MUGA scan- EF 49%  "Mild troponin elevation" (no data available) No cath outlined  No evidence of PE (normal D-dimer)  Started on Plavix in addition to ASA  In the ED, EKG revealed NSR, old RBBB, TWIs III, aVF (unchanged from prior 2012 tracing). POC TnI WNL. Pro  BNP 1205. BMET- Na 134, BUN 24, Cr 0.74. CBC- PLT 136K, otherwise WNL. She was not hypoxic on RA. CXR w/o active disease. BP 183/82, otherwise VSS. She was admitted for observation and evaluation. Her diltiazem was changed to metoprolol. Troponins were cycled and remained negative here. Thyroid function was normal. 2D Echo showed EF 55%, mod LVH, mild AI/MR, prominent lipomatous infiltration of AV groove near tricuspid valve. Her BP has been elevated so Norvasc low dose was added. Dr. Antoine Poche wonders if HTN contributed to the acute event. He discussed workup with the patient and they plan conservative management for NSTEMI. ASA/Plavix will be continued. She was not yet started on statin this admission but would consider as outpatient. She feels well today. Dr. Antoine Poche has recommended continued Lasix daily (was on this PTA) with PRN dosing, daily weights, low sodium diet and a TOC appointment in 7-10 days. He has seen and examined the patient today and feels she is stable for discharge.  Discharge Vitals: Blood pressure 137/54, pulse 70, temperature 98.8 F (37.1 C), temperature source Oral, resp. rate 18, height 5\' 6"  (1.676 m), weight 172 lb 4.8 oz (78.155 kg), SpO2 94.00%.  Labs: Lab Results  Component Value Date   WBC 7.0 10/03/2012   HGB 11.8* 10/03/2012   HCT 34.0* 10/03/2012   MCV 97.1 10/03/2012   PLT 122* 10/03/2012     Recent Labs Lab 10/03/12 0545  NA 142  K 4.4  CL 106  CO2 27  BUN 19  CREATININE 0.73  CALCIUM 8.2*  GLUCOSE 96    Recent Labs  10/02/12 2355 10/03/12 0545  TROPONINI <0.30 <0.30   Lab Results  Component Value Date   CHOL 127 10/03/2012   HDL 44 10/03/2012   LDLCALC 69 10/03/2012   TRIG 70 10/03/2012    Diagnostic Studies/Procedures   2D Echo 10/03/12 - Left ventricle: The cavity size was normal. Wall thickness was increased in a pattern of moderate LVH. The estimated ejection fraction was 55%. - Aortic valve: Mild regurgitation. - Mitral valve: Mild  regurgitation. - Left atrium: The atrium was mildly dilated. - Right atrium: The atrium was mildly dilated. - Atrial septum: No defect or patent foramen ovale was identified. - Impressions: Appears to be prominant lipomatous infiltration of the AV groove near the tricuspid valve Impressions: - Appears to be prominant lipomatous infiltration of the AV groove near the tricuspid valve  Dg Chest 2 View 10/02/2012   CLINICAL DATA:  Chest pain and shortness of breath.  EXAM: CHEST - 2 VIEW  COMPARISON:  08/25/2011  FINDINGS: The heart size is at the upper limits of normal. The aorta is mildly ectatic. No edema, focal infiltrate, nodule or pleural fluid is identified. The bony thorax shows stable mild degenerative changes of the thoracic spine without evidence of lesion or fracture.  IMPRESSION: No active disease.   Electronically Signed   By: Irish Lack   On: 10/02/2012 16:35    Discharge Medications     Medication List    STOP taking these medications       diltiazem 60 MG tablet  Commonly known as:  CARDIZEM      TAKE these medications       acetaminophen 325 MG tablet  Commonly known as:  TYLENOL  Take 650 mg by mouth every 6 (six) hours as needed for pain.     amLODipine 2.5 MG tablet  Commonly known as:  NORVASC  Take 1 tablet (2.5 mg total) by mouth daily.  Start taking on:  10/04/2012     aspirin EC 81 MG tablet  Take 81 mg by mouth daily.     AZOPT 1 % ophthalmic suspension  Generic drug:  brinzolamide  Place 1 drop into both eyes 2 (two) times daily.     bimatoprost 0.01 % Soln  Commonly known as:  LUMIGAN  Place 1 drop into both eyes at bedtime.     clopidogrel 75 MG tablet  Commonly known as:  PLAVIX  Take 1 tablet (75 mg total) by mouth daily.     cycloSPORINE 0.05 % ophthalmic emulsion  Commonly known as:  RESTASIS  Place 1 drop into both eyes 2 (two) times daily.     fluocinonide cream 0.05 %  Commonly known as:  LIDEX  APPLY TOPICALLY 2 TIMES A  DAY     furosemide 20 MG tablet  Commonly known as:  LASIX  Take 1 tablet (20 mg total) by mouth daily. May take an extra 1 tablet daily as needed for weight gain of 2-3 pounds.     meclizine 32 MG tablet  Commonly known as:  ANTIVERT  Take 1 tablet (32 mg total) by mouth 3 (three) times daily as needed.     metoprolol tartrate 25 MG tablet  Commonly known as:  LOPRESSOR  Take 1 tablet (25 mg total) by mouth 2 (two) times daily.     multivitamin with minerals Tabs tablet  Take 1 tablet by mouth daily.     nitroGLYCERIN 0.4 MG SL tablet  Commonly known as:  NITROSTAT  Place 1  tablet (0.4 mg total) under the tongue every 5 (five) minutes as needed for chest pain (up to 3 doses).     potassium chloride 10 MEQ tablet  Commonly known as:  K-DUR,KLOR-CON  Take 10 mEq by mouth every Monday, Wednesday, and Friday.        Disposition   The patient will be discharged in stable condition to home. Discharge Orders   Future Appointments Provider Department Dept Phone   10/12/2012 8:30 AM Rosalio Macadamia, NP [facility] [phone]   Future Orders Complete By Expires   Diet - low sodium heart healthy  As directed    Discharge instructions  As directed    Comments:     Please note these special instructions:  1. Follow a low-salt diet and watch your fluid intake. In general, you should not be taking in more than 2 liters of fluid per day (no more than 8 glasses per day). Some patients are restricted to less than 1.5 liters of fluid per day (no more than 6 glasses per day). This includes sources of water in foods like soup, coffee, tea, milk, etc. 2. Weigh yourself on the same scale at same time of day and keep a log. 3. Call your doctor: (Anytime you feel any of the following symptoms)  - 3-4 pound weight gain in 1-2 days or 2 pounds overnight  - Shortness of breath, with or without a dry hacking cough  - Swelling in the hands, feet or stomach  - If you have to  sleep on extra pillows at night in order to breathe   IT IS IMPORTANT TO LET YOUR DOCTOR KNOW EARLY ON IF YOU ARE HAVING SYMPTOMS SO WE CAN HELP YOU!   Increase activity slowly  As directed      Follow-up Information   Follow up with Norma Fredrickson, NP. ([facility] - 10/12/12 at 8:30am)    Specialty:  Nurse Practitioner   Contact information:   [address] [place] [address] [phone]         Duration of Discharge Encounter: Greater than 30 minutes including physician and PA time.  Signed, Ronie Spies PA-C 10/03/2012, 12:25 PM  Patient seen and examined.  Plan as discussed in my rounding note for today and outlined above. Rollene Rotunda  10/03/2012  1:43 PM

## 2012-10-04 NOTE — Telephone Encounter (Signed)
Patient contacted regarding discharge from [facility] Patient understands to follow up with provider Norma Fredrickson NP on 10/10 at 830AM Patient understands discharge instructions Patient understands medications and regiment Patient understands to bring all medications to this visit  Spoke with patient's daughter concerning above. Not sure if the appointment time will work for the family. They will call back tomorrow when the brother is in town to try to change appointment time if necessary.

## 2012-10-05 ENCOUNTER — Telehealth: Payer: Self-pay | Admitting: Cardiology

## 2012-10-05 ENCOUNTER — Telehealth: Payer: Self-pay | Admitting: Cardiovascular Disease

## 2012-10-05 NOTE — Telephone Encounter (Signed)
Spoke with Dondra Spry and she feels patient was just anxious and she is doing much better and calmer. Dondra Spry has no way of checking blood pressure at home but will get a monitor to check. Will call back if anymore problems.

## 2012-10-05 NOTE — Telephone Encounter (Signed)
Agree 

## 2012-10-05 NOTE — Telephone Encounter (Signed)
Dr Antoine Poche aware - no further orders or changes at this time.

## 2012-10-05 NOTE — Telephone Encounter (Signed)
New problem:  Pt's daughter n law states the patient has gained 3 lbs since yesterday. Per hospital document/instructions it states for them to call their doctor. Family would like to be advised.

## 2012-10-05 NOTE — Telephone Encounter (Signed)
New Problem  Pt was given lasix// feeling dizzy// request a call back to confirm that this is normal.

## 2012-10-05 NOTE — Telephone Encounter (Signed)
Per Daughter-in-law - wt up 3 lbs - instructed her to give extra Furosemide per d/c instructions.  She states understanding.  Aware I will notify Dr Antoine Poche and call back with any other instructions or orders. Pt has follow up appt 10-10 with Norma Fredrickson, NP and daughter-in-law aware to call MD on call this weekend if changes.

## 2012-10-05 NOTE — Telephone Encounter (Signed)
noted 

## 2012-10-12 ENCOUNTER — Encounter: Payer: Self-pay | Admitting: Cardiology

## 2012-10-12 ENCOUNTER — Ambulatory Visit (INDEPENDENT_AMBULATORY_CARE_PROVIDER_SITE_OTHER): Payer: Medicare Other | Admitting: Nurse Practitioner

## 2012-10-12 ENCOUNTER — Encounter: Payer: Self-pay | Admitting: Nurse Practitioner

## 2012-10-12 VITALS — BP 160/80 | HR 60 | Ht 66.5 in | Wt 171.1 lb

## 2012-10-12 DIAGNOSIS — I214 Non-ST elevation (NSTEMI) myocardial infarction: Secondary | ICD-10-CM

## 2012-10-12 LAB — BASIC METABOLIC PANEL
BUN: 23 mg/dL (ref 6–23)
CO2: 30 mEq/L (ref 19–32)
Calcium: 8.9 mg/dL (ref 8.4–10.5)
Chloride: 99 mEq/L (ref 96–112)
Creatinine, Ser: 0.8 mg/dL (ref 0.4–1.2)
GFR: 72.69 mL/min (ref >60.00)
Glucose, Bld: 87 mg/dL (ref 70–99)
Potassium: 3.9 mEq/L (ref 3.5–5.1)
Sodium: 136 mEq/L (ref 135–145)

## 2012-10-12 LAB — CBC WITH DIFFERENTIAL/PLATELET
Basophils Absolute: 0 10*3/uL (ref 0.0–0.1)
Basophils Relative: 0.6 % (ref 0.0–3.0)
Eosinophils Absolute: 0.3 10*3/uL (ref 0.0–0.7)
Eosinophils Relative: 3.9 % (ref 0.0–5.0)
HCT: 35.7 % — ABNORMAL LOW (ref 36.0–46.0)
Hemoglobin: 12.2 g/dL (ref 12.0–15.0)
Lymphocytes Relative: 22.1 % (ref 12.0–46.0)
Lymphs Abs: 1.7 10*3/uL (ref 0.7–4.0)
MCHC: 34.3 g/dL (ref 30.0–36.0)
MCV: 97.5 fl (ref 78.0–100.0)
Monocytes Absolute: 0.8 10*3/uL (ref 0.1–1.0)
Monocytes Relative: 10.7 % (ref 3.0–12.0)
Neutro Abs: 4.8 10*3/uL (ref 1.4–7.7)
Neutrophils Relative %: 62.7 % (ref 43.0–77.0)
Platelets: 169 10*3/uL (ref 150.0–400.0)
RBC: 3.66 Mil/uL — ABNORMAL LOW (ref 3.87–5.11)
RDW: 13.6 % (ref 11.5–14.6)
WBC: 7.7 10*3/uL (ref 4.5–10.5)

## 2012-10-12 NOTE — Progress Notes (Signed)
[name] Date of Birth: 23-Jan-1920 Medical Record #[id number]  History of Present Illness: [name] is seen back today for a post hospital visit. Seen for Dr. Antoine Mills. Has multiple issues which include breast cancer, dermatitis, esophagitis back in 2012, hiatal hernia,  anemia, anxiety, colon cancer in 2012, and PACs. She has CAD - nonobstructive per cath back in 2006, pulmonary fibrosis and HTN.   Most recently with NSTEMI overseas - medical therapy implemented with conservative management recommended. Had recently traveled to [place] - On the plan she got SOB and given oxygen, flight diverted to [place] - then brought here directly to [place]. Review of inpatient documents showed a MUGA with an EF of 49%, mild troponin elevation (no specific data), normal d dimer and started on Plavix in addition to ASA.   In the hospital here, her troponins were negative. Echo showed EF of 55%, mod LVH, mild AI/MR, prominent lipomatous infiltration of AV groove near tricuspid valve. BP was up - Norvasc was added. Dr. Antoine Mills wondered if HTN contributed to the acute event. Need to consider statin therpay. Diltiazem was changed to metoprolol.   Comes back today. Here with her daughter and granddaughter. Says it is "too damn early to be here". Just took her medicines. BP still up. Not checking at home. No chest pain. Not short of breath. Daughter notes that she will "pant" at times. Has had good days and bad days - bad days described as just weak and fatigued. No bleeding. Some bruising. Wants to go back to driving - apparently this was an issue prior - family says she is quite forgetful - she is very hard of hearing.    Current Outpatient Prescriptions  Medication Sig Dispense Refill  . acetaminophen (TYLENOL) 325 MG tablet Take 650 mg by mouth every 6 (six) hours as needed for pain.      Marland Kitchen amLODipine (NORVASC) 2.5 MG tablet Take 1 tablet (2.5 mg total) by mouth daily.  30 tablet  3  . aspirin EC  81 MG tablet Take 81 mg by mouth daily.      . AZOPT 1 % ophthalmic suspension Place 1 drop into both eyes 2 (two) times daily.       . bimatoprost (LUMIGAN) 0.01 % SOLN Place 1 drop into both eyes at bedtime.      . clopidogrel (PLAVIX) 75 MG tablet Take 1 tablet (75 mg total) by mouth daily.  30 tablet  3  . cycloSPORINE (RESTASIS) 0.05 % ophthalmic emulsion Place 1 drop into both eyes 2 (two) times daily.       . fluocinonide cream (LIDEX) 0.05 % APPLY TOPICALLY 2 TIMES A DAY  60 g  3  . furosemide (LASIX) 20 MG tablet Take 1 tablet (20 mg total) by mouth daily. May take an extra 1 tablet daily as needed for weight gain of 2-3 pounds.      . metoprolol tartrate (LOPRESSOR) 25 MG tablet Take 1 tablet (25 mg total) by mouth 2 (two) times daily.  60 tablet  3  . Multiple Vitamin (MULTIVITAMIN WITH MINERALS) TABS tablet Take 1 tablet by mouth daily.      . nitroGLYCERIN (NITROSTAT) 0.4 MG SL tablet Place 1 tablet (0.4 mg total) under the tongue every 5 (five) minutes as needed for chest pain (up to 3 doses).  25 tablet  3  . potassium chloride (K-DUR,KLOR-CON) 10 MEQ tablet Take 10 mEq by mouth every Monday, Wednesday, and Friday.      Marland Kitchen  meclizine (ANTIVERT) 32 MG tablet Take 1 tablet (32 mg total) by mouth 3 (three) times daily as needed.  30 tablet  0   No current facility-administered medications for this visit.    Allergies  Allergen Reactions  . Codeine Nausea And Vomiting    REACTION: Stomach cramps  . Indomethacin Other (See Comments)    REACTION: TIA type episodes  . Penicillins Nausea And Vomiting    REACTION: vomit  . Sulfonamide Derivatives Nausea And Vomiting    REACTION: Nausea Vomiting    Past Medical History  Diagnosis Date  . Breast cancer     lumpectomy followed by RT and Tamoxifen x 5 years  . Hypertension   . CAD (coronary artery disease)     a. Mod nonobstructive by cath 2006. b. stress test 2007- no evidence of ischemia; EF 58%  . Dermatitis   . Female stress  incontinence   . Seborrheic keratosis, inflamed   . Pulmonary fibrosis   . Hearing loss   . Erosive esophagitis 05/19/10  . Hiatal hernia 05/19/10    5 cm  . Anemia   . Diverticulosis 05/19/10    moderate  . Anxiety   . Colon cancer     a. 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 Tx with Xeloda.  . Stricture esophagus 06/08/10    EGD  . Gastritis   . NSTEMI (non-ST elevated myocardial infarction)     [place].  . Premature atrial contractions   . Pulmonary fibrosis     Past Surgical History  Procedure Laterality Date  . Breast lumpectomy    . Shoulder arthroscopy w/ acromial repair      bilateral  . Appendectomy    . Colon resection  05/20/2010    [facility]  . Total abdominal hysterectomy w/ bilateral salpingoophorectomy    . Cardiac catheterization  2006    40% ostial LAD, 60% mid LAD followed by 30-40% disease, 50% just after D2, 60-70% D2 lesion followed by 50% disease, normal LCx/RCA; EF 55%    History  Smoking status  . Former Smoker  . Types: Cigarettes  . Quit date: 01/04/1983  Smokeless tobacco  . Never Used    Comment: Quit 25-30 years ago    History  Alcohol Use  . 2.0 oz/week  . 4 drink(s) per week    Family History  Problem Relation Age of Onset  . Heart disease Mother   . Colon polyps Daughter   . Colon cancer Brother     in 62s  . Breast cancer Daughter   . Kidney failure Father     Review of Systems: The review of systems is per the HPI.  All other systems were reviewed and are negative.  Physical Exam: BP 160/80  Pulse 60  Ht 5' 6.5" (1.689 m)  Wt 171 lb 1.9 oz (77.62 kg)  BMI 27.21 kg/m2 BP by me is 150/90.  Patient is very pleasant and in no acute distress. She is quite hard of hearing. Skin is warm and dry. Color is normal.  HEENT is unremarkable. Normocephalic/atraumatic. PERRL. Sclera are nonicteric. Neck is supple. No masses. No  JVD. Lungs show fine crackles.  Cardiac exam shows a regular rate and rhythm. Abdomen is soft. Extremities are without edema. Gait and ROM are intact. No gross neurologic deficits noted.  LABORATORY DATA: PENDING  Lab Results  Component Value Date   WBC 7.0 10/03/2012  HGB 11.8* 10/03/2012   HCT 34.0* 10/03/2012   PLT 122* 10/03/2012   GLUCOSE 96 10/03/2012   CHOL 127 10/03/2012   TRIG 70 10/03/2012   HDL 44 10/03/2012   LDLCALC 69 10/03/2012   ALT 10 08/03/2010   AST 18 08/03/2010   NA 142 10/03/2012   K 4.4 10/03/2012   CL 106 10/03/2012   CREATININE 0.73 10/03/2012   BUN 19 10/03/2012   CO2 27 10/03/2012   TSH 4.335 10/02/2012   HGBA1C 5.1 10/02/2012   Echo Study Conclusions  - Left ventricle: The cavity size was normal. Wall thickness was increased in a pattern of moderate LVH. The estimated ejection fraction was 55%. - Aortic valve: Mild regurgitation. - Mitral valve: Mild regurgitation. - Left atrium: The atrium was mildly dilated. - Right atrium: The atrium was mildly dilated. - Atrial septum: No defect or patent foramen ovale was identified. - Pulmonary arteries: PA peak pressure: 40mm Hg (S). - Impressions: Appears to be prominant lipomatous infiltration of the AV groove near the tricuspid valve  CORONARY ANATOMY FROM 2006:  1. LEFT MAIN: A very short vessel, with nearly separate ostia for the LAD  and left circumflex though both filled with a single injection.  2. LEFT ANTERIOR DESCENDING ARTERY: A large vessel, wrapping the apex. It  gave off a very large first diagonal and a small second diagonal branch.  In the ostial LAD there was a 40% focal stenosis. This was followed by  a long portion of calcification. Just after the takeoff of the large  diagonal, there was a 60% stenosis. Following this, there was a tubular  long 30-40% stenosis spanning down to the second diagonal. After the  second diagonal there was 50% focal stenosis. In a large first diagonal  there was a  60-70% ostial lesion, followed by a long tubular 50% lesion.  3. LEFT CIRCUMFLEX: A large vessel that gave off tiny OM1 and a large  branching OM2. The AV groove circumflex was small. There was no  angiographic coronary disease.  4. RIGHT CORONARY ARTERY: A large, dominant vessel that gave off a large  PDA, a small PL1 and a large PL2. There was no significant coronary  disease.  LEFT VENTRICULOGRAPHY: Done in the RAO approach, showed an EF of 55% with  no significant wall motion abnormalities. There was no significant mitral  regurgitation.  ABDOMINAL AORTOGRAM: Showed an irregularity in the left renal artery, but  no significant stenosis. There was mild plaque in the distal abdominal  aorta.  ASSESSMENT: Borderline coronary artery disease in the left anterior  descending artery and first diagonal branch. This does not appear to be  flow-limiting, and doubtful that this is actually causing her symptoms.  Otherwise, coronaries look quite good with a normal LV function.  DISCUSSION: I will review the cineangiographic films with Drs. Samule Ohm and  [employer]. I think she will likely do quite well with medical therapy though  given her degree of symptoms, could consider adenosine Cardiolite to rule  out ischemia in the anterior wall. Would also consider workup of possible  other etiologies for her chest pain and dyspnea; such as possible CT scan of  the chest to rule out pulmonary embolus.  ADDENDUM:  I have reviewed the catheterization films with Dr. Samule Ohm, one of our  interventionalist, and he agrees that the lesions in the LAD and the  diagonal do not appear to be flow limiting and would recommend medical  therapy.  DB/MEDQ D: 06/30/2004 T:  06/30/2004 Job: [id number]  cc: Tinnie Gens A. Tawanna Cooler, M.D. [facility]  Doylene Canning. Ladona Ridgel, M.D.        Assessment / Plan:  1. Recent NSTEMI - with known CAD per remote cath from 2006 - to be managed medically/conservatively - she seems to be doing ok - probably does  not need to be back driving due to her medical issues - I have asked them to touch base with Dr. Tawanna Cooler as well but probably not a good idea any more. No change in medicines for now.  2. HTN - just took her medicines - BP still up - will monitor her BP at home and bring in for me to review. For now no change in her medicines.   3. HLD - lipids ok - would hold on statin for now.   4. Advanced age - with multiple comorbidities - favor conservative management.   Recheck labs today. See her back in a month. See Dr. Antoine Mills in 3 months.   Patient is agreeable to this plan and will call if any problems develop in the interim.   Rosalio Macadamia, RN, ANP-C [facility] [address] [place], [place]  [address]

## 2012-10-12 NOTE — Patient Instructions (Addendum)
Stay on your current medicines  Monitor your blood pressure at home - keep a record - bring to your next visit with me with your BP cuff  I will see you back in a month  We are checking labs today.  See Dr. Antoine Poche in 3 months.  Call the [facility] office at [phone] if you have any questions, problems or concerns.

## 2012-10-22 ENCOUNTER — Encounter: Payer: Self-pay | Admitting: Family Medicine

## 2012-10-22 ENCOUNTER — Ambulatory Visit (INDEPENDENT_AMBULATORY_CARE_PROVIDER_SITE_OTHER): Payer: Medicare Other | Admitting: Family Medicine

## 2012-10-22 VITALS — BP 160/80 | Temp 97.7°F | Wt 175.0 lb

## 2012-10-22 DIAGNOSIS — I10 Essential (primary) hypertension: Secondary | ICD-10-CM

## 2012-10-22 DIAGNOSIS — I2510 Atherosclerotic heart disease of native coronary artery without angina pectoris: Secondary | ICD-10-CM

## 2012-10-22 MED ORDER — AMLODIPINE BESYLATE 5 MG PO TABS: 5.0000 mg | ORAL_TABLET | Freq: Every day | ORAL | Status: DC

## 2012-10-22 NOTE — Patient Instructions (Addendum)
Continue all your medicines except increase the Norvasc to 5 mg daily  Check your blood pressure daily in the morning  You'll be seeing Lynnae Sandhoff on November 10. Take a record of all your blood pressure readings with you and the device to that appointment

## 2012-10-22 NOTE — Progress Notes (Signed)
  Subjective:    Patient ID: [name], female    DOB: [birth year], 79 y.o.   MRN: [id number]  HPI [name] is a delightful 79 year old female nonsmoker who comes in today accompanied by her daughter. This past September she went to [place]. On the way back she began having chest pain shortness of breath. They landed the plane in [place] and she was admitted to a very small hospital there. After week they felt like she had mild congestive heart failure and an MI. She was transferred to count when she came back to [place]. Catheterization shows diffuse coronary disease felt to be medically treated no surgery. Echo showed a 55% ejection fraction because of her elevated blood pressure 183/82 low dose Norvasc was added. Also Lasix and a potassium supplement. Her blood pressure days 160/80. Would like to see her diastolic in the 140 range.  The daughter says they have somebody coming twice a week but that can be increased. The daughter also does not feel like she should drive anymore and I concur with that not only because of her underlying coronary disease but because of her profound hearing loss  Review of Systems    review of systems otherwise negative Objective:   Physical Exam  Well-developed and nourished female no acute distress weight 175 BP 160/80 pulse is 60 and regular cardiac exam negative      Assessment & Plan:  Hypertension not at goal increase Norvasc to 5 mg daily continue Lasix and potassium recheck labs today  Status post mild MI currently asymptomatic

## 2012-10-25 ENCOUNTER — Institutional Professional Consult (permissible substitution): Payer: Medicare Other | Admitting: Cardiology

## 2012-11-12 ENCOUNTER — Ambulatory Visit (INDEPENDENT_AMBULATORY_CARE_PROVIDER_SITE_OTHER): Payer: Medicare Other | Admitting: Nurse Practitioner

## 2012-11-12 ENCOUNTER — Encounter: Payer: Self-pay | Admitting: Nurse Practitioner

## 2012-11-12 VITALS — BP 120/70 | HR 72 | Ht 64.0 in | Wt 173.4 lb

## 2012-11-12 DIAGNOSIS — I214 Non-ST elevation (NSTEMI) myocardial infarction: Secondary | ICD-10-CM

## 2012-11-12 NOTE — Patient Instructions (Addendum)
Stay on your current medicines - take the metoprolol twice a day  See Dr. Antoine Poche in January - will try to make that appointment today  Call the [facility] office at [phone] if you have any questions, problems or concerns.

## 2012-11-12 NOTE — Progress Notes (Signed)
[name] Date of Birth: 31-Aug-1920 Medical Record #[id number]  History of Present Illness: [name] is seen back today for a one month check. Seen for Dr. Antoine Poche. She has multiple issues which include breast cancer, dermatitis, esophagitis back in 2012, hiatal hernia, anemia, anxiety, colon cancer in 2012, PACs, nonobstructive CAD per remote cath in 2006, pulmonary fibrosis and HTN.  [place] - medical therapy implemented with conservative approach. MUGA showed EF of 49%, mild troponin elevation, normal d dimer and was started on Plavix in addition to her aspirin. Once back the country, was taken to [facility] - Echo showed EF of 55%, moderate LVH, mild AI/MR, prominent lipomatous infiltration of AV groove. BP was up - Norvsac was added and Dr. Antoine Poche wondered if HTN contributed this to the acute event.   Seen a month ago - seemed to be doing ok. Little weak and fatigue. Family reported significant forgetfulness. Overall, seemed to be ok. Advised to monitor her BP at home.   Has been seen by Dr. Tawanna Cooler in the interim - he has advised her to stop driving. Norvasc was increased due to elevated BP.   Comes back today. Here with her daughter. Doing ok. She did increase the Norvasc but thought she was told to take the metoprolol only once a day. Her cuff correlates fairly well. She is doing well. No chest pain. Not short of breath. Feels like she is getting stronger. BP readings 140 to 160 at home. Some swelling in her legs - does not like to wear support stockings. Daughter thinks she is doing well. Turns 89 next week. Her entire family is going to the beach for thanksgiving.   Current Outpatient Prescriptions  Medication Sig Dispense Refill  . acetaminophen (TYLENOL) 325 MG tablet Take 650 mg by mouth every 6 (six) hours as needed for pain.      Marland Kitchen amLODipine (NORVASC) 5 MG tablet Take 1 tablet (5 mg total) by mouth daily.  90 tablet  3  . aspirin EC 81  MG tablet Take 81 mg by mouth daily.      . AZOPT 1 % ophthalmic suspension Place 1 drop into both eyes 2 (two) times daily.       . bimatoprost (LUMIGAN) 0.01 % SOLN Place 1 drop into both eyes at bedtime.      . clopidogrel (PLAVIX) 75 MG tablet Take 1 tablet (75 mg total) by mouth daily.  30 tablet  3  . cycloSPORINE (RESTASIS) 0.05 % ophthalmic emulsion Place 1 drop into both eyes 2 (two) times daily.       . fluocinonide cream (LIDEX) 0.05 % APPLY TOPICALLY 2 TIMES A DAY  60 g  3  . furosemide (LASIX) 20 MG tablet Take 1 tablet (20 mg total) by mouth daily. May take an extra 1 tablet daily as needed for weight gain of 2-3 pounds.      . meclizine (ANTIVERT) 32 MG tablet Take 1 tablet (32 mg total) by mouth 3 (three) times daily as needed.  30 tablet  0  . metoprolol tartrate (LOPRESSOR) 25 MG tablet Take 1 tablet (25 mg total) by mouth 2 (two) times daily.  60 tablet  3  . Multiple Vitamin (MULTIVITAMIN WITH MINERALS) TABS tablet Take 1 tablet by mouth daily.      . nitroGLYCERIN (NITROSTAT) 0.4 MG SL tablet Place 1 tablet (0.4 mg total) under the tongue every 5 (five) minutes as needed for chest pain (up  to 3 doses).  25 tablet  3  . potassium chloride (K-DUR,KLOR-CON) 10 MEQ tablet Take 10 mEq by mouth every Monday, Wednesday, and Friday.       No current facility-administered medications for this visit.    Allergies  Allergen Reactions  . Codeine Nausea And Vomiting    REACTION: Stomach cramps  . Indomethacin Other (See Comments)    REACTION: TIA type episodes  . Penicillins Nausea And Vomiting    REACTION: vomit  . Sulfonamide Derivatives Nausea And Vomiting    REACTION: Nausea Vomiting    Past Medical History  Diagnosis Date  . Breast cancer     lumpectomy followed by RT and Tamoxifen x 5 years  . Hypertension   . CAD (coronary artery disease)     a. Mod nonobstructive by cath 2006. b. stress test 2007- no evidence of ischemia; EF 58%  . Dermatitis   . Female stress  incontinence   . Seborrheic keratosis, inflamed   . Pulmonary fibrosis   . Hearing loss   . Erosive esophagitis 05/19/10  . Hiatal hernia 05/19/10    5 cm  . Anemia   . Diverticulosis 05/19/10    moderate  . Anxiety   . Colon cancer     a. 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 Tx with Xeloda.  . Stricture esophagus 06/08/10    EGD  . Gastritis   . NSTEMI (non-ST elevated myocardial infarction)     [place]  . Premature atrial contractions   . Pulmonary fibrosis     Past Surgical History  Procedure Laterality Date  . Breast lumpectomy    . Shoulder arthroscopy w/ acromial repair      bilateral  . Appendectomy    . Colon resection  05/20/2010    [facility]  . Total abdominal hysterectomy w/ bilateral salpingoophorectomy    . Cardiac catheterization  2006    40% ostial LAD, 60% mid LAD followed by 30-40% disease, 50% just after D2, 60-70% D2 lesion followed by 50% disease, normal LCx/RCA; EF 55%    History  Smoking status  . Former Smoker  . Types: Cigarettes  . Quit date: 01/04/1983  Smokeless tobacco  . Never Used    Comment: Quit 25-30 years ago    History  Alcohol Use  . 2.0 oz/week  . 4 drink(s) per week    Family History  Problem Relation Age of Onset  . Heart disease Mother   . Colon polyps Daughter   . Colon cancer Brother     in 22s  . Breast cancer Daughter   . Kidney failure Father     Review of Systems: The review of systems is per the HPI.  All other systems were reviewed and are negative.  Physical Exam: BP 120/70  Pulse 72  Ht 5\' 4"  (1.626 m)  Wt 173 lb 6.4 oz (78.654 kg)  BMI 29.75 kg/m2  SpO2 96% Patient is very pleasant and in no acute distress. She is quite hard of hearing. Skin is warm and dry. Color is normal.  HEENT is unremarkable. Normocephalic/atraumatic. PERRL. Sclera are nonicteric. Neck is supple. No masses. No JVD. Lungs  are clear. Cardiac exam shows a regular rate and rhythm. Abdomen is soft. Extremities are without edema. Gait and ROM are intact. No gross neurologic deficits noted.  LABORATORY DATA: Lab Results  Component Value Date   WBC 7.7 10/12/2012  HGB 12.2 10/12/2012   HCT 35.7* 10/12/2012   PLT 169.0 10/12/2012   GLUCOSE 87 10/12/2012   CHOL 127 10/03/2012   TRIG 70 10/03/2012   HDL 44 10/03/2012   LDLCALC 69 10/03/2012   ALT 10 08/03/2010   AST 18 08/03/2010   NA 136 10/12/2012   K 3.9 10/12/2012   CL 99 10/12/2012   CREATININE 0.8 10/12/2012   BUN 23 10/12/2012   CO2 30 10/12/2012   TSH 4.335 10/02/2012   HGBA1C 5.1 10/02/2012   Echo Study Conclusions  - Left ventricle: The cavity size was normal. Wall thickness was increased in a pattern of moderate LVH. The estimated ejection fraction was 55%. - Aortic valve: Mild regurgitation. - Mitral valve: Mild regurgitation. - Left atrium: The atrium was mildly dilated. - Right atrium: The atrium was mildly dilated. - Atrial septum: No defect or patent foramen ovale was identified. - Pulmonary arteries: PA peak pressure: 40mm Hg (S). - Impressions: Appears to be prominant lipomatous infiltration of the AV groove near the tricuspid valve   CORONARY ANATOMY FROM 2006:  1. LEFT MAIN: A very short vessel, with nearly separate ostia for the LAD  and left circumflex though both filled with a single injection.  2. LEFT ANTERIOR DESCENDING ARTERY: A large vessel, wrapping the apex. It  gave off a very large first diagonal and a small second diagonal branch.  In the ostial LAD there was a 40% focal stenosis. This was followed by  a long portion of calcification. Just after the takeoff of the large  diagonal, there was a 60% stenosis. Following this, there was a tubular  long 30-40% stenosis spanning down to the second diagonal. After the  second diagonal there was 50% focal stenosis. In a large first diagonal  there was a 60-70% ostial lesion,  followed by a long tubular 50% lesion.  3. LEFT CIRCUMFLEX: A large vessel that gave off tiny OM1 and a large  branching OM2. The AV groove circumflex was small. There was no  angiographic coronary disease.  4. RIGHT CORONARY ARTERY: A large, dominant vessel that gave off a large  PDA, a small PL1 and a large PL2. There was no significant coronary  disease.  LEFT VENTRICULOGRAPHY: Done in the RAO approach, showed an EF of 55% with  no significant wall motion abnormalities. There was no significant mitral  regurgitation.  ABDOMINAL AORTOGRAM: Showed an irregularity in the left renal artery, but  no significant stenosis. There was mild plaque in the distal abdominal  aorta.  ASSESSMENT: Borderline coronary artery disease in the left anterior  descending artery and first diagonal branch. This does not appear to be  flow-limiting, and doubtful that this is actually causing her symptoms.  Otherwise, coronaries look quite good with a normal LV function.  DISCUSSION: I will review the cineangiographic films with Drs. Samule Ohm and  [employer]. I think she will likely do quite well with medical therapy though  given her degree of symptoms, could consider adenosine Cardiolite to rule  out ischemia in the anterior wall. Would also consider workup of possible  other etiologies for her chest pain and dyspnea; such as possible CT scan of  the chest to rule out pulmonary embolus.  ADDENDUM:  I have reviewed the catheterization films with Dr. Samule Ohm, one of our  interventionalist, and he agrees that the lesions in the LAD and the  diagonal do not appear to be flow limiting and would recommend medical  therapy.  DB/MEDQ D: 06/30/2004  T: 06/30/2004 Job: [id number]  cc: Tinnie Gens A. Tawanna Cooler, M.D. [facility]  Doylene Canning. Ladona Ridgel, M.D.      Assessment / Plan:   1. Recent NSTEMI - with known CAD per remote cath from 2006 - to be managed medically/conservatively - she is doing very well.   2. HTN - Norvasc recently increased -  will have her take her Metoprolol BID - see her back in January.   3. HLD - lipids look great - would hold on statin for now.   4. Advanced age - with multiple comorbidities - favor conservative management.   See Dr. Antoine Poche back in January. She looks good overall. Continue with a conservative approach. Early "Happy Birthday".  Patient is agreeable to this plan and will call if any problems develop in the interim.   Rosalio Macadamia, RN, ANP-C  [facility]  [address]  [place], [place] [address]    Assessment / Plan:

## 2012-11-21 ENCOUNTER — Encounter: Payer: Self-pay | Admitting: Podiatry

## 2012-11-21 ENCOUNTER — Ambulatory Visit: Payer: Self-pay | Admitting: Podiatry

## 2012-11-21 ENCOUNTER — Other Ambulatory Visit: Payer: Self-pay | Admitting: Family Medicine

## 2012-11-21 ENCOUNTER — Ambulatory Visit (INDEPENDENT_AMBULATORY_CARE_PROVIDER_SITE_OTHER): Payer: Medicare Other | Admitting: Podiatry

## 2012-11-21 VITALS — BP 140/79 | HR 69 | Resp 16 | Ht 66.0 in | Wt 170.0 lb

## 2012-11-21 DIAGNOSIS — Q828 Other specified congenital malformations of skin: Secondary | ICD-10-CM

## 2012-11-21 DIAGNOSIS — M2010 Hallux valgus (acquired), unspecified foot: Secondary | ICD-10-CM

## 2012-11-21 NOTE — Progress Notes (Signed)
  Subjective:    Patient ID: [name], female    DOB: August 12, 1920, 79 y.o.   MRN: [id number] "This thing on my right foot bothers me.  I'm getting ready to go on vacation so I wanted to get it checked out." HPI Comments: N  Very tender, throbs, wakes me up at night L  Bunion Rt painful D  1 week to 10 days O  Slowly,  C  Gotten worse A  Compression hose may have T  None       Review of Systems  HENT: Positive for hearing loss.   All other systems reviewed and are negative.       Objective:   Physical Exam Subjective: Orientated x63 79 year old white female presents with care giver.  Vascular: DP is two over four bilaterally. PTs one over four bilaterally.  Neurological: Knee and ankle reflexes equal and reactive bilaterally.  Dermatological: Nucleated keratoses overlying right first MPJ. Dry, atrophic, hyperpigmentated skin noted right and left feet.  Musculoskeletal: Bilateral HAV bunion is noted. Patient has a painful gait somewhat unsteady.      Assessment & Plan:   Assessment: HAV bunion right Porokeratoses overlying HAV bunion right Gait disturbance  Plan: The keratoses on the right first MPJ was debrided back without any bleeding, packed with salinocaine. Patient advised to keep this area dry for 2 days. Wear soft shoe over the bunion. Return as needed.

## 2012-11-21 NOTE — Patient Instructions (Signed)
Remove bandage on right foot in 2 days. Keep area dry. Wear soft shoe or cut out shoe over the right great toe joint. Return as needed for remaining of the painful corn overlying the bunion on the right foot.

## 2012-12-17 ENCOUNTER — Telehealth: Payer: Self-pay | Admitting: Cardiology

## 2012-12-17 NOTE — Telephone Encounter (Signed)
Spoke with patient who would like permission from  Dr. Antoine Poche to fly to [facility] on 12/21.  Patient states her son will be flying with her.  I advised patient that I believe it will be okay and discussed the importance of moving around and doing ROM exercises during the flight but advised that Dr. Antoine Poche is not in the office and that I will route message to him to verify that he is in agreement.  Patient verbalized understanding.

## 2012-12-17 NOTE — Telephone Encounter (Signed)
New problem    Pt want's to know whether or not she can fly?   Please give her a call back.

## 2012-12-18 NOTE — Telephone Encounter (Signed)
Ok to fly.

## 2012-12-18 NOTE — Telephone Encounter (Signed)
Called patient and informed her that Dr. Antoine Poche said it is okay to fly.  Patient verbalized gratitude.

## 2012-12-26 ENCOUNTER — Encounter: Payer: Medicare Other | Admitting: Family Medicine

## 2013-01-07 ENCOUNTER — Ambulatory Visit: Payer: Medicare Other | Admitting: Cardiology

## 2013-01-15 ENCOUNTER — Telehealth: Payer: Self-pay | Admitting: Family Medicine

## 2013-01-15 MED ORDER — BENZONATATE 200 MG PO CAPS: 200.0000 mg | ORAL_CAPSULE | Freq: Three times a day (TID) | ORAL | Status: DC | PRN

## 2013-01-15 NOTE — Telephone Encounter (Signed)
Spoke with patient.  rx sent to pharmacy per Dr Sherren Mocha

## 2013-01-15 NOTE — Telephone Encounter (Signed)
Pt has a bad cold.  Pt cannot drive. Would like dr todd to call her in a med. [facility]  [address] will deliver. Pt feels miserable and won't have a driver until thurs.  No fever. Head stopped up. pls advise

## 2013-01-21 ENCOUNTER — Encounter: Payer: Medicare Other | Admitting: Family Medicine

## 2013-01-21 ENCOUNTER — Other Ambulatory Visit (INDEPENDENT_AMBULATORY_CARE_PROVIDER_SITE_OTHER): Payer: Medicare Other

## 2013-01-21 DIAGNOSIS — I10 Essential (primary) hypertension: Secondary | ICD-10-CM

## 2013-01-21 DIAGNOSIS — E785 Hyperlipidemia, unspecified: Secondary | ICD-10-CM

## 2013-01-21 LAB — TSH: TSH: 3.82 u[IU]/mL (ref 0.35–5.50)

## 2013-01-21 LAB — BASIC METABOLIC PANEL
BUN: 19 mg/dL (ref 6–23)
CO2: 29 meq/L (ref 19–32)
Calcium: 8.8 mg/dL (ref 8.4–10.5)
Chloride: 97 mEq/L (ref 96–112)
Creatinine, Ser: 0.8 mg/dL (ref 0.4–1.2)
GFR: 67.67 mL/min (ref >60.00)
Glucose, Bld: 97 mg/dL (ref 70–99)
Potassium: 4.6 mEq/L (ref 3.5–5.1)
SODIUM: 133 meq/L — AB (ref 135–145)

## 2013-01-21 LAB — CBC WITH DIFFERENTIAL/PLATELET
Basophils Absolute: 0 10*3/uL (ref 0.0–0.1)
Basophils Relative: 0.6 % (ref 0.0–3.0)
EOS ABS: 0.3 10*3/uL (ref 0.0–0.7)
Eosinophils Relative: 4.3 % (ref 0.0–5.0)
HCT: 39.4 % (ref 36.0–46.0)
HEMOGLOBIN: 13.1 g/dL (ref 12.0–15.0)
Lymphocytes Relative: 20.5 % (ref 12.0–46.0)
Lymphs Abs: 1.5 10*3/uL (ref 0.7–4.0)
MCHC: 33.2 g/dL (ref 30.0–36.0)
MCV: 98.2 fl (ref 78.0–100.0)
MONO ABS: 1 10*3/uL (ref 0.1–1.0)
Monocytes Relative: 12.9 % — ABNORMAL HIGH (ref 3.0–12.0)
NEUTROS ABS: 4.6 10*3/uL (ref 1.4–7.7)
Neutrophils Relative %: 61.7 % (ref 43.0–77.0)
Platelets: 205 10*3/uL (ref 150.0–400.0)
RBC: 4.01 Mil/uL (ref 3.87–5.11)
RDW: 14.1 % (ref 11.5–14.6)
WBC: 7.5 10*3/uL (ref 4.5–10.5)

## 2013-01-21 LAB — LIPID PANEL
CHOL/HDL RATIO: 3
CHOLESTEROL: 137 mg/dL (ref 0–200)
HDL: 42.2 mg/dL (ref >39.00)
LDL Cholesterol: 69 mg/dL (ref 0–99)
TRIGLYCERIDES: 128 mg/dL (ref 0.0–149.0)
VLDL: 25.6 mg/dL (ref 0.0–40.0)

## 2013-01-21 LAB — HEPATIC FUNCTION PANEL
ALT: 15 U/L (ref 0–35)
AST: 22 U/L (ref 0–37)
Albumin: 3.9 g/dL (ref 3.5–5.2)
Alkaline Phosphatase: 51 U/L (ref 39–117)
Bilirubin, Direct: 0 mg/dL (ref 0.0–0.3)
TOTAL PROTEIN: 7.1 g/dL (ref 6.0–8.3)
Total Bilirubin: 0.7 mg/dL (ref 0.3–1.2)

## 2013-01-31 ENCOUNTER — Encounter: Payer: Medicare Other | Admitting: Family Medicine

## 2013-02-04 ENCOUNTER — Encounter: Payer: Self-pay | Admitting: Family Medicine

## 2013-02-04 ENCOUNTER — Ambulatory Visit (INDEPENDENT_AMBULATORY_CARE_PROVIDER_SITE_OTHER): Payer: Medicare Other | Admitting: Family Medicine

## 2013-02-04 VITALS — BP 120/80 | Ht 64.0 in | Wt 177.0 lb

## 2013-02-04 DIAGNOSIS — H9190 Unspecified hearing loss, unspecified ear: Secondary | ICD-10-CM

## 2013-02-04 DIAGNOSIS — F3289 Other specified depressive episodes: Secondary | ICD-10-CM | POA: Insufficient documentation

## 2013-02-04 DIAGNOSIS — I10 Essential (primary) hypertension: Secondary | ICD-10-CM

## 2013-02-04 DIAGNOSIS — R413 Other amnesia: Secondary | ICD-10-CM

## 2013-02-04 DIAGNOSIS — J8410 Pulmonary fibrosis, unspecified: Secondary | ICD-10-CM

## 2013-02-04 DIAGNOSIS — N393 Stress incontinence (female) (male): Secondary | ICD-10-CM

## 2013-02-04 DIAGNOSIS — Z853 Personal history of malignant neoplasm of breast: Secondary | ICD-10-CM

## 2013-02-04 DIAGNOSIS — Z23 Encounter for immunization: Secondary | ICD-10-CM

## 2013-02-04 DIAGNOSIS — F329 Major depressive disorder, single episode, unspecified: Secondary | ICD-10-CM

## 2013-02-04 MED ORDER — DONEPEZIL HCL 5 MG PO TABS: 5.0000 mg | ORAL_TABLET | Freq: Every day | ORAL | Status: DC

## 2013-02-04 MED ORDER — POTASSIUM CHLORIDE CRYS ER 10 MEQ PO TBCR: 10.0000 meq | EXTENDED_RELEASE_TABLET | ORAL | Status: DC

## 2013-02-04 MED ORDER — METOPROLOL TARTRATE 25 MG PO TABS: ORAL_TABLET | ORAL | Status: DC

## 2013-02-04 MED ORDER — FUROSEMIDE 20 MG PO TABS: 20.0000 mg | ORAL_TABLET | Freq: Every day | ORAL | Status: DC

## 2013-02-04 MED ORDER — NITROGLYCERIN 0.4 MG SL SUBL: 0.4000 mg | SUBLINGUAL_TABLET | SUBLINGUAL | Status: AC | PRN

## 2013-02-04 MED ORDER — AMLODIPINE BESYLATE 5 MG PO TABS: 5.0000 mg | ORAL_TABLET | Freq: Every day | ORAL | Status: DC

## 2013-02-04 MED ORDER — CITALOPRAM HYDROBROMIDE 10 MG PO TABS: 10.0000 mg | ORAL_TABLET | Freq: Every day | ORAL | Status: DC

## 2013-02-04 MED ORDER — FLUOCINONIDE 0.05 % EX CREA: TOPICAL_CREAM | CUTANEOUS | Status: DC

## 2013-02-04 MED ORDER — CLOPIDOGREL BISULFATE 75 MG PO TABS: 75.0000 mg | ORAL_TABLET | Freq: Every day | ORAL | Status: DC

## 2013-02-04 NOTE — Progress Notes (Signed)
   Subjective:    Patient ID: [name], female    DOB: [birth year], 80 y.o.   MRN: [id number]  HPI Kiyonna is a 80 year old widowed female nonsmoker who comes in today for a Medicare wellness examination because of a history of hypertension, coronary disease, and 2 new problems of depression and short-term memory loss  Her daughter as noted her short-term memory has been declining over the last 6-12 months. Long-term memory intact  She's also noted depression symptoms of tearfulness feeling hopeless and helpless at all started when she turned 89  Cognitive function normal home health safety reviewed no issues identified, no guns in the house, she does have a health care power of attorney and living well  She continues to live alone and has a helper 3 days per week come in to the store etc. Also regular sees her on a regular basis  Vaccinations up-to-date   Review of Systems  Constitutional: Negative.   HENT: Negative.   Eyes: Negative.   Respiratory: Negative.   Cardiovascular: Negative.   Gastrointestinal: Negative.   Genitourinary: Negative.   Musculoskeletal: Negative.   Neurological: Negative.   Psychiatric/Behavioral: Negative.        Objective:   Physical Exam  Nursing note and vitals reviewed. Constitutional: She is oriented to person, place, and time. She appears well-developed and well-nourished.  HENT:  Head: Normocephalic and atraumatic.  Right Ear: External ear normal.  Left Ear: External ear normal.  Nose: Nose normal.  Mouth/Throat: Oropharynx is clear and moist.  ....Marland KitchenMarland KitchenHearing loss profound......... cerumen impaction right ear removed by suction and irrigation  Eyes: EOM are normal. Pupils are equal, round, and reactive to light.  Neck: Normal range of motion. Neck supple. No thyromegaly present.  Cardiovascular: Normal rate, regular rhythm, normal heart sounds and intact distal pulses.  Exam reveals no gallop and no friction rub.   No murmur  heard. Pulmonary/Chest: Effort normal and breath sounds normal.  Abdominal: Soft. Bowel sounds are normal. She exhibits no distension and no mass. There is no tenderness. There is no rebound.  Musculoskeletal: Normal range of motion. She exhibits no edema and no tenderness.  Lymphadenopathy:    She has no cervical adenopathy.  Neurological: She is alert and oriented to person, place, and time. She has normal reflexes. She displays normal reflexes. No cranial nerve deficit. She exhibits normal muscle tone. Coordination normal.  Short-term memory loss  Skin: Skin is warm and dry.  Psychiatric: She has a normal mood and affect. Her behavior is normal. Judgment and thought content normal.          Assessment & Plan:  Hypertension ........... BP too low for age 12/80 decrease the Norvasc to 2.5 mg daily  Hearing loss  Depression and Celexa  Short-term memory add Aricept followup in 4 weeks

## 2013-02-04 NOTE — Progress Notes (Signed)
Pre visit review using our clinic review tool, if applicable. No additional management support is needed unless otherwise documented below in the visit note. 

## 2013-02-04 NOTE — Patient Instructions (Signed)
Use the 4 point walker all the time now  Strongly consider a transition to assisted living ASAP  Celexa 10 mg........ one in the morning for depression   Aricept,,,,,,,,, one in the morning for memory preservation  Metaproterenol 25 mg .Marland Kitchen... one daily in the morning  Norvasc 5 mg ....Marland KitchenMarland KitchenMarland Kitchen 1 daily in the morning  Plavix 75 mg.......Marland Kitchen 1 daily in the morning  Steroid cream when necessary  Lasix 20 mg.........Marland Kitchen 1 daily in the morning  Potassium 20 mEq.......Marland Kitchen 1 Monday Wednesday Friday  Return in 4 weeks for followup

## 2013-02-05 ENCOUNTER — Telehealth: Payer: Self-pay | Admitting: Family Medicine

## 2013-02-05 NOTE — Telephone Encounter (Signed)
Relevant patient education mailed to patient.  

## 2013-02-08 ENCOUNTER — Encounter: Payer: Self-pay | Admitting: Cardiology

## 2013-02-08 ENCOUNTER — Ambulatory Visit (INDEPENDENT_AMBULATORY_CARE_PROVIDER_SITE_OTHER): Payer: Medicare Other | Admitting: Cardiology

## 2013-02-08 VITALS — BP 156/80 | HR 71 | Ht 64.0 in | Wt 176.0 lb

## 2013-02-08 DIAGNOSIS — I10 Essential (primary) hypertension: Secondary | ICD-10-CM

## 2013-02-08 DIAGNOSIS — I2510 Atherosclerotic heart disease of native coronary artery without angina pectoris: Secondary | ICD-10-CM

## 2013-02-08 NOTE — Patient Instructions (Addendum)
Your physician recommends that you continue on your current medications as directed. Please refer to the Current Medication list given to you today.  Follow up as needed  

## 2013-02-08 NOTE — Progress Notes (Signed)
HPI  Since I last saw her she has had no acute cardiovascular complaints. Unfortunately she's been having more depression. She saw Dr. Sherren Mocha yesterday and was started on Celexa. She also had her beta blocker dose reduced. Her blood pressure was lower yesterday. Her daughter seems to think she's having a little more trouble just with daily activities and perhaps with memory. She's chronically short of breath taking deep breaths but there is no description of PND or orthopnea. She's having any acute shortness of breath that she had when she was traveling last year at the time of her non-STEMI. She denies any chest pressure, neck or arm discomfort. She's not having any new palpitations, presyncope or syncope. She's had no weight gain or edema.   Allergies  Allergen Reactions  . Codeine Nausea And Vomiting    REACTION: Stomach cramps  . Indomethacin Other (See Comments)    REACTION: TIA type episodes  . Penicillins Nausea And Vomiting    REACTION: vomit  . Sulfonamide Derivatives Nausea And Vomiting    REACTION: Nausea Vomiting    Current Outpatient Prescriptions  Medication Sig Dispense Refill  . acetaminophen (TYLENOL) 325 MG tablet Take 650 mg by mouth every 6 (six) hours as needed for pain.      Marland Kitchen amLODipine (NORVASC) 5 MG tablet Take 1 tablet (5 mg total) by mouth daily.  90 tablet  3  . aspirin EC 81 MG tablet Take 81 mg by mouth daily.      . AZOPT 1 % ophthalmic suspension Place 1 drop into both eyes 2 (two) times daily.       . bimatoprost (LUMIGAN) 0.01 % SOLN Place 1 drop into both eyes at bedtime.      . citalopram (CELEXA) 10 MG tablet Take 1 tablet (10 mg total) by mouth daily.  100 tablet  3  . clopidogrel (PLAVIX) 75 MG tablet Take 1 tablet (75 mg total) by mouth daily.  100 tablet  3  . cycloSPORINE (RESTASIS) 0.05 % ophthalmic emulsion Place 1 drop into both eyes 2 (two) times daily.       Marland Kitchen donepezil (ARICEPT) 5 MG tablet Take 1 tablet (5 mg total) by mouth at bedtime.   100 tablet  3  . fluocinonide cream (LIDEX) 0.05 % APPLY TOPICALLY 2 TIMES A DAY  60 g  3  . furosemide (LASIX) 20 MG tablet Take 1 tablet (20 mg total) by mouth daily. May take an extra 1 tablet daily as needed for weight gain of 2-3 pounds.  100 tablet  3  . metoprolol tartrate (LOPRESSOR) 25 MG tablet One tablet daily in the morning  100 tablet  3  . Multiple Vitamin (MULTIVITAMIN WITH MINERALS) TABS tablet Take 1 tablet by mouth daily.      . nitroGLYCERIN (NITROSTAT) 0.4 MG SL tablet Place 1 tablet (0.4 mg total) under the tongue every 5 (five) minutes as needed for chest pain (up to 3 doses).  25 tablet  3  . potassium chloride (K-DUR,KLOR-CON) 10 MEQ tablet Take 1 tablet (10 mEq total) by mouth every Monday, Wednesday, and Friday.  100 tablet  3   No current facility-administered medications for this visit.    Past Medical History  Diagnosis Date  . Breast cancer     lumpectomy followed by RT and Tamoxifen x 5 years  . Hypertension   . CAD (coronary artery disease)     a. Mod nonobstructive by cath 2006. b. stress test 2007- no evidence  of ischemia; EF 58%  . Dermatitis   . Female stress incontinence   . Seborrheic keratosis, inflamed   . Pulmonary fibrosis   . Hearing loss   . Erosive esophagitis 05/19/10  . Hiatal hernia 05/19/10    5 cm  . Anemia   . Diverticulosis 05/19/10    moderate  . Anxiety   . Colon cancer     a. 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 Tx with Xeloda.  . Stricture esophagus 06/08/10    EGD  . Gastritis   . NSTEMI (non-ST elevated myocardial infarction)     [place]  . Premature atrial contractions   . Pulmonary fibrosis     Past Surgical History  Procedure Laterality Date  . Breast lumpectomy    . Shoulder arthroscopy w/ acromial repair      bilateral  . Appendectomy    . Colon resection  05/20/2010    [facility]  . Total abdominal  hysterectomy w/ bilateral salpingoophorectomy    . Cardiac catheterization  2006    40% ostial LAD, 60% mid LAD followed by 30-40% disease, 50% just after D2, 60-70% D2 lesion followed by 50% disease, normal LCx/RCA; EF 55%    ROS:  As stated in the HPI and negative for all other systems.  PHYSICAL EXAM BP 156/80  Pulse 71  Ht 5\' 4"  (1.626 m)  Wt 176 lb (79.833 kg)  BMI 30.20 kg/m2 GENERAL:  Frail but in no distress NECK:  No jugular venous distention, waveform within normal limits, carotid upstroke brisk and symmetric, no bruits, no thyromegaly LUNGS:  Clear to auscultation bilaterally BACK:  No CVA tenderness, lordosis HEART:  PMI not displaced or sustained,S1 and S2 within normal limits, no S3, no S4, no clicks, no rubs, no murmurs ABD:  Flat, positive bowel sounds normal in frequency in pitch, no bruits, no rebound, no guarding, no midline pulsatile mass, no hepatomegaly, no splenomegaly EXT:  2 plus pulses throughout, no edema, no cyanosis no clubbing SKIN:  Psoriasis  EKG:  Sinus rhythm, rate 71, right bundle branch block, no acute ST-T wave changes.  No change from previous. 02/08/2013  ASSESSMENT AND PLAN  NSTEMI:  She has had no symptoms consistent with her previous event. At this point no change in therapy is indicated. She will continue with conservative management.  HTN:  Her blood pressure is a little high today but it was low at Dr. Honor Junes office and he reduced his beta blocker. I will defer to his management.  CAD:  This has been mild as and we are managing this conservatively. No change in therapy is indicated. I will defer further management to Dr. Sherren Mocha

## 2013-02-15 ENCOUNTER — Other Ambulatory Visit: Payer: Self-pay | Admitting: Family Medicine

## 2013-03-04 ENCOUNTER — Encounter: Payer: Self-pay | Admitting: Family Medicine

## 2013-03-04 ENCOUNTER — Ambulatory Visit (INDEPENDENT_AMBULATORY_CARE_PROVIDER_SITE_OTHER): Payer: Medicare Other | Admitting: Family Medicine

## 2013-03-04 VITALS — BP 130/70 | Temp 97.6°F | Wt 176.0 lb

## 2013-03-04 DIAGNOSIS — Z111 Encounter for screening for respiratory tuberculosis: Secondary | ICD-10-CM

## 2013-03-04 DIAGNOSIS — F3289 Other specified depressive episodes: Secondary | ICD-10-CM

## 2013-03-04 DIAGNOSIS — R413 Other amnesia: Secondary | ICD-10-CM

## 2013-03-04 DIAGNOSIS — N393 Stress incontinence (female) (male): Secondary | ICD-10-CM

## 2013-03-04 DIAGNOSIS — Z0000 Encounter for general adult medical examination without abnormal findings: Secondary | ICD-10-CM

## 2013-03-04 DIAGNOSIS — F329 Major depressive disorder, single episode, unspecified: Secondary | ICD-10-CM

## 2013-03-04 DIAGNOSIS — Z201 Contact with and (suspected) exposure to tuberculosis: Secondary | ICD-10-CM

## 2013-03-04 DIAGNOSIS — I10 Essential (primary) hypertension: Secondary | ICD-10-CM

## 2013-03-04 NOTE — Progress Notes (Signed)
Pre visit review using our clinic review tool, if applicable. No additional management support is needed unless otherwise documented below in the visit note. 

## 2013-03-05 NOTE — Progress Notes (Signed)
   Subjective:    Patient ID: [name], female    DOB: 1920-10-22, 80 y.o.   MRN: [id number]  HPI [name] is a 80 year old female who comes in today accompanied by her daughter for evaluation of multiple issues  We are in transition process from being independent living in her apartment to a long-term care facility.  She has some memory issues seem to be stable however she didn't take her Aricept daily.  She has a history of depression this seems to be getting worse with the stress of moving. We started her on low-dose Celexa 10 mg daily her daughter feels like she is much improved.   Review of Systems    review of systems negative Objective:   Physical Exam  Well-developed well-nourished female no acute distress vital signs stable she is afebrile      Assessment & Plan:  Depression secondary to change in social situation..... Continue Celexa 10 mg daily  Dementia,,,,, take medication daily  Urinary incontinence,,,,,,, no medication at this time  Hearing loss unchanged  Paperwork filled out for long term care facility,

## 2013-03-05 NOTE — Patient Instructions (Signed)
Return when necessary 

## 2013-03-11 ENCOUNTER — Telehealth: Payer: Self-pay | Admitting: Family Medicine

## 2013-03-11 NOTE — Telephone Encounter (Signed)
Changes made and copy faxed

## 2013-03-11 NOTE — Telephone Encounter (Signed)
Ok to write on copy. Would you rahter haer original? She will drop off if yiou need. But ok to write on copies

## 2013-03-21 ENCOUNTER — Telehealth: Payer: Self-pay | Admitting: Cardiology

## 2013-03-21 NOTE — Telephone Encounter (Signed)
New Message:  Pt's daughter states her mom went to [facility]... States the doctor gave her celebrex RX... However, they want her to check with Dr. Percival Spanish to see if that medication is ok.Marland KitchenMarland Kitchen

## 2013-03-21 NOTE — Telephone Encounter (Signed)
Will forward to Dr Percival Spanish to review

## 2013-03-24 NOTE — Telephone Encounter (Signed)
She should try it and see if it helps.  If so, I would accept the small risk of taking it.

## 2013-03-25 NOTE — Telephone Encounter (Signed)
Ann aware of Dr [employer] recommendations

## 2013-04-10 ENCOUNTER — Other Ambulatory Visit: Payer: Self-pay | Admitting: Family Medicine

## 2013-04-16 ENCOUNTER — Other Ambulatory Visit: Payer: Self-pay | Admitting: Family Medicine

## 2013-04-22 ENCOUNTER — Encounter: Payer: Self-pay | Admitting: Family Medicine

## 2013-04-22 ENCOUNTER — Ambulatory Visit (INDEPENDENT_AMBULATORY_CARE_PROVIDER_SITE_OTHER): Payer: Medicare Other | Admitting: Family Medicine

## 2013-04-22 ENCOUNTER — Telehealth: Payer: Self-pay | Admitting: Family Medicine

## 2013-04-22 VITALS — BP 120/80 | Temp 98.1°F | Wt 179.0 lb

## 2013-04-22 DIAGNOSIS — R413 Other amnesia: Secondary | ICD-10-CM

## 2013-04-22 DIAGNOSIS — I10 Essential (primary) hypertension: Secondary | ICD-10-CM

## 2013-04-22 NOTE — Telephone Encounter (Signed)
Celebrex 200 and pharmacy is aware.

## 2013-04-22 NOTE — Progress Notes (Signed)
   Subjective:    Patient ID: PETRONA WYETH, female    DOB: Jul 25, 1920, 80 y.o.   MRN: [id number]  HPI Mercadez is a 80 year old female who comes in today accompanied by her daughter for evaluation of multiple issues  She's not a [facility] and has noticed the increased peripheral edema. She takes Lasix 20 mg daily. Questions salt in the diet  She's also had a lot of trauma bruising. She takes one aspirin tablet daily  For the past month she said to loose bowel movements a day they're explosive interstitial not able to make it to the bathroom. She's not having true diarrhea. This is most likely a side effect from her Aricept. She's had any fever vomiting or weight loss. No rectal bleeding   Review of Systems Review of systems otherwise negative    Objective:   Physical Exam  Well-developed well-nourished female no acute distress vital signs stable she is afebrile examination skin shows multiple bruises on the arms. She has trace to 1+ peripheral edema however she's wearing calf high stockings      Assessment & Plan:  Loose bowel movements probably secondary to air except,,,,,,,,, hold the Aricept for one month restart it one Monday Wednesday Friday  Peripheral edema,,,,,,, salt free diet extra dose of Lasix at noon when necessary  Bruising stop the aspirin,

## 2013-04-22 NOTE — Progress Notes (Signed)
Pre visit review using our clinic review tool, if applicable. No additional management support is needed unless otherwise documented below in the visit note. 

## 2013-04-22 NOTE — Telephone Encounter (Signed)
Collin from [facility] called to verify which strength of celebrex should the pt be on 100 or 200 can be reached at [phone]

## 2013-04-22 NOTE — Patient Instructions (Addendum)
You need to be on a complete salt free diet  Lasix 20 mg........Marland Kitchen 1 daily in the morning and an extra dose it and in when necessary  Stop the aspirin  Stop the Aricept for one month then restart at 1 Monday Wednesday and Friday  Cotton golf socks,,,,,,, stopped the Nylons

## 2013-07-22 ENCOUNTER — Other Ambulatory Visit: Payer: Self-pay

## 2013-07-22 DIAGNOSIS — Z1231 Encounter for screening mammogram for malignant neoplasm of breast: Secondary | ICD-10-CM

## 2013-07-30 ENCOUNTER — Ambulatory Visit
Admission: RE | Admit: 2013-07-30 | Discharge: 2013-07-30 | Disposition: A | Discharge status: Home / Self Care | Payer: Medicare Other | Source: Ambulatory Visit

## 2013-07-30 DIAGNOSIS — Z1231 Encounter for screening mammogram for malignant neoplasm of breast: Secondary | ICD-10-CM

## 2013-09-18 ENCOUNTER — Ambulatory Visit (INDEPENDENT_AMBULATORY_CARE_PROVIDER_SITE_OTHER): Payer: Medicare Other | Admitting: Physician Assistant

## 2013-09-18 ENCOUNTER — Encounter: Payer: Self-pay | Admitting: Physician Assistant

## 2013-09-18 VITALS — BP 120/80 | HR 72 | Temp 98.8°F | Resp 18 | Wt 179.0 lb

## 2013-09-18 DIAGNOSIS — B9789 Other viral agents as the cause of diseases classified elsewhere: Principal | ICD-10-CM

## 2013-09-18 DIAGNOSIS — J069 Acute upper respiratory infection, unspecified: Secondary | ICD-10-CM

## 2013-09-18 MED ORDER — GUAIFENESIN-DM 100-10 MG/5ML PO SYRP: 5.0000 mL | ORAL_SOLUTION | ORAL | Status: DC | PRN

## 2013-09-18 NOTE — Progress Notes (Signed)
Pre visit review using our clinic review tool, if applicable. No additional management support is needed unless otherwise documented below in the visit note. 

## 2013-09-18 NOTE — Progress Notes (Signed)
Subjective:    Patient ID: [name], female    DOB: April 22, 1920, 80 y.o.   MRN: [id number]  Cough This is a new problem. The current episode started yesterday. The problem has been gradually worsening. The problem occurs every few minutes. The cough is non-productive. Associated symptoms include nasal congestion and rhinorrhea. Pertinent negatives include no chest pain, chills, ear congestion, ear pain, fever, headaches, heartburn, hemoptysis, myalgias, postnasal drip, rash, sore throat, shortness of breath, sweats, weight loss or wheezing. Exacerbated by: talking. Risk factors: was with a friend on Wednesday, who happens to have the same illness. She has tried nothing for the symptoms. There is no history of asthma, COPD or environmental allergies.      Review of Systems  Constitutional: Negative for fever, chills and weight loss.  HENT: Positive for rhinorrhea. Negative for ear pain, postnasal drip and sore throat.   Respiratory: Positive for cough. Negative for hemoptysis, shortness of breath and wheezing.   Cardiovascular: Negative for chest pain.  Gastrointestinal: Negative for heartburn, nausea, vomiting and diarrhea.  Musculoskeletal: Negative for myalgias.  Skin: Negative for rash.  Allergic/Immunologic: Negative for environmental allergies.  Neurological: Negative for syncope and headaches.  All other systems reviewed and are negative.    Past Medical History  Diagnosis Date  . Breast cancer     lumpectomy followed by RT and Tamoxifen x 5 years  . Hypertension   . CAD (coronary artery disease)     a. Mod nonobstructive by cath 2006. b. stress test 2007- no evidence of ischemia; EF 58%  . Dermatitis   . Female stress incontinence   . Seborrheic keratosis, inflamed   . Pulmonary fibrosis   . Hearing loss   . Erosive esophagitis 05/19/10  . Hiatal hernia 05/19/10    5 cm  . Anemia   . Diverticulosis 05/19/10    moderate  . Anxiety   . Colon cancer     a. 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 Tx with Xeloda.  . Stricture esophagus 06/08/10    EGD  . Gastritis   . NSTEMI (non-ST elevated myocardial infarction)     [place]  . Premature atrial contractions   . Pulmonary fibrosis     History   Social History  . Marital Status: Single    Spouse Name: N/A    Number of Children: N/A  . Years of Education: N/A   Occupational History  . retired    Social History Main Topics  . Smoking status: Former Smoker    Types: Cigarettes    Quit date: 01/04/1983  . Smokeless tobacco: Never Used     Comment: Quit 25-30 years ago  . Alcohol Use: 2.0 oz/week    4 drink(s) per week     Comment: a drink a night, a little scotch helps you  . Drug Use: No  . Sexual Activity: No   Other Topics Concern  . Not on file   Social History Narrative  . No narrative on file    Past Surgical History  Procedure Laterality Date  . Breast lumpectomy    . Shoulder arthroscopy w/ acromial repair      bilateral  . Appendectomy    . Colon resection  05/20/2010    [facility]  . Total abdominal hysterectomy w/ bilateral salpingoophorectomy    . Cardiac catheterization  2006    40% ostial LAD, 60%  mid LAD followed by 30-40% disease, 50% just after D2, 60-70% D2 lesion followed by 50% disease, normal LCx/RCA; EF 55%    Family History  Problem Relation Age of Onset  . Heart disease Mother   . Colon polyps Daughter   . Colon cancer Brother     in 30s  . Breast cancer Daughter   . Kidney failure Father     Allergies  Allergen Reactions  . Codeine Nausea And Vomiting    REACTION: Stomach cramps  . Indomethacin Other (See Comments)    REACTION: TIA type episodes  . Penicillins Nausea And Vomiting    REACTION: vomit  . Sulfonamide Derivatives Nausea And Vomiting    REACTION: Nausea Vomiting    Current Outpatient Prescriptions on File Prior to Visit    Medication Sig Dispense Refill  . acetaminophen (TYLENOL) 325 MG tablet Take 650 mg by mouth every 6 (six) hours as needed for pain.      Marland Kitchen amLODipine (NORVASC) 5 MG tablet Take 1 tablet (5 mg total) by mouth daily.  90 tablet  3  . ASPIRIN LOW DOSE 81 MG EC tablet TAKE ONE TABLET BY MOUTH ONCE DAILY.  90 tablet  3  . AZOPT 1 % ophthalmic suspension Place 1 drop into both eyes 2 (two) times daily.       . bimatoprost (LUMIGAN) 0.01 % SOLN Place 1 drop into both eyes at bedtime.      . celecoxib (CELEBREX) 200 MG capsule TAKE (1) CAPSULE BY MOUTH ONCE DAILY.  90 capsule  3  . citalopram (CELEXA) 10 MG tablet Take 1 tablet (10 mg total) by mouth daily.  100 tablet  3  . clopidogrel (PLAVIX) 75 MG tablet Take 1 tablet (75 mg total) by mouth daily.  100 tablet  3  . cycloSPORINE (RESTASIS) 0.05 % ophthalmic emulsion Place 1 drop into both eyes 2 (two) times daily.       Marland Kitchen donepezil (ARICEPT) 5 MG tablet Take 1 tablet (5 mg total) by mouth at bedtime.  100 tablet  3  . fluocinonide cream (LIDEX) 0.05 % APPLY TOPICALLY 2 TIMES A DAY  60 g  3  . furosemide (LASIX) 20 MG tablet take 1 tablet by mouth once daily  90 tablet  3  . metoprolol tartrate (LOPRESSOR) 25 MG tablet One tablet daily in the morning  100 tablet  3  . Multiple Vitamin (MULTIVITAMIN WITH MINERALS) TABS tablet Take 1 tablet by mouth daily.      . nitroGLYCERIN (NITROSTAT) 0.4 MG SL tablet Place 1 tablet (0.4 mg total) under the tongue every 5 (five) minutes as needed for chest pain (up to 3 doses).  25 tablet  3  . potassium chloride (K-DUR,KLOR-CON) 10 MEQ tablet Take 1 tablet (10 mEq total) by mouth every Monday, Wednesday, and Friday.  100 tablet  3   No current facility-administered medications on file prior to visit.    EXAM: BP 120/80  Pulse 72  Temp(Src) 98.8 F (37.1 C) (Oral)  Resp 18  Wt 179 lb (81.194 kg)  SpO2 96%     Objective:   Physical Exam  Nursing note and vitals reviewed. Constitutional: She is  oriented to person, place, and time. She appears well-developed and well-nourished. No distress.  HENT:  Head: Normocephalic and atraumatic.  Right Ear: External ear normal.  Left Ear: External ear normal.  Nose: Nose normal.  Mouth/Throat: Oropharynx is clear and moist. No oropharyngeal exudate.  Bilateral TMs normal. Bilateral frontal  and maxillary sinuses non-TTP.  Eyes: Conjunctivae and EOM are normal. Pupils are equal, round, and reactive to light.  Neck: Normal range of motion. Neck supple.  Cardiovascular: Normal rate, regular rhythm and intact distal pulses.   Pulmonary/Chest: Effort normal and breath sounds normal. No stridor. No respiratory distress. She has no wheezes. She has no rales. She exhibits no tenderness.  Lymphadenopathy:    She has no cervical adenopathy.  Neurological: She is alert and oriented to person, place, and time.  Skin: Skin is warm and dry. She is not diaphoretic. No pallor.  Psychiatric: She has a normal mood and affect. Her behavior is normal. Judgment and thought content normal.     Lab Results  Component Value Date   WBC 7.5 01/21/2013   HGB 13.1 01/21/2013   HCT 39.4 01/21/2013   PLT 205.0 01/21/2013   GLUCOSE 97 01/21/2013   CHOL 137 01/21/2013   TRIG 128.0 01/21/2013   HDL 42.20 01/21/2013   LDLCALC 69 01/21/2013   ALT 15 01/21/2013   AST 22 01/21/2013   NA 133* 01/21/2013   K 4.6 01/21/2013   CL 97 01/21/2013   CREATININE 0.8 01/21/2013   BUN 19 01/21/2013   CO2 29 01/21/2013   TSH 3.82 01/21/2013   HGBA1C 5.1 10/02/2012        Assessment & Plan:  Chanah was seen today for cough.  Diagnoses and associated orders for this visit:  Viral URI with cough Comments: Symptomatic treatment with Robitussin, push fluid, watchful waiting.  Return precautions provided, and patient handout on URI.  Plan to follow up as needed, or for worsening or persistent symptoms despite treatment.  Patient Instructions  Robitussin over-the-counter to help with  thick secretions.  Push fluid hydration with water.  If emergency symptoms discussed during visit developed, seek medical attention immediately.  Followup as needed, or for worsening or persistent symptoms despite treatment.

## 2013-09-18 NOTE — Patient Instructions (Addendum)
Robitussin over-the-counter to help with thick secretions.  Push fluid hydration with water.  If emergency symptoms discussed during visit developed, seek medical attention immediately.  Followup as needed, or for worsening or persistent symptoms despite treatment.    Upper Respiratory Infection, Adult An upper respiratory infection (URI) is also known as the common cold. It is often caused by a type of germ (virus). Colds are easily spread (contagious). You can pass it to others by kissing, coughing, sneezing, or drinking out of the same glass. Usually, you get better in 1 or 2 weeks.  HOME CARE   Only take medicine as told by your doctor.  Use a warm mist humidifier or breathe in steam from a hot shower.  Drink enough water and fluids to keep your pee (urine) clear or pale yellow.  Get plenty of rest.  Return to work when your temperature is back to normal or as told by your doctor. You may use a face mask and wash your hands to stop your cold from spreading. GET HELP RIGHT AWAY IF:   After the first few days, you feel you are getting worse.  You have questions about your medicine.  You have chills, shortness of breath, or brown or red spit (mucus).  You have yellow or brown snot (nasal discharge) or pain in the face, especially when you bend forward.  You have a fever, puffy (swollen) neck, pain when you swallow, or white spots in the back of your throat.  You have a bad headache, ear pain, sinus pain, or chest pain.  You have a high-pitched whistling sound when you breathe in and out (wheezing).  You have a lasting cough or cough up blood.  You have sore muscles or a stiff neck. MAKE SURE YOU:   Understand these instructions.  Will watch your condition.  Will get help right away if you are not doing well or get worse. Document Released: 06/08/2007 Document Revised: 03/14/2011 Document Reviewed: 03/27/2013 [facility] Patient Information 2015 [place], [place]. This  information is not intended to replace advice given to you by your health care provider. Make sure you discuss any questions you have with your health care provider.

## 2013-09-30 ENCOUNTER — Encounter: Payer: Self-pay | Admitting: Family Medicine

## 2013-09-30 ENCOUNTER — Other Ambulatory Visit: Payer: Self-pay | Admitting: Family Medicine

## 2013-09-30 ENCOUNTER — Ambulatory Visit (INDEPENDENT_AMBULATORY_CARE_PROVIDER_SITE_OTHER): Payer: Medicare Other | Admitting: Family Medicine

## 2013-09-30 VITALS — BP 108/68 | HR 71 | Temp 98.2°F | Wt 179.0 lb

## 2013-09-30 DIAGNOSIS — J189 Pneumonia, unspecified organism: Secondary | ICD-10-CM | POA: Insufficient documentation

## 2013-09-30 DIAGNOSIS — J181 Lobar pneumonia, unspecified organism: Principal | ICD-10-CM

## 2013-09-30 DIAGNOSIS — I10 Essential (primary) hypertension: Secondary | ICD-10-CM

## 2013-09-30 MED ORDER — CLARITHROMYCIN 250 MG PO TABS: 250.0000 mg | ORAL_TABLET | Freq: Two times a day (BID) | ORAL | Status: DC

## 2013-09-30 MED ORDER — HYDROCODONE-HOMATROPINE 5-1.5 MG/5ML PO SYRP
ORAL_SOLUTION | ORAL | Status: DC
Start: 2013-09-30 — End: 2013-10-03

## 2013-09-30 NOTE — Progress Notes (Signed)
Pre visit review using our clinic review tool, if applicable. No additional management support is needed unless otherwise documented below in the visit note. 

## 2013-09-30 NOTE — Patient Instructions (Signed)
Hydromet.........Marland Kitchen 1/2-1 teaspoon at bedtime when necessary for cough  Biaxin 250 mg......... one twice daily for 10 days  Return in 2 weeks for followup  Stop the Norvasc  BP checked every morning.........Marland Kitchen bring a record of volume blood pressure readings to the next appointment in 2 weeks

## 2013-09-30 NOTE — Progress Notes (Signed)
   Subjective:    Patient ID: [name], female    DOB: [birth year], 80 y.o.   MRN: [id number]  HPI [name] is a 80 year old female who resides in a [facility] who comes in today accompanied by her daughter who is her primary caregiver now for evaluation of a cough for 3 weeks and low blood pressure  She's been on Norvasc 5 mg, Lasix 20 mg, Lopressor 25 mg daily for hypertension BP 108/68. The last couple weeks she felt lightheaded and dizzy when she stands up.  She was here 2 weeks ago with a cold. Exam was normal she was given cough syrup which was totally appropriate. The cough has persisted. She has no fever no sputum production   Review of Systems Review of systems otherwise negative    Objective:   Physical Exam  Well-developed and nourished female no acute distress vital signs stable she's afebrile HEENT negative neck was supple no adenopathy lungs are clear except for crackles right base consistent with a right lower lobe community-acquired pneumonia........Marland Kitchen probably mycoplasma and  BP 108/68      Assessment & Plan:  Right lower lobe pneumonia probable mycoplasma,,,,,,,,,,, Maxon 2 and 50 mg twice daily for 10 days  Hypotension.............. stop Norvasc,,,,,,,, BP every morning followup in 2 weeks.

## 2013-10-03 ENCOUNTER — Telehealth: Payer: Self-pay | Admitting: Family Medicine

## 2013-10-03 DIAGNOSIS — J181 Lobar pneumonia, unspecified organism: Principal | ICD-10-CM

## 2013-10-03 DIAGNOSIS — J189 Pneumonia, unspecified organism: Secondary | ICD-10-CM

## 2013-10-03 MED ORDER — HYDROCODONE-HOMATROPINE 5-1.5 MG/5ML PO SYRP: ORAL_SOLUTION | ORAL | Status: DC

## 2013-10-03 NOTE — Telephone Encounter (Signed)
[facility] needs you to clarify directions for HYDROcodone-homatropine (HYCODAN) 5-1.5 MG/5ML syrup   "Sig: 1/2-1 teaspoon at bedtime when necessary for cough" Must say one of the other 1/2 or 1  Fax [phone]

## 2013-10-03 NOTE — Telephone Encounter (Signed)
Spoke with [employer] and new prescription ready for pick up.

## 2013-10-11 ENCOUNTER — Emergency Department (HOSPITAL_COMMUNITY)
Admission: EM | Admit: 2013-10-11 | Discharge: 2013-10-11 | Disposition: A | Discharge status: Home / Self Care | Payer: Medicare Other | Attending: Emergency Medicine | Admitting: Emergency Medicine

## 2013-10-11 ENCOUNTER — Emergency Department (HOSPITAL_COMMUNITY): Payer: Medicare Other

## 2013-10-11 ENCOUNTER — Telehealth: Payer: Self-pay | Admitting: Family Medicine

## 2013-10-11 ENCOUNTER — Encounter (HOSPITAL_COMMUNITY): Payer: Self-pay | Admitting: Emergency Medicine

## 2013-10-11 DIAGNOSIS — M79605 Pain in left leg: Secondary | ICD-10-CM | POA: Insufficient documentation

## 2013-10-11 DIAGNOSIS — I10 Essential (primary) hypertension: Secondary | ICD-10-CM | POA: Diagnosis not present

## 2013-10-11 DIAGNOSIS — Z791 Long term (current) use of non-steroidal anti-inflammatories (NSAID): Secondary | ICD-10-CM | POA: Diagnosis not present

## 2013-10-11 DIAGNOSIS — Z8719 Personal history of other diseases of the digestive system: Secondary | ICD-10-CM | POA: Diagnosis not present

## 2013-10-11 DIAGNOSIS — I252 Old myocardial infarction: Secondary | ICD-10-CM | POA: Insufficient documentation

## 2013-10-11 DIAGNOSIS — R5383 Other fatigue: Secondary | ICD-10-CM | POA: Diagnosis not present

## 2013-10-11 DIAGNOSIS — R0989 Other specified symptoms and signs involving the circulatory and respiratory systems: Secondary | ICD-10-CM | POA: Insufficient documentation

## 2013-10-11 DIAGNOSIS — F419 Anxiety disorder, unspecified: Secondary | ICD-10-CM | POA: Insufficient documentation

## 2013-10-11 DIAGNOSIS — Z9889 Other specified postprocedural states: Secondary | ICD-10-CM | POA: Insufficient documentation

## 2013-10-11 DIAGNOSIS — R0602 Shortness of breath: Secondary | ICD-10-CM | POA: Diagnosis present

## 2013-10-11 DIAGNOSIS — Z79899 Other long term (current) drug therapy: Secondary | ICD-10-CM | POA: Insufficient documentation

## 2013-10-11 DIAGNOSIS — Z85038 Personal history of other malignant neoplasm of large intestine: Secondary | ICD-10-CM | POA: Insufficient documentation

## 2013-10-11 DIAGNOSIS — Z880 Allergy status to penicillin: Secondary | ICD-10-CM | POA: Diagnosis not present

## 2013-10-11 DIAGNOSIS — Z872 Personal history of diseases of the skin and subcutaneous tissue: Secondary | ICD-10-CM | POA: Diagnosis not present

## 2013-10-11 DIAGNOSIS — Z853 Personal history of malignant neoplasm of breast: Secondary | ICD-10-CM | POA: Insufficient documentation

## 2013-10-11 DIAGNOSIS — Z862 Personal history of diseases of the blood and blood-forming organs and certain disorders involving the immune mechanism: Secondary | ICD-10-CM | POA: Diagnosis not present

## 2013-10-11 DIAGNOSIS — I2510 Atherosclerotic heart disease of native coronary artery without angina pectoris: Secondary | ICD-10-CM | POA: Diagnosis not present

## 2013-10-11 DIAGNOSIS — Z7902 Long term (current) use of antithrombotics/antiplatelets: Secondary | ICD-10-CM | POA: Insufficient documentation

## 2013-10-11 DIAGNOSIS — Z7982 Long term (current) use of aspirin: Secondary | ICD-10-CM | POA: Diagnosis not present

## 2013-10-11 DIAGNOSIS — H905 Unspecified sensorineural hearing loss: Secondary | ICD-10-CM | POA: Insufficient documentation

## 2013-10-11 DIAGNOSIS — Z8742 Personal history of other diseases of the female genital tract: Secondary | ICD-10-CM | POA: Insufficient documentation

## 2013-10-11 LAB — CBC WITH DIFFERENTIAL/PLATELET
BASOS ABS: 0 10*3/uL (ref 0.0–0.1)
Basophils Relative: 1 % (ref 0–1)
Eosinophils Absolute: 0.5 10*3/uL (ref 0.0–0.7)
Eosinophils Relative: 7 % — ABNORMAL HIGH (ref 0–5)
HCT: 35.4 % — ABNORMAL LOW (ref 36.0–46.0)
Hemoglobin: 12 g/dL (ref 12.0–15.0)
Lymphocytes Relative: 23 % (ref 12–46)
Lymphs Abs: 1.5 10*3/uL (ref 0.7–4.0)
MCH: 33.1 pg (ref 26.0–34.0)
MCHC: 33.9 g/dL (ref 30.0–36.0)
MCV: 97.8 fL (ref 78.0–100.0)
Monocytes Absolute: 0.8 10*3/uL (ref 0.1–1.0)
Monocytes Relative: 13 % — ABNORMAL HIGH (ref 3–12)
NEUTROS ABS: 3.6 10*3/uL (ref 1.7–7.7)
NEUTROS PCT: 56 % (ref 43–77)
Platelets: 130 10*3/uL — ABNORMAL LOW (ref 150–400)
RBC: 3.62 MIL/uL — ABNORMAL LOW (ref 3.87–5.11)
RDW: 13.8 % (ref 11.5–15.5)
WBC: 6.4 10*3/uL (ref 4.0–10.5)

## 2013-10-11 LAB — URINALYSIS, ROUTINE W REFLEX MICROSCOPIC
BILIRUBIN URINE: NEGATIVE
Glucose, UA: NEGATIVE mg/dL
Hgb urine dipstick: NEGATIVE
Ketones, ur: NEGATIVE mg/dL
Leukocytes, UA: NEGATIVE
NITRITE: NEGATIVE
PH: 7.5 (ref 5.0–8.0)
Protein, ur: NEGATIVE mg/dL
Specific Gravity, Urine: 1.008 (ref 1.005–1.030)
Urobilinogen, UA: 0.2 mg/dL (ref 0.0–1.0)

## 2013-10-11 LAB — I-STAT CHEM 8, ED
BUN: 13 mg/dL (ref 6–23)
CHLORIDE: 99 meq/L (ref 96–112)
Calcium, Ion: 1.06 mmol/L — ABNORMAL LOW (ref 1.13–1.30)
Creatinine, Ser: 0.7 mg/dL (ref 0.50–1.10)
Glucose, Bld: 104 mg/dL — ABNORMAL HIGH (ref 70–99)
HCT: 37 % (ref 36.0–46.0)
Hemoglobin: 12.6 g/dL (ref 12.0–15.0)
POTASSIUM: 3.9 meq/L (ref 3.7–5.3)
Sodium: 134 mEq/L — ABNORMAL LOW (ref 137–147)
TCO2: 25 mmol/L (ref 0–100)

## 2013-10-11 LAB — I-STAT TROPONIN, ED: Troponin i, poc: 0 ng/mL (ref 0.00–0.08)

## 2013-10-11 LAB — D-DIMER, QUANTITATIVE: D-Dimer, Quant: 0.85 ug/mL-FEU — ABNORMAL HIGH (ref 0.00–0.48)

## 2013-10-11 LAB — PRO B NATRIURETIC PEPTIDE: PRO B NATRI PEPTIDE: 1397 pg/mL — AB (ref 0–450)

## 2013-10-11 LAB — I-STAT CG4 LACTIC ACID, ED: LACTIC ACID, VENOUS: 0.89 mmol/L (ref 0.5–2.2)

## 2013-10-11 MED ORDER — IOHEXOL 350 MG/ML SOLN
100.0000 mL | Freq: Once | INTRAVENOUS | Status: AC | PRN
  Administered 2013-10-11: 100 mL via INTRAVENOUS

## 2013-10-11 NOTE — ED Notes (Signed)
Both IVs removed per RN

## 2013-10-11 NOTE — ED Notes (Signed)
Pt remains in ct 

## 2013-10-11 NOTE — Discharge Instructions (Signed)
Please follow up closely with your doctor for further evaluation of your generalized fatigue.  Your evaluation in the ER today is without acute finding.  Return if your condition worsen or if you have other concerns.    Fatigue Fatigue is a feeling of tiredness, lack of energy, lack of motivation, or feeling tired all the time. Having enough rest, good nutrition, and reducing stress will normally reduce fatigue. Consult your caregiver if it persists. The nature of your fatigue will help your caregiver to find out its cause. The treatment is based on the cause.  CAUSES  There are many causes for fatigue. Most of the time, fatigue can be traced to one or more of your habits or routines. Most causes fit into one or more of three general areas. They are: Lifestyle problems  Sleep disturbances.  Overwork.  Physical exertion.  Unhealthy habits.  Poor eating habits or eating disorders.  Alcohol and/or drug use .  Lack of proper nutrition (malnutrition). Psychological problems  Stress and/or anxiety problems.  Depression.  Grief.  Boredom. Medical Problems or Conditions  Anemia.  Pregnancy.  Thyroid gland problems.  Recovery from major surgery.  Continuous pain.  Emphysema or asthma that is not well controlled  Allergic conditions.  Diabetes.  Infections (such as mononucleosis).  Obesity.  Sleep disorders, such as sleep apnea.  Heart failure or other heart-related problems.  Cancer.  Kidney disease.  Liver disease.  Effects of certain medicines such as antihistamines, cough and cold remedies, prescription pain medicines, heart and blood pressure medicines, drugs used for treatment of cancer, and some antidepressants. SYMPTOMS  The symptoms of fatigue include:   Lack of energy.  Lack of drive (motivation).  Drowsiness.  Feeling of indifference to the surroundings. DIAGNOSIS  The details of how you feel help guide your caregiver in finding out what is  causing the fatigue. You will be asked about your present and past health condition. It is important to review all medicines that you take, including prescription and non-prescription items. A thorough exam will be done. You will be questioned about your feelings, habits, and normal lifestyle. Your caregiver may suggest blood tests, urine tests, or other tests to look for common medical causes of fatigue.  TREATMENT  Fatigue is treated by correcting the underlying cause. For example, if you have continuous pain or depression, treating these causes will improve how you feel. Similarly, adjusting the dose of certain medicines will help in reducing fatigue.  HOME CARE INSTRUCTIONS   Try to get the required amount of good sleep every night.  Eat a healthy and nutritious diet, and drink enough water throughout the day.  Practice ways of relaxing (including yoga or meditation).  Exercise regularly.  Make plans to change situations that cause stress. Act on those plans so that stresses decrease over time. Keep your work and personal routine reasonable.  Avoid street drugs and minimize use of alcohol.  Start taking a daily multivitamin after consulting your caregiver. SEEK MEDICAL CARE IF:   You have persistent tiredness, which cannot be accounted for.  You have fever.  You have unintentional weight loss.  You have headaches.  You have disturbed sleep throughout the night.  You are feeling sad.  You have constipation.  You have dry skin.  You have gained weight.  You are taking any new or different medicines that you suspect are causing fatigue.  You are unable to sleep at night.  You develop any unusual swelling of your legs or  other parts of your body. SEEK IMMEDIATE MEDICAL CARE IF:   You are feeling confused.  Your vision is blurred.  You feel faint or pass out.  You develop severe headache.  You develop severe abdominal, pelvic, or back pain.  You develop chest  pain, shortness of breath, or an irregular or fast heartbeat.  You are unable to pass a normal amount of urine.  You develop abnormal bleeding such as bleeding from the rectum or you vomit blood.  You have thoughts about harming yourself or committing suicide.  You are worried that you might harm someone else. MAKE SURE YOU:   Understand these instructions.  Will watch your condition.  Will get help right away if you are not doing well or get worse. Document Released: 10/17/2006 Document Revised: 03/14/2011 Document Reviewed: 04/23/2013 [facility] Patient Information 2015 [place], [place]. This information is not intended to replace advice given to you by your health care provider. Make sure you discuss any questions you have with your health care provider.

## 2013-10-11 NOTE — ED Provider Notes (Signed)
CSN: [id number]     Arrival date & time 10/11/13  1326 History   First MD Initiated Contact with Patient 10/11/13 1332     Chief Complaint  Patient presents with  . Shortness of Breath     (Consider location/radiation/quality/duration/timing/severity/associated sxs/prior Treatment) HPI 80 year old female with prior history of breast cancer status post lobectomy, CAD, hypertension, pulmonary fibrosis and who was sent here from nursing facility at recommendation of her Dr. for further evaluation of her shortness of breath. Patient was recently treated for right lower lobe pneumonia with biaxin which was started on 09/30/2013 and completed on 10/07/2013.  Family member report she was doing better but yesterday morning she c/o generalized fatigue.  Patient is a poor historian, most of the history is obtained through daughter who was at bedside. Patient reports throughout yesterday she feels very tired, as if she has ran a marathon. She stayed in bed throughout most of the day. Last night she also complaining of chest pressure that was persistent. The chest pressure has mostly resolved this morning. She doesn't complain of any significant shortness of breath but states that she is a little bit out of breath when she lays down, it seems to improve with sitting up. Daughter mentions she did complain of chills yesterday but patient declined. No complaints of fever, headache, lightheadedness, dizziness, diaphoresis, active chest pain, productive cough, hemoptysis, back pain, abdominal pain, nausea, vomiting, dysuria, focal numbness. Family member did contact patient's PCP who recommend her to come to the ER for further evaluation. Of note, patient was recently taken off her blood pressure medication for the past week by her PCP due to having low blood pressure from last visit.     Past Medical History  Diagnosis Date  . Breast cancer     lumpectomy followed by RT and Tamoxifen x 5 years  . Hypertension   .  CAD (coronary artery disease)     a. Mod nonobstructive by cath 2006. b. stress test 2007- no evidence of ischemia; EF 58%  . Dermatitis   . Female stress incontinence   . Seborrheic keratosis, inflamed   . Pulmonary fibrosis   . Hearing loss   . Erosive esophagitis 05/19/10  . Hiatal hernia 05/19/10    5 cm  . Anemia   . Diverticulosis 05/19/10    moderate  . Anxiety   . Colon cancer     a. 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 Tx with Xeloda.  . Stricture esophagus 06/08/10    EGD  . Gastritis   . NSTEMI (non-ST elevated myocardial infarction)     [place]  . Premature atrial contractions   . Pulmonary fibrosis    Past Surgical History  Procedure Laterality Date  . Breast lumpectomy    . Shoulder arthroscopy w/ acromial repair      bilateral  . Appendectomy    . Colon resection  05/20/2010    [facility]  . Total abdominal hysterectomy w/ bilateral salpingoophorectomy    . Cardiac catheterization  2006    40% ostial LAD, 60% mid LAD followed by 30-40% disease, 50% just after D2, 60-70% D2 lesion followed by 50% disease, normal LCx/RCA; EF 55%   Family History  Problem Relation Age of Onset  . Heart disease Mother   . Colon polyps Daughter   . Colon cancer Brother     in 60s  . Breast cancer Daughter   .  Kidney failure Father    History  Substance Use Topics  . Smoking status: Former Smoker    Types: Cigarettes    Quit date: 01/04/1983  . Smokeless tobacco: Never Used     Comment: Quit 25-30 years ago  . Alcohol Use: 2.0 oz/week    4 drink(s) per week     Comment: a drink a night, a little scotch helps you   OB History   Grav Para Term Preterm Abortions TAB SAB Ect Mult Living                 Review of Systems  All other systems reviewed and are negative.     Allergies  Codeine; Indomethacin; Penicillins; and Sulfonamide derivatives  Home Medications    Prior to Admission medications   Medication Sig Start Date End Date Taking? Authorizing Provider  acetaminophen (TYLENOL) 325 MG tablet Take 650 mg by mouth every 6 (six) hours as needed for pain.    Historical Provider, MD  amLODipine (NORVASC) 5 MG tablet Take 1 tablet (5 mg total) by mouth daily. 02/04/13   Dorena Cookey, MD  ASPIRIN LOW DOSE 81 MG EC tablet TAKE ONE TABLET BY MOUTH ONCE DAILY. 04/10/13   Dorena Cookey, MD  AZOPT 1 % ophthalmic suspension Place 1 drop into both eyes 2 (two) times daily.  11/15/10   Historical Provider, MD  bimatoprost (LUMIGAN) 0.01 % SOLN Place 1 drop into both eyes at bedtime.    Historical Provider, MD  celecoxib (CELEBREX) 200 MG capsule TAKE (1) CAPSULE BY MOUTH ONCE DAILY. 04/16/13   Dorena Cookey, MD  citalopram (CELEXA) 10 MG tablet Take 1 tablet (10 mg total) by mouth daily. 02/04/13   Dorena Cookey, MD  clarithromycin (BIAXIN) 250 MG tablet Take 1 tablet (250 mg total) by mouth 2 (two) times daily. 09/30/13   Dorena Cookey, MD  clopidogrel (PLAVIX) 75 MG tablet Take 1 tablet (75 mg total) by mouth daily. 02/04/13   Dorena Cookey, MD  cycloSPORINE (RESTASIS) 0.05 % ophthalmic emulsion Place 1 drop into both eyes 2 (two) times daily.     Historical Provider, MD  donepezil (ARICEPT) 5 MG tablet Take 1 tablet (5 mg total) by mouth at bedtime. 02/04/13   Dorena Cookey, MD  fluocinonide cream (LIDEX) 0.05 % APPLY TOPICALLY 2 TIMES A DAY 02/04/13   Dorena Cookey, MD  furosemide (LASIX) 20 MG tablet take 1 tablet by mouth once daily 02/15/13   Dorena Cookey, MD  guaifenesin (TUSSIN EXPECTORANT) 100 MG/5ML syrup Take 200 mg by mouth 3 (three) times daily as needed for cough.    Historical Provider, MD  HYDROcodone-homatropine [facility]) 5-1.5 MG/5ML syrup 1/2  teaspoon at bedtime when necessary for cough 10/03/13   Dorena Cookey, MD  metoprolol tartrate (LOPRESSOR) 25 MG tablet One tablet daily in the morning 02/04/13   Dorena Cookey, MD  Multiple Vitamin  (MULTIVITAMIN WITH MINERALS) TABS tablet Take 1 tablet by mouth daily.    Historical Provider, MD  nitroGLYCERIN (NITROSTAT) 0.4 MG SL tablet Place 1 tablet (0.4 mg total) under the tongue every 5 (five) minutes as needed for chest pain (up to 3 doses). 02/04/13   Dorena Cookey, MD  potassium chloride (K-DUR,KLOR-CON) 10 MEQ tablet Take 1 tablet (10 mEq total) by mouth every Monday, Wednesday, and Friday. 02/04/13   Dorena Cookey, MD   BP 176/75  Pulse 59  Temp(Src) 98.1 F (36.7 C) (Oral)  Resp 18  Ht 5\' 6"  (1.676 m)  Wt 170 lb (77.111 kg)  BMI 27.45 kg/m2  SpO2 100% Physical Exam  Nursing note and vitals reviewed. Constitutional: She is oriented to person, place, and time. She appears well-developed and well-nourished. No distress.  HENT:  Head: Atraumatic.  Eyes: Conjunctivae are normal.  Neck: Neck supple. No JVD present.  No nuchal rigidity  Cardiovascular: Normal rate and regular rhythm.   Pulmonary/Chest: Effort normal. She has rales (Rales heard at right lower lung base).  Abdominal: Soft. There is no tenderness.  Musculoskeletal: She exhibits tenderness (Tenderness to left calf on palpation without palpable cords, erythema, or edema ). She exhibits no edema.  Neurological: She is alert and oriented to person, place, and time.  Skin: No rash noted.  Psychiatric: She has a normal mood and affect.    ED Course  Procedures (including critical care time)  2:00 PM Patient initially presents complaining of generalized fatigue. She was treated for pneumonia previously. She did complain of chest pressure yesterday but none since. Her examination has been unremarkable. No evidence of infection both on chest x-ray and UA. She is afebrile. Her blood pressure is elevated and will need to be reassessed by her primary care Dr. Her d-dimer is elevated at 0.85. Will get chest CT angio to rule out PE.  4:19 PM Care discussed with Dr. Winfred Leeds and with Irena Cords, PA-C who will continue  with current care.  Pt aware of plan.    Labs Review Labs Reviewed  CBC WITH DIFFERENTIAL - Abnormal; Notable for the following:    RBC 3.62 (*)    HCT 35.4 (*)    Platelets 130 (*)    Monocytes Relative 13 (*)    Eosinophils Relative 7 (*)    All other components within normal limits  PRO B NATRIURETIC PEPTIDE - Abnormal; Notable for the following:    Pro B Natriuretic peptide (BNP) 1397.0 (*)    All other components within normal limits  D-DIMER, QUANTITATIVE - Abnormal; Notable for the following:    D-Dimer, Quant 0.85 (*)    All other components within normal limits  I-STAT CHEM 8, ED - Abnormal; Notable for the following:    Sodium 134 (*)    Glucose, Bld 104 (*)    Calcium, Ion 1.06 (*)    All other components within normal limits  URINALYSIS, ROUTINE W REFLEX MICROSCOPIC  I-STAT CG4 LACTIC ACID, ED  Randolm Idol, ED    Imaging Review Dg Chest 2 View  10/11/2013   CLINICAL DATA:  Chest pain with weakness  EXAM: CHEST  2 VIEW  COMPARISON:  October 02, 2012  FINDINGS: There is patchy interstitial fibrosis throughout the lungs bilaterally, essentially stable. There is no frank edema or consolidation. Heart is mildly enlarged with pulmonary vascularity within normal limits. No adenopathy. Bones are somewhat osteoporotic.  IMPRESSION: Chronic interstitial fibrosis, stable. No frank edema or consolidation. Stable cardiomegaly. Bones osteoporotic.   Electronically Signed   By: Lowella Grip M.D.   On: 10/11/2013 15:09     EKG Interpretation None      MDM   Final diagnoses:  Other fatigue    BP 154/87  Pulse 77  Temp(Src) 99.1 F (37.3 C) (Rectal)  Resp 16  Ht 5\' 6"  (1.676 m)  Wt 170 lb (77.111 kg)  BMI 27.45 kg/m2  SpO2 96%  I have reviewed nursing notes and vital signs. I personally reviewed the imaging tests through PACS system  I reviewed  available ER/hospitalization records thought the EMR     Domenic Moras, PA-C 10/11/13 1620

## 2013-10-11 NOTE — Telephone Encounter (Signed)
Patient Information:  Caller Name: [name]  Phone: [phone]  Patient: [name], [name]  Gender: Female  DOB: 09-14-1920  Age: 80 Years  PCP: Stevie Kern [facility])  Office Follow Up:  Does the office need to follow up with this patient?: No  Instructions For The Office: N/A  RN Note: F/U call:  Triage RN called daughter back and mom is with her now; pt states that she is having chest tightness and heaviness that started on 10/10/13 and is still presently there; pt states that it feels that she has a weight on her chest.  Office called and made aware. Daughter instructed to call 911 and go to [facility] ED; will comply   Unable to triage at present time since pt is not with caller.  Daughter will go to pt's assisted lliving facility now and RN to call back in 30 minutes per daughter request.  Symptoms  Reason For Call & Symptoms: Pt is being treated for pneumonia; antibiotic was started 09/30/2013; completed on 10/07/13;  seemed to be doing better but yesterday morning 10/10/13 she started c/o being very weak, pale and trouble catching breath; caller states that she doesn't think she truly is having difficulty breathing it is just that she doesn't feel well;  daughter was with pt and pt had no trouble talking; just saying she didn't feel well;  Reviewed Health History In EMR: Yes  Reviewed Medications In EMR: Yes  Reviewed Allergies In EMR: Yes  Reviewed Surgeries / Procedures: Yes  Date of Onset of Symptoms: 10/10/2013  Guideline(s) Used:  Weakness (Generalized) and Fatigue  No Protocol Available - Information Only  Disposition Per Guideline:   Home Care  Reason For Disposition Reached:   Information only question and nurse able to answer  Advice Given:  Call Back If:  You become worse.  RN Overrode Recommendation:  Document Patient  RN to call daughter back in 30 minutes to triage.  Triage RN called daughter back and mom is with her now; pt states that she is having  chest tightness and heaviness that started on 10/10/13 and is still presently there; pt states that it feels that she has a weight on her chest.  Office called and made aware. Daughter instructed to call 911 and go to [facility] ED; will comply

## 2013-10-11 NOTE — ED Provider Notes (Signed)
See prior note   Janice Norrie, MD 10/11/13 [phone]

## 2013-10-11 NOTE — ED Notes (Signed)
Per EMS pt was recently dx. With pneumonia and put on abx. Pt still taking abx but has had progressive shortness of breath and generalized weakness. Pt in NAD

## 2013-10-11 NOTE — ED Provider Notes (Signed)
  Patient is here for her daughter. She had URI symptoms for about 10 days and then was diagnosed with pneumonia. She had 7 days of antibiotics and then felt well for about the following 2 days. However 2 days ago she started having exhaustion. She started complaining of a weight on her chest "like an elephant". She denies any chest pain. She denies the cough difficulty breathing. Her cough has been improved and gone. She has not had any fever.  Patient is a pleasant, alert and cooperative. She is pale. Her chest is nontender to palpation. I do not hear any obvious wheezing or rhonchi one of [place] and her lungs. She has no peripheral edema.   Medical screening examination/treatment/procedure(s) were conducted as a shared visit with non-physician practitioner(s) and myself.  I personally evaluated the patient during the encounter.   EKG Interpretation   Date/Time:  Friday October 11 2013 13:36:34 EDT Ventricular Rate:  56 PR Interval:  154 QRS Duration: 142 QT Interval:  507 QTC Calculation: 489 R Axis:   -20 Text Interpretation:  Sinus rhythm Ventricular premature complex Right  bundle branch block No significant change since last tracing 02 Oct 2012  Confirmed by [facility]  MD-I, Florita Nitsch ([id number]) on 10/11/2013 2:32:12 PM       Rolland Porter, MD, Alanson Aly, MD 10/11/13 1433

## 2013-10-11 NOTE — ED Notes (Signed)
To ct

## 2013-10-11 NOTE — ED Provider Notes (Signed)
Patient is stable at this time and made aware of the CTA results.   Brent General, PA-C 10/11/13 1810

## 2013-10-11 NOTE — Telephone Encounter (Signed)
Patient Information:  Caller Name: [name]  Phone: [phone]  Patient: [name], [name]  Gender: Female  DOB: [birth year]  Age: 80 Years  PCP: Stevie Kern [facility])  Office Follow Up:  Does the office need to follow up with this patient?: No  Instructions For The Office: N/A  RN Note:  Unable to triage at present time since pt is not with caller.  Daughter will go to pt's assisted lliving facility now and RN to call back in 30 minutes per daughter request.  Symptoms  Reason For Call & Symptoms: Pt is being treated for pneumonia; antibiotic was started 09/30/2013; completed on 10/07/13;  seemed to be doing better but yesterday morning 10/10/13 she started c/o being very weak, pale and trouble catching breath; caller states that she doesn't think she truly is having difficulty breathing it is just that she doesn't feel well;  daughter was with pt and pt had no trouble talking; just saying she didn't feel well;  Reviewed Health History In EMR: Yes  Reviewed Medications In EMR: Yes  Reviewed Allergies In EMR: Yes  Reviewed Surgeries / Procedures: Yes  Date of Onset of Symptoms: 10/10/2013  Guideline(s) Used:  Weakness (Generalized) and Fatigue  No Protocol Available - Information Only  Disposition Per Guideline:   Home Care  Reason For Disposition Reached:   Information only question and nurse able to answer  Advice Given:  Call Back If:  You become worse.  RN Overrode Recommendation:  Document Patient  RN to call daughter back in 30 minutes to triage

## 2013-10-11 NOTE — ED Notes (Signed)
Pt ambulated and assisted to bathroom, no lightheadedness or unsteady gait. Pts daughter in restroom providing assistance if needed

## 2013-10-12 NOTE — ED Provider Notes (Signed)
Medical screening examination/treatment/procedure(s) were performed by non-physician practitioner and as supervising physician I was immediately available for consultation/collaboration.   EKG Interpretation   Date/Time:  Friday October 11 2013 13:36:34 EDT Ventricular Rate:  56 PR Interval:  154 QRS Duration: 142 QT Interval:  507 QTC Calculation: 489 R Axis:   -20 Text Interpretation:  Sinus rhythm Ventricular premature complex Right  bundle branch block No significant change since last tracing 02 Oct 2012  Confirmed by [facility]  MD-I, IVA ([id number]) on 10/11/2013 2:32:12 PM       Orlie Dakin, MD 10/12/13 7591

## 2013-10-14 NOTE — Telephone Encounter (Signed)
Noted  

## 2013-10-21 ENCOUNTER — Ambulatory Visit (INDEPENDENT_AMBULATORY_CARE_PROVIDER_SITE_OTHER): Payer: Medicare Other | Admitting: Family Medicine

## 2013-10-21 ENCOUNTER — Telehealth: Payer: Self-pay | Admitting: Cardiology

## 2013-10-21 ENCOUNTER — Encounter: Payer: Self-pay | Admitting: Family Medicine

## 2013-10-21 VITALS — BP 110/80 | HR 63 | Temp 97.4°F | Wt 183.0 lb

## 2013-10-21 DIAGNOSIS — J8410 Pulmonary fibrosis, unspecified: Secondary | ICD-10-CM

## 2013-10-21 DIAGNOSIS — R413 Other amnesia: Secondary | ICD-10-CM

## 2013-10-21 DIAGNOSIS — Z23 Encounter for immunization: Secondary | ICD-10-CM

## 2013-10-21 DIAGNOSIS — I10 Essential (primary) hypertension: Secondary | ICD-10-CM

## 2013-10-21 NOTE — Patient Instructions (Signed)
Continue current medications  Lasix 20 mg.............Marland Kitchen 1 daily in the morning  Cardiac consult with Dr. Lenore Cordia  No salt diet  Walk 30 minutes daily

## 2013-10-21 NOTE — Telephone Encounter (Signed)
See previous 10/21/13 note.

## 2013-10-21 NOTE — Progress Notes (Signed)
   Subjective:    Patient ID: [name], female    DOB: 04-29-1920, 80 y.o.   MRN: [id number]  HPI [name] is a 80 year old female who comes in today accompanied by her daughter who is her primary caregiver who has her health care power of attorney and financial power of attorney because the stoma some is not mentally capable of managing her own affairs.  We transitioned her from independent living in an apartment to assisted living in March of 2015 because she was no longer mentally capable of managing her medication and her for affairs. Over the last couple months her short-term memory has gotten even worse.  She went to the emergency room recently on October 9 because of shortness of breath. Her d-dimer was slightly elevated a CT of her chest showed no pulmonary embolus. She had numerous other diagnostic studies all of which were normal except for her BNP was 1397. We stopped her antihypertensive medicine a couple weeks ago because her blood pressure had dropped too low. BP today 110/80 pulse 70 and regular  She has insurance for long-term care however her insurance was denied for reasons that are totally unclear to me. [name] is not able to manage her own affairs her short-term memory is loss is such that she can't write checks she can't care for self she can give herself her medication.   Review of Systems Review of systems otherwise negative    Objective:   Physical Exam  Well-developed well nourished daily female no acute distress vital signs stable she's afebrile BP 110/80 pulse ox on room air 96  Pulmonary exam shows symmetrical crackles lower extremities. Cardiac exam unchanged      Assessment & Plan:  Shortness of breath with elevated BNP.......... continue current medications........ cardiac consult with Dr. Aurelio Jew......... to see if anything else can be done or if we should just continue conservative treatment

## 2013-10-21 NOTE — Telephone Encounter (Signed)
Received call from patient's daughter she stated she was calling to make appointment with Dr.Hochrein.Stated mother saw Dr.Todd this morning to follow up recent pneumonia and sob.Stated Dr.Todd advised her she should see Dr.Hochrein.Appointment scheduled with Dr.Hochrein 11/04/13 at 11:00 am.

## 2013-10-21 NOTE — Telephone Encounter (Signed)
New Message     Pt's daughter calling stating that pt is having severe sob for about 10 days and it doesn't seem to be getting any better.

## 2013-10-21 NOTE — Progress Notes (Signed)
Pre visit review using our clinic review tool, if applicable. No additional management support is needed unless otherwise documented below in the visit note. 

## 2013-10-24 ENCOUNTER — Telehealth: Payer: Self-pay | Admitting: Cardiology

## 2013-10-24 NOTE — Telephone Encounter (Signed)
Pt.s daughter called and i discussed her options, pt.s daughter agreed with plan

## 2013-10-24 NOTE — Telephone Encounter (Signed)
New Message     Pt's daughter calling stating that pt's condition is worsening. Pt is having increased fatigue, not eating, feeling weaker, feels a heaviness on her chest. Daughter is wanting to know if the pt needs to be seen sooner than her 11/04/13 appt with Dr. Percival Spanish. Please call daughter back to advise.

## 2013-11-04 ENCOUNTER — Encounter: Payer: Self-pay | Admitting: Cardiology

## 2013-11-04 ENCOUNTER — Ambulatory Visit (INDEPENDENT_AMBULATORY_CARE_PROVIDER_SITE_OTHER): Payer: Medicare Other | Admitting: Cardiology

## 2013-11-04 VITALS — BP 142/82 | HR 62 | Ht 65.5 in | Wt 183.1 lb

## 2013-11-04 DIAGNOSIS — I2510 Atherosclerotic heart disease of native coronary artery without angina pectoris: Secondary | ICD-10-CM

## 2013-11-04 DIAGNOSIS — I10 Essential (primary) hypertension: Secondary | ICD-10-CM

## 2013-11-04 NOTE — Progress Notes (Signed)
HPI  Since I last saw her she has had some episodes of breathlessness and is waking up with this complaint. She actually went to the emergency room in October and I reviewed these records. She did have an elevated BNP but normal cardiac enzymes. I looked at her EKG and it was not acute. She had a chest x-ray and CT with some old scarring and some residual from pneumonia but there actually was no evidence of pulmonary edema. Her blood pressure was actually not particularly elevated. However, she's had these episodes several times. She's not walking as much as she was. She walks to the dining hall only once a day. Her daughter is concerned because she does drink 2 or 3 shots of alcohol a night.  Allergies  Allergen Reactions  . Codeine Nausea And Vomiting    REACTION: Stomach cramps  . Indomethacin Other (See Comments)    REACTION: TIA type episodes  . Penicillins Nausea And Vomiting    REACTION: vomit  . Sulfonamide Derivatives Nausea And Vomiting    REACTION: Nausea Vomiting    Current Outpatient Prescriptions  Medication Sig Dispense Refill  . acetaminophen (TYLENOL) 325 MG tablet Take 650 mg by mouth every 6 (six) hours as needed for pain.    Marland Kitchen aspirin EC 81 MG tablet Take 81 mg by mouth daily.    . AZOPT 1 % ophthalmic suspension Place 1 drop into both eyes 2 (two) times daily.     . bimatoprost (LUMIGAN) 0.01 % SOLN Place 1 drop into both eyes at bedtime.    . celecoxib (CELEBREX) 200 MG capsule Take 200 mg by mouth daily.    . citalopram (CELEXA) 10 MG tablet Take 1 tablet (10 mg total) by mouth daily. 100 tablet 3  . clopidogrel (PLAVIX) 75 MG tablet Take 1 tablet (75 mg total) by mouth daily. 100 tablet 3  . donepezil (ARICEPT) 5 MG tablet Take 1 tablet (5 mg total) by mouth at bedtime. (Patient taking differently: Take 5 mg by mouth 3 (three) times a week. Monday, Wednesday, and Friday) 100 tablet 3  . fluocinonide cream (LIDEX) 4.76 % Apply 1 application topically as needed.      . furosemide (LASIX) 20 MG tablet take 1 tablet by mouth once daily 90 tablet 3  . metoprolol tartrate (LOPRESSOR) 25 MG tablet One tablet daily in the morning 100 tablet 3  . Multiple Vitamin (MULTIVITAMIN WITH MINERALS) TABS tablet Take 1 tablet by mouth daily.    . nitroGLYCERIN (NITROSTAT) 0.4 MG SL tablet Place 1 tablet (0.4 mg total) under the tongue every 5 (five) minutes as needed for chest pain (up to 3 doses). 25 tablet 3  . potassium chloride (K-DUR,KLOR-CON) 10 MEQ tablet Take 1 tablet (10 mEq total) by mouth every Monday, Wednesday, and Friday. 100 tablet 3  . sodium chloride (MURO 128) 2 % ophthalmic solution Place 1 drop into the left eye.     No current facility-administered medications for this visit.    Past Medical History  Diagnosis Date  . Breast cancer     lumpectomy followed by RT and Tamoxifen x 5 years  . Hypertension   . CAD (coronary artery disease)     a. Mod nonobstructive by cath 2006. b. stress test 2007- no evidence of ischemia; EF 58%  . Dermatitis   . Female stress incontinence   . Seborrheic keratosis, inflamed   . Pulmonary fibrosis   . Hearing loss   . Erosive esophagitis 05/19/10  .  Hiatal hernia 05/19/10    5 cm  . Anemia   . Diverticulosis 05/19/10    moderate  . Anxiety   . Colon cancer     a. 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 Tx with Xeloda.  . Stricture esophagus 06/08/10    EGD  . Gastritis   . NSTEMI (non-ST elevated myocardial infarction)     [place]  . Premature atrial contractions   . Pulmonary fibrosis     Past Surgical History  Procedure Laterality Date  . Breast lumpectomy    . Shoulder arthroscopy w/ acromial repair      bilateral  . Appendectomy    . Colon resection  05/20/2010    [facility]  . Total abdominal hysterectomy w/ bilateral salpingoophorectomy    . Cardiac catheterization  2006    40% ostial LAD,  60% mid LAD followed by 30-40% disease, 50% just after D2, 60-70% D2 lesion followed by 50% disease, normal LCx/RCA; EF 55%    ROS:  As stated in the HPI and negative for all other systems.  PHYSICAL EXAM Pulse 62  Ht 5' 5.5" (1.664 m)  Wt 183 lb 1.6 oz (83.054 kg)  BMI 30.00 kg/m2 GENERAL:  Frail but in no distress NECK:  No jugular venous distention, waveform within normal limits, carotid upstroke brisk and symmetric, no bruits, no thyromegaly LUNGS:  Clear to auscultation bilaterally BACK:  No CVA tenderness, lordosis HEART:  PMI not displaced or sustained,S1 and S2 within normal limits, no S3, no S4, no clicks, no rubs, no murmurs ABD:  Flat, positive bowel sounds normal in frequency in pitch, no bruits, no rebound, no guarding, no midline pulsatile mass, no hepatomegaly, no splenomegaly EXT:  2 plus pulses throughout, no edema, no cyanosis no clubbing SKIN:  Psoriasis  ASSESSMENT AND PLAN  NSTEMI/SOB:  She is getting episodes are described but without any evidence of ischemia. I did review the emergency room records and that included labs. The only objective finding was a BNP level of 1397. He'll think ischemia although I could not exclude this. She might not be unreasonable to take sublingual nitroglycerin when she is lying down when these episodes occurred. However, per discussion with daughter would prefer conservative therapy.  HTN:  Her blood pressure has actually been low. Medicines have been reduced. No change in therapy is indicated.  CAD:  This has been mild as and we are managing this conservatively as above.   Emergency room records, chart, labs all reviewed for this appointment.

## 2013-11-04 NOTE — Patient Instructions (Signed)
When you become acutely out of breath lie down put a NTG under your tongue   Continue same medications    Your physician wants you to follow-up in: 6 months. You will receive a reminder letter in the mail two months in advance. If you don't receive a letter, please call our office to schedule the follow-up appointment.

## 2013-12-09 ENCOUNTER — Encounter (HOSPITAL_COMMUNITY): Payer: Self-pay | Admitting: Nurse Practitioner

## 2013-12-09 ENCOUNTER — Emergency Department (HOSPITAL_COMMUNITY): Payer: Medicare Other

## 2013-12-09 ENCOUNTER — Inpatient Hospital Stay (HOSPITAL_COMMUNITY)
Admission: EM | Admit: 2013-12-09 | Discharge: 2013-12-12 | DRG: 293 | Disposition: A | Discharge status: Home / Self Care | Payer: Medicare Other | Attending: Internal Medicine | Admitting: Internal Medicine

## 2013-12-09 DIAGNOSIS — Z87891 Personal history of nicotine dependence: Secondary | ICD-10-CM

## 2013-12-09 DIAGNOSIS — R42 Dizziness and giddiness: Secondary | ICD-10-CM | POA: Diagnosis present

## 2013-12-09 DIAGNOSIS — Z885 Allergy status to narcotic agent status: Secondary | ICD-10-CM

## 2013-12-09 DIAGNOSIS — I252 Old myocardial infarction: Secondary | ICD-10-CM | POA: Diagnosis not present

## 2013-12-09 DIAGNOSIS — Z91010 Allergy to peanuts: Secondary | ICD-10-CM

## 2013-12-09 DIAGNOSIS — J8410 Pulmonary fibrosis, unspecified: Secondary | ICD-10-CM | POA: Diagnosis present

## 2013-12-09 DIAGNOSIS — F419 Anxiety disorder, unspecified: Secondary | ICD-10-CM | POA: Diagnosis present

## 2013-12-09 DIAGNOSIS — H9190 Unspecified hearing loss, unspecified ear: Secondary | ICD-10-CM | POA: Diagnosis present

## 2013-12-09 DIAGNOSIS — Z66 Do not resuscitate: Secondary | ICD-10-CM | POA: Diagnosis present

## 2013-12-09 DIAGNOSIS — I5031 Acute diastolic (congestive) heart failure: Secondary | ICD-10-CM | POA: Diagnosis present

## 2013-12-09 DIAGNOSIS — R0600 Dyspnea, unspecified: Secondary | ICD-10-CM | POA: Diagnosis present

## 2013-12-09 DIAGNOSIS — Z7902 Long term (current) use of antithrombotics/antiplatelets: Secondary | ICD-10-CM

## 2013-12-09 DIAGNOSIS — Z888 Allergy status to other drugs, medicaments and biological substances status: Secondary | ICD-10-CM | POA: Diagnosis not present

## 2013-12-09 DIAGNOSIS — Z79899 Other long term (current) drug therapy: Secondary | ICD-10-CM | POA: Diagnosis not present

## 2013-12-09 DIAGNOSIS — Z882 Allergy status to sulfonamides status: Secondary | ICD-10-CM

## 2013-12-09 DIAGNOSIS — Z853 Personal history of malignant neoplasm of breast: Secondary | ICD-10-CM | POA: Diagnosis not present

## 2013-12-09 DIAGNOSIS — M7989 Other specified soft tissue disorders: Secondary | ICD-10-CM | POA: Diagnosis present

## 2013-12-09 DIAGNOSIS — I4510 Unspecified right bundle-branch block: Secondary | ICD-10-CM | POA: Diagnosis present

## 2013-12-09 DIAGNOSIS — D649 Anemia, unspecified: Secondary | ICD-10-CM | POA: Diagnosis present

## 2013-12-09 DIAGNOSIS — Z85038 Personal history of other malignant neoplasm of large intestine: Secondary | ICD-10-CM | POA: Diagnosis not present

## 2013-12-09 DIAGNOSIS — R5381 Other malaise: Secondary | ICD-10-CM | POA: Insufficient documentation

## 2013-12-09 DIAGNOSIS — I2510 Atherosclerotic heart disease of native coronary artery without angina pectoris: Secondary | ICD-10-CM | POA: Diagnosis present

## 2013-12-09 DIAGNOSIS — F329 Major depressive disorder, single episode, unspecified: Secondary | ICD-10-CM | POA: Diagnosis present

## 2013-12-09 DIAGNOSIS — I10 Essential (primary) hypertension: Secondary | ICD-10-CM | POA: Diagnosis present

## 2013-12-09 DIAGNOSIS — I5040 Unspecified combined systolic (congestive) and diastolic (congestive) heart failure: Secondary | ICD-10-CM | POA: Diagnosis not present

## 2013-12-09 DIAGNOSIS — R7989 Other specified abnormal findings of blood chemistry: Secondary | ICD-10-CM | POA: Insufficient documentation

## 2013-12-09 LAB — CBC WITH DIFFERENTIAL/PLATELET
Basophils Absolute: 0 10*3/uL (ref 0.0–0.1)
Basophils Relative: 1 % (ref 0–1)
EOS ABS: 0.3 10*3/uL (ref 0.0–0.7)
Eosinophils Relative: 4 % (ref 0–5)
HCT: 33.4 % — ABNORMAL LOW (ref 36.0–46.0)
Hemoglobin: 10.7 g/dL — ABNORMAL LOW (ref 12.0–15.0)
LYMPHS ABS: 1 10*3/uL (ref 0.7–4.0)
Lymphocytes Relative: 13 % (ref 12–46)
MCH: 31.6 pg (ref 26.0–34.0)
MCHC: 32 g/dL (ref 30.0–36.0)
MCV: 98.5 fL (ref 78.0–100.0)
MONOS PCT: 12 % (ref 3–12)
Monocytes Absolute: 0.9 10*3/uL (ref 0.1–1.0)
Neutro Abs: 5.7 10*3/uL (ref 1.7–7.7)
Neutrophils Relative %: 70 % (ref 43–77)
PLATELETS: 152 10*3/uL (ref 150–400)
RBC: 3.39 MIL/uL — AB (ref 3.87–5.11)
RDW: 14.4 % (ref 11.5–15.5)
WBC: 8 10*3/uL (ref 4.0–10.5)

## 2013-12-09 LAB — COMPREHENSIVE METABOLIC PANEL
ALT: 59 U/L — ABNORMAL HIGH (ref 0–35)
AST: 40 U/L — ABNORMAL HIGH (ref 0–37)
Albumin: 3.4 g/dL — ABNORMAL LOW (ref 3.5–5.2)
Alkaline Phosphatase: 64 U/L (ref 39–117)
Anion gap: 12 (ref 5–15)
BUN: 21 mg/dL (ref 6–23)
CALCIUM: 8.7 mg/dL (ref 8.4–10.5)
CO2: 26 mEq/L (ref 19–32)
CREATININE: 0.84 mg/dL (ref 0.50–1.10)
Chloride: 99 mEq/L (ref 96–112)
GFR calc non Af Amer: 59 mL/min — ABNORMAL LOW (ref >90)
GFR, EST AFRICAN AMERICAN: 68 mL/min — AB (ref >90)
GLUCOSE: 105 mg/dL — AB (ref 70–99)
Potassium: 4.7 mEq/L (ref 3.7–5.3)
Sodium: 137 mEq/L (ref 137–147)
Total Bilirubin: 0.6 mg/dL (ref 0.3–1.2)
Total Protein: 6.1 g/dL (ref 6.0–8.3)

## 2013-12-09 LAB — RETICULOCYTES
RBC.: 3.75 MIL/uL — ABNORMAL LOW (ref 3.87–5.11)
Retic Count, Absolute: 52.5 10*3/uL (ref 19.0–186.0)
Retic Ct Pct: 1.4 % (ref 0.4–3.1)

## 2013-12-09 LAB — URINALYSIS, ROUTINE W REFLEX MICROSCOPIC
Bilirubin Urine: NEGATIVE
Glucose, UA: NEGATIVE mg/dL
Hgb urine dipstick: NEGATIVE
Ketones, ur: NEGATIVE mg/dL
LEUKOCYTES UA: NEGATIVE
Nitrite: NEGATIVE
Protein, ur: NEGATIVE mg/dL
Specific Gravity, Urine: 1.012 (ref 1.005–1.030)
Urobilinogen, UA: 0.2 mg/dL (ref 0.0–1.0)
pH: 6 (ref 5.0–8.0)

## 2013-12-09 LAB — MRSA PCR SCREENING: MRSA by PCR: NEGATIVE

## 2013-12-09 LAB — TROPONIN I

## 2013-12-09 LAB — PRO B NATRIURETIC PEPTIDE: PRO B NATRI PEPTIDE: 4150 pg/mL — AB (ref 0–450)

## 2013-12-09 MED ORDER — LATANOPROST 0.005 % OP SOLN
1.0000 [drp] | Freq: Every day | OPHTHALMIC | Status: DC
  Administered 2013-12-09 – 2013-12-11 (×3): 1 [drp] via OPHTHALMIC
  Filled 2013-12-09: qty 2.5

## 2013-12-09 MED ORDER — ADULT MULTIVITAMIN W/MINERALS CH
1.0000 | ORAL_TABLET | Freq: Every day | ORAL | Status: DC
  Administered 2013-12-10 – 2013-12-12 (×3): 1 via ORAL
  Filled 2013-12-09 (×3): qty 1

## 2013-12-09 MED ORDER — SODIUM CHLORIDE 0.9 % IJ SOLN: 3.0000 mL | INTRAMUSCULAR | Status: DC | PRN

## 2013-12-09 MED ORDER — NITROGLYCERIN 0.4 MG SL SUBL: 0.4000 mg | SUBLINGUAL_TABLET | SUBLINGUAL | Status: DC | PRN

## 2013-12-09 MED ORDER — CLOPIDOGREL BISULFATE 75 MG PO TABS
75.0000 mg | ORAL_TABLET | Freq: Every day | ORAL | Status: DC
  Administered 2013-12-10 – 2013-12-12 (×3): 75 mg via ORAL
  Filled 2013-12-09 (×3): qty 1

## 2013-12-09 MED ORDER — SODIUM CHLORIDE (HYPERTONIC) 2 % OP SOLN: 1.0000 [drp] | OPHTHALMIC | Status: DC | PRN

## 2013-12-09 MED ORDER — FUROSEMIDE 10 MG/ML IJ SOLN
40.0000 mg | Freq: Two times a day (BID) | INTRAMUSCULAR | Status: DC
  Administered 2013-12-09 – 2013-12-10 (×2): 40 mg via INTRAVENOUS
  Filled 2013-12-09 (×2): qty 4

## 2013-12-09 MED ORDER — ACETAMINOPHEN 325 MG PO TABS
650.0000 mg | ORAL_TABLET | ORAL | Status: DC | PRN
Start: 2013-12-09 — End: 2013-12-12
  Administered 2013-12-11: 650 mg via ORAL
  Filled 2013-12-09: qty 2

## 2013-12-09 MED ORDER — FUROSEMIDE 10 MG/ML IJ SOLN: 40.0000 mg | Freq: Every day | INTRAMUSCULAR | Status: DC

## 2013-12-09 MED ORDER — SODIUM CHLORIDE (HYPERTONIC) 5 % OP SOLN
1.0000 [drp] | OPHTHALMIC | Status: DC | PRN
  Filled 2013-12-09: qty 15

## 2013-12-09 MED ORDER — CITALOPRAM HYDROBROMIDE 10 MG PO TABS
10.0000 mg | ORAL_TABLET | Freq: Every day | ORAL | Status: DC
  Administered 2013-12-10 – 2013-12-12 (×3): 10 mg via ORAL
  Filled 2013-12-09 (×3): qty 1

## 2013-12-09 MED ORDER — SODIUM CHLORIDE 0.9 % IV SOLN: 250.0000 mL | INTRAVENOUS | Status: DC | PRN

## 2013-12-09 MED ORDER — FUROSEMIDE 10 MG/ML IJ SOLN
40.0000 mg | Freq: Once | INTRAMUSCULAR | Status: AC
  Administered 2013-12-09: 40 mg via INTRAVENOUS
  Filled 2013-12-09: qty 4

## 2013-12-09 MED ORDER — SODIUM CHLORIDE 0.9 % IJ SOLN
3.0000 mL | Freq: Two times a day (BID) | INTRAMUSCULAR | Status: DC
  Administered 2013-12-09 – 2013-12-12 (×6): 3 mL via INTRAVENOUS

## 2013-12-09 MED ORDER — LISINOPRIL 2.5 MG PO TABS
2.5000 mg | ORAL_TABLET | Freq: Every day | ORAL | Status: DC
  Administered 2013-12-09: 2.5 mg via ORAL
  Filled 2013-12-09 (×2): qty 1

## 2013-12-09 MED ORDER — ONDANSETRON HCL 4 MG/2ML IJ SOLN: 4.0000 mg | Freq: Four times a day (QID) | INTRAMUSCULAR | Status: DC | PRN

## 2013-12-09 MED ORDER — POTASSIUM CHLORIDE CRYS ER 10 MEQ PO TBCR
10.0000 meq | EXTENDED_RELEASE_TABLET | ORAL | Status: DC
  Administered 2013-12-11: 10 meq via ORAL
  Filled 2013-12-09: qty 1

## 2013-12-09 MED ORDER — METOPROLOL TARTRATE 25 MG PO TABS: 25.0000 mg | ORAL_TABLET | Freq: Every day | ORAL | Status: DC

## 2013-12-09 MED ORDER — DONEPEZIL HCL 5 MG PO TABS
5.0000 mg | ORAL_TABLET | ORAL | Status: DC
  Administered 2013-12-09 – 2013-12-11 (×2): 5 mg via ORAL
  Filled 2013-12-09 (×4): qty 1

## 2013-12-09 MED ORDER — CELECOXIB 200 MG PO CAPS
200.0000 mg | ORAL_CAPSULE | Freq: Every day | ORAL | Status: DC
  Administered 2013-12-10 – 2013-12-12 (×3): 200 mg via ORAL
  Filled 2013-12-09 (×3): qty 1

## 2013-12-09 MED ORDER — BRINZOLAMIDE 1 % OP SUSP
1.0000 [drp] | Freq: Two times a day (BID) | OPHTHALMIC | Status: DC
  Administered 2013-12-09 – 2013-12-12 (×6): 1 [drp] via OPHTHALMIC
  Filled 2013-12-09: qty 10

## 2013-12-09 MED ORDER — METOPROLOL SUCCINATE ER 25 MG PO TB24
25.0000 mg | ORAL_TABLET | Freq: Every day | ORAL | Status: DC
  Administered 2013-12-09 – 2013-12-12 (×4): 25 mg via ORAL
  Filled 2013-12-09 (×4): qty 1

## 2013-12-09 NOTE — H&P (Addendum)
[facility] History and Physical  [name] XJO:[id number] DOB: 1920-05-24 DOA: 12/09/2013  Referring physician:  PCP: Joycelyn Man, MD  Specialists:   Chief Complaint: Shortness of breath  HPI: [name] is a 80 y.o. female  With a history of pulmonary fibrosis, hypertension, coronary artery disease that presented to the emergency department with complaints of shortness of breath. She has had a long-standing history of shortness of breath and has been seeing Dr. Percival Spanish, cardiologist, and Dr. Sherren Mocha, PCP.  Patient does not have a history of heart failure however has been taking Lasix. Patient states she had some shortness of breath this morning however it's no different than other days. Per daughter, patient has had increasing swelling of her left lower extremity over the past month. She denies pain with this. Per daughter, patient has not been very ambulatory or engaging since moving into the assisted living community. There may be some attention seeking behavior.  In the emergency department, patient had chest x-ray which did show some interstitial edema, BNP was also elevated. [facility] was asked to admit.  Review of Systems:  Constitutional: Denies fever, chills, diaphoresis, appetite change and fatigue.  HEENT: Denies photophobia, eye pain, redness, hearing loss, ear pain, congestion, sore throat, rhinorrhea, sneezing, mouth sores, trouble swallowing, neck pain, neck stiffness and tinnitus.   Respiratory: Complains of shortness of breath.  Cardiovascular: Denies chest pain, palpitations and leg swelling.  Gastrointestinal: Denies nausea, vomiting, abdominal pain, diarrhea, constipation, blood in stool and abdominal distention.  Genitourinary: Denies dysuria, urgency, frequency, hematuria, flank pain and difficulty urinating.  Musculoskeletal: Denies myalgias, back pain, joint swelling, arthralgias and gait problem.  Skin: Denies pallor, rash and wound.  Neurological:  Denies dizziness, seizures, syncope, weakness, light-headedness, numbness and headaches.  Hematological: Denies adenopathy. Easy bruising, personal or family bleeding history  Psychiatric/Behavioral: Denies suicidal ideation, mood changes, confusion, nervousness, sleep disturbance and agitation  Past Medical History  Diagnosis Date  . Breast cancer     lumpectomy followed by RT and Tamoxifen x 5 years  . Hypertension   . CAD (coronary artery disease)     a. Mod nonobstructive by cath 2006. b. stress test 2007- no evidence of ischemia; EF 58%  . Dermatitis   . Female stress incontinence   . Seborrheic keratosis, inflamed   . Pulmonary fibrosis   . Hearing loss   . Erosive esophagitis 05/19/10  . Hiatal hernia 05/19/10    5 cm  . Anemia   . Diverticulosis 05/19/10    moderate  . Anxiety   . Colon cancer     a. 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 Tx with Xeloda.  . Stricture esophagus 06/08/10    EGD  . Gastritis   . NSTEMI (non-ST elevated myocardial infarction)     [place]  . Premature atrial contractions   . Pulmonary fibrosis    Past Surgical History  Procedure Laterality Date  . Breast lumpectomy    . Shoulder arthroscopy w/ acromial repair      bilateral  . Appendectomy    . Colon resection  05/20/2010    [facility]  . Total abdominal hysterectomy w/ bilateral salpingoophorectomy    . Cardiac catheterization  2006    40% ostial LAD, 60% mid LAD followed by 30-40% disease, 50% just after D2, 60-70% D2 lesion followed by 50% disease, normal LCx/RCA; EF 55%   Social History:  reports that  she quit smoking about 30 years ago. Her smoking use included Cigarettes. She smoked 0.00 packs per day. She has never used smokeless tobacco. She reports that she drinks about 7.0 oz of alcohol per week. She reports that she does not use illicit drugs. Resides at an assisted living  community.  Allergies  Allergen Reactions  . Codeine Nausea And Vomiting    REACTION: Stomach cramps  . Indomethacin Other (See Comments)    REACTION: TIA type episodes  . Penicillins Nausea And Vomiting    REACTION: vomit  . Sulfonamide Derivatives Nausea And Vomiting    REACTION: Nausea Vomiting    Family History  Problem Relation Age of Onset  . Heart disease Mother   . Colon polyps Daughter   . Colon cancer Brother     in 72s  . Breast cancer Daughter   . Kidney failure Father    Prior to Admission medications   Medication Sig Start Date End Date Taking? Authorizing Provider  acetaminophen (TYLENOL) 325 MG tablet Take 650 mg by mouth every 6 (six) hours as needed for pain.   Yes Historical Provider, MD  AZOPT 1 % ophthalmic suspension Place 1 drop into both eyes 2 (two) times daily.  11/15/10  Yes Historical Provider, MD  bimatoprost (LUMIGAN) 0.01 % SOLN Place 1 drop into both eyes at bedtime.   Yes Historical Provider, MD  celecoxib (CELEBREX) 200 MG capsule Take 200 mg by mouth daily.   Yes Historical Provider, MD  citalopram (CELEXA) 10 MG tablet Take 1 tablet (10 mg total) by mouth daily. 02/04/13  Yes Dorena Cookey, MD  clopidogrel (PLAVIX) 75 MG tablet Take 1 tablet (75 mg total) by mouth daily. 02/04/13  Yes Dorena Cookey, MD  donepezil (ARICEPT) 5 MG tablet Take 1 tablet (5 mg total) by mouth at bedtime. Patient taking differently: Take 5 mg by mouth 3 (three) times a week. Monday, Wednesday, and Friday 02/04/13  Yes Dorena Cookey, MD  fluocinonide cream (LIDEX) 0.45 % Apply 1 application topically as needed.    Yes Historical Provider, MD  furosemide (LASIX) 20 MG tablet take 1 tablet by mouth once daily 02/15/13  Yes Dorena Cookey, MD  metoprolol tartrate (LOPRESSOR) 25 MG tablet One tablet daily in the morning 02/04/13  Yes Dorena Cookey, MD  Multiple Vitamin (MULTIVITAMIN WITH MINERALS) TABS tablet Take 1 tablet by mouth daily.   Yes Historical Provider, MD    potassium chloride (K-DUR,KLOR-CON) 10 MEQ tablet Take 1 tablet (10 mEq total) by mouth every Monday, Wednesday, and Friday. 02/04/13  Yes Dorena Cookey, MD  sodium chloride (MURO 128) 2 % ophthalmic solution Place 1 drop into the left eye.   Yes Historical Provider, MD  nitroGLYCERIN (NITROSTAT) 0.4 MG SL tablet Place 1 tablet (0.4 mg total) under the tongue every 5 (five) minutes as needed for chest pain (up to 3 doses). 02/04/13   Dorena Cookey, MD   Physical Exam: Filed Vitals:   12/09/13 1400  BP: 174/79  Pulse: 70  Temp:   Resp: 20     General: Well developed, well nourished, NAD, appears stated age  HEENT: NCAT, PERRLA, EOMI, Anicteic Sclera, mucous membranes moist.   Neck: Supple, no JVD, no masses  Cardiovascular: S1 S2 auscultated, no rubs, murmurs or gallops. Regular rate and rhythm.  Respiratory: Clear to auscultation bilaterally with equal chest rise  Abdomen: Soft, nontender, nondistended, + bowel sounds  Extremities: warm dry without cyanosis clubbing. Edema LLE >RLE  Neuro: AAOx3, cranial nerves grossly intact. Strength equal and bilateral in upper and lower extremities  Skin: Without rashes exudates or nodules  Psych: Normal affect and demeanor with intact judgement and insight  Labs on Admission:  Basic Metabolic Panel:  Recent Labs Lab 12/09/13 1224  NA 137  K 4.7  CL 99  CO2 26  GLUCOSE 105*  BUN 21  CREATININE 0.84  CALCIUM 8.7   Liver Function Tests:  Recent Labs Lab 12/09/13 1224  AST 40*  ALT 59*  ALKPHOS 64  BILITOT 0.6  PROT 6.1  ALBUMIN 3.4*   No results for input(s): LIPASE, AMYLASE in the last 168 hours. No results for input(s): AMMONIA in the last 168 hours. CBC:  Recent Labs Lab 12/09/13 1224  WBC 8.0  NEUTROABS 5.7  HGB 10.7*  HCT 33.4*  MCV 98.5  PLT 152   Cardiac Enzymes:  Recent Labs Lab 12/09/13 1224  TROPONINI <0.30    BNP (last 3 results)  Recent Labs  10/11/13 1406 12/09/13 1224  PROBNP  1397.0* 4150.0*   CBG: No results for input(s): GLUCAP in the last 168 hours.  Radiological Exams on Admission: Dg Chest 2 View  12/09/2013   CLINICAL DATA:  Shortness of breath and weakness  EXAM: CHEST  2 VIEW  COMPARISON:  10/11/2013  FINDINGS: Cardiac shadow remains enlarged. Chronic interstitial changes are again identified. There is a slight increase in the degree of central vascular congestion and interstitial edema consistent with mild CHF. No sizable effusion is seen. No focal confluent infiltrate is noted. No bony abnormality is noted.  IMPRESSION: Increase in vascular congestion and mild interstitial edema.   Electronically Signed   By: Inez Catalina M.D.   On: 12/09/2013 13:47    EKG: Independently reviewed. Sinus rhythm, right bundle branch, rate 68  Assessment/Plan Active Problems:   Dyspnea  Acute on chronic dyspnea -Patient admitted to telemetry -She has a long-standing history of shortness of breath with pulmonary fibrosis, does not use oxygen -Possible etiology of CHF versus ischemia versus extension of pulmonary fibrosis -Chest x-ray: Increasing vascular congestion and mild interstitial edema -Last echocardiogram 10/2012: EF 55% -Will place patient on lasix 40mg  IV BID, monitor I/O and weights, fluid restriction -Cardiology consulted -Echocardiogram ordered -BNP 4150 at admission  Left Lower extremity Swelling -Patient has had increasing swelling of LLE for the past one month -Will order LE doppler to rule out DVT  Hypertension -Continue metoprolol and Lasix -Will place patient on low-dose lisinopril 2.5 mg daily  History of coronary artery disease -Appears stable, patient has no complaints of chest pain -Catheterization in 2006 which showed moderate nonobstructive disease -Continue home medications of Plavix, Lasix, metoprolol  Normocytic Anemia -Baseline Hemoglobin 12 -Hemoglobin on admission 10.7 -Will obtain anemia panel and continue to monitor  CBC -No signs of active bleeding, no complaints of melena or hematochezia  Depression -Continue Celexa  DVT prophylaxis: SCDs  Code Status: DNR  Condition: Guarded  Family Communication: Daughter at bedside. Admission, patients condition and plan of care including tests being ordered have been discussed with the patient and daughter who indicate understanding and agree with the plan and Code Status.  Disposition Plan: Admitted   Time spent: 65 minutes  Preslie Depasquale D.O. [facility] Pager [phone]  If 7PM-7AM, please contact night-coverage [email or web address] Password [facility] 12/09/2013, 2:37 PM

## 2013-12-09 NOTE — ED Provider Notes (Signed)
CSN: [id number]     Arrival date & time 12/09/13  1117 History   First MD Initiated Contact with Patient 12/09/13 1124     No chief complaint on file.    (Consider location/radiation/quality/duration/timing/severity/associated sxs/prior Treatment) HPI Patient presents with concern of dyspnea. Symptoms began spontaneously approximately 5 hours ago.  Prior to that the patient was in her usual state of health.  Since onset symptoms have been persistent.  There is no associated pain, near syncope, syncope, nausea, vomiting, fever, chills, or other complaints. No clear alleviating or exacerbating factors. Patient has multiple medical issues, including CAD, pulmonary fibrosis.  Past Medical History  Diagnosis Date  . Breast cancer     lumpectomy followed by RT and Tamoxifen x 5 years  . Hypertension   . CAD (coronary artery disease)     a. Mod nonobstructive by cath 2006. b. stress test 2007- no evidence of ischemia; EF 58%  . Dermatitis   . Female stress incontinence   . Seborrheic keratosis, inflamed   . Pulmonary fibrosis   . Hearing loss   . Erosive esophagitis 05/19/10  . Hiatal hernia 05/19/10    5 cm  . Anemia   . Diverticulosis 05/19/10    moderate  . Anxiety   . Colon cancer     a. 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 Tx with Xeloda.  . Stricture esophagus 06/08/10    EGD  . Gastritis   . NSTEMI (non-ST elevated myocardial infarction)     [place]  . Premature atrial contractions   . Pulmonary fibrosis    Past Surgical History  Procedure Laterality Date  . Breast lumpectomy    . Shoulder arthroscopy w/ acromial repair      bilateral  . Appendectomy    . Colon resection  05/20/2010    [facility]  . Total abdominal hysterectomy w/ bilateral salpingoophorectomy    . Cardiac catheterization  2006    40% ostial LAD, 60% mid LAD followed by 30-40% disease, 50% just  after D2, 60-70% D2 lesion followed by 50% disease, normal LCx/RCA; EF 55%   Family History  Problem Relation Age of Onset  . Heart disease Mother   . Colon polyps Daughter   . Colon cancer Brother     in 27s  . Breast cancer Daughter   . Kidney failure Father    History  Substance Use Topics  . Smoking status: Former Smoker    Types: Cigarettes    Quit date: 01/04/1983  . Smokeless tobacco: Never Used     Comment: Quit 25-30 years ago  . Alcohol Use: 7.0 oz/week    14 drink(s) per week     Comment: a drink a night, a little scotch helps you   OB History    No data available     Review of Systems  Constitutional:       Per HPI, otherwise negative  HENT:       Per HPI, otherwise negative  Respiratory:       Per HPI, otherwise negative  Cardiovascular:       Per HPI, otherwise negative  Gastrointestinal: Negative for vomiting.  Endocrine:       Negative aside from HPI  Genitourinary:       Neg aside from HPI   Musculoskeletal:       Per HPI, otherwise negative  Skin: Negative.   Neurological: Negative for  syncope.      Allergies  Codeine; Indomethacin; Penicillins; and Sulfonamide derivatives  Home Medications   Prior to Admission medications   Medication Sig Start Date End Date Taking? Authorizing Provider  acetaminophen (TYLENOL) 325 MG tablet Take 650 mg by mouth every 6 (six) hours as needed for pain.    Historical Provider, MD  aspirin EC 81 MG tablet Take 81 mg by mouth daily.    Historical Provider, MD  AZOPT 1 % ophthalmic suspension Place 1 drop into both eyes 2 (two) times daily.  11/15/10   Historical Provider, MD  bimatoprost (LUMIGAN) 0.01 % SOLN Place 1 drop into both eyes at bedtime.    Historical Provider, MD  celecoxib (CELEBREX) 200 MG capsule Take 200 mg by mouth daily.    Historical Provider, MD  citalopram (CELEXA) 10 MG tablet Take 1 tablet (10 mg total) by mouth daily. 02/04/13   Dorena Cookey, MD  clopidogrel (PLAVIX) 75 MG tablet Take  1 tablet (75 mg total) by mouth daily. 02/04/13   Dorena Cookey, MD  donepezil (ARICEPT) 5 MG tablet Take 1 tablet (5 mg total) by mouth at bedtime. Patient taking differently: Take 5 mg by mouth 3 (three) times a week. Monday, Wednesday, and Friday 02/04/13   Dorena Cookey, MD  fluocinonide cream (LIDEX) 6.28 % Apply 1 application topically as needed.     Historical Provider, MD  furosemide (LASIX) 20 MG tablet take 1 tablet by mouth once daily 02/15/13   Dorena Cookey, MD  metoprolol tartrate (LOPRESSOR) 25 MG tablet One tablet daily in the morning 02/04/13   Dorena Cookey, MD  Multiple Vitamin (MULTIVITAMIN WITH MINERALS) TABS tablet Take 1 tablet by mouth daily.    Historical Provider, MD  nitroGLYCERIN (NITROSTAT) 0.4 MG SL tablet Place 1 tablet (0.4 mg total) under the tongue every 5 (five) minutes as needed for chest pain (up to 3 doses). 02/04/13   Dorena Cookey, MD  potassium chloride (K-DUR,KLOR-CON) 10 MEQ tablet Take 1 tablet (10 mEq total) by mouth every Monday, Wednesday, and Friday. 02/04/13   Dorena Cookey, MD  sodium chloride (MURO 128) 2 % ophthalmic solution Place 1 drop into the left eye.    Historical Provider, MD   BP 165/91 mmHg  Pulse 69  Temp(Src) 97.6 F (36.4 C) (Oral)  Resp 20  Ht 5' 6.5" (1.689 m)  Wt 180 lb (81.647 kg)  BMI 28.62 kg/m2  SpO2 99% Physical Exam  Constitutional: She is oriented to person, place, and time. She appears well-developed and well-nourished. No distress.  HENT:  Head: Normocephalic and atraumatic.  Eyes: Conjunctivae and EOM are normal.  Cardiovascular: Normal rate and regular rhythm.   Pulmonary/Chest: Effort normal. No stridor. No respiratory distress. She has decreased breath sounds. She has no wheezes.  Abdominal: She exhibits no distension.  Musculoskeletal: She exhibits no edema.  Neurological: She is alert and oriented to person, place, and time. No cranial nerve deficit.  Skin: Skin is warm and dry.  Psychiatric: She has a normal  mood and affect.  Nursing note and vitals reviewed.   ED Course  Procedures (including critical care time) Labs Review Labs Reviewed  CBC WITH DIFFERENTIAL - Abnormal; Notable for the following:    RBC 3.39 (*)    Hemoglobin 10.7 (*)    HCT 33.4 (*)    All other components within normal limits  COMPREHENSIVE METABOLIC PANEL - Abnormal; Notable for the following:    Glucose, Bld  105 (*)    Albumin 3.4 (*)    AST 40 (*)    ALT 59 (*)    GFR calc non Af Amer 59 (*)    GFR calc Af Amer 68 (*)    All other components within normal limits  PRO B NATRIURETIC PEPTIDE - Abnormal; Notable for the following:    Pro B Natriuretic peptide (BNP) 4150.0 (*)    All other components within normal limits  URINALYSIS, ROUTINE W REFLEX MICROSCOPIC - Abnormal; Notable for the following:    APPearance CLOUDY (*)    All other components within normal limits  TROPONIN I    Imaging Review Dg Chest 2 View  12/09/2013   CLINICAL DATA:  Shortness of breath and weakness  EXAM: CHEST  2 VIEW  COMPARISON:  10/11/2013  FINDINGS: Cardiac shadow remains enlarged. Chronic interstitial changes are again identified. There is a slight increase in the degree of central vascular congestion and interstitial edema consistent with mild CHF. No sizable effusion is seen. No focal confluent infiltrate is noted. No bony abnormality is noted.  IMPRESSION: Increase in vascular congestion and mild interstitial edema.   Electronically Signed   By: Inez Catalina M.D.   On: 12/09/2013 13:47    EMS EKG with sinus rhythm, rate 67, intraventricular conduction block, T-wave abnormalities.   EKG here has similar T-wave abnormalities, intraventricular conduction block, rate 70, abnormal  Pulse ox symmetry 99% with nasal cannula abnormal 92% with room air was abnormal as well.  Update: Patient in no distress. Patient and family aware of all results.     MDM   Final diagnoses:  Dyspnea  Elevated brain natriuretic peptide  (BNP) level    Patient presents with ongoing dyspnea. Patient's family have elevated BNP. Patient has had relatively recent relatively reassuring echocardiogram.  Given the patient's hypoxia, elevated BP, there is some concern for other processes that articulate her, including possible exacerbation of pulmonary hypertension. Patient's vital signs improved with supplemental oxygen. Patient received bolus dose of Lasix, was admitted for further evaluation and management.    Carmin Muskrat, MD 12/09/13 1501

## 2013-12-09 NOTE — Consult Note (Signed)
CARDIOLOGY CONSULT NOTE   Patient ID: [name] MRN: [id number], DOB/AGE: October 12, 1920   Admit date: 12/09/2013 Date of Consult: 12/09/2013   Primary Physician: Joycelyn Man, MD Primary Cardiologist: Dr. Percival Spanish  Pt. Profile  80 year old Caucasian female with past medical history of nonobstructive coronary artery disease by cath 2006, history of pulmonary fibrosis and hypertension present with SOB in the morning that woke her up  Problem List  Past Medical History  Diagnosis Date  . Breast cancer     lumpectomy followed by RT and Tamoxifen x 5 years  . Hypertension   . CAD (coronary artery disease)     a. Mod nonobstructive by cath 2006. b. stress test 2007- no evidence of ischemia; EF 58%  . Dermatitis   . Female stress incontinence   . Seborrheic keratosis, inflamed   . Pulmonary fibrosis   . Hearing loss   . Erosive esophagitis 05/19/10  . Hiatal hernia 05/19/10    5 cm  . Anemia   . Diverticulosis 05/19/10    moderate  . Anxiety   . Colon cancer     a. 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 Tx with Xeloda.  . Stricture esophagus 06/08/10    EGD  . Gastritis   . NSTEMI (non-ST elevated myocardial infarction)     [place]  . Premature atrial contractions   . Pulmonary fibrosis     Past Surgical History  Procedure Laterality Date  . Breast lumpectomy    . Shoulder arthroscopy w/ acromial repair      bilateral  . Appendectomy    . Colon resection  05/20/2010    [facility]  . Total abdominal hysterectomy w/ bilateral salpingoophorectomy    . Cardiac catheterization  2006    40% ostial LAD, 60% mid LAD followed by 30-40% disease, 50% just after D2, 60-70% D2 lesion followed by 50% disease, normal LCx/RCA; EF 55%     Allergies  Allergies  Allergen Reactions  . Codeine Nausea And Vomiting    REACTION: Stomach cramps  . Indomethacin Other (See  Comments)    REACTION: TIA type episodes  . Penicillins Nausea And Vomiting    REACTION: vomit  . Sulfonamide Derivatives Nausea And Vomiting    REACTION: Nausea Vomiting    HPI   The patient is a 80 year old Caucasian female with past medical history of nonobstructive coronary artery disease by cath 2006, history of pulmonary fibrosis and hypertension. Her last stress test was in 2007 which showed normal EF without evidence of ischemia. Patient had episode of NSTEMI overseas in 2014. She was on flight from [place], she went to the bathroom, upon returning to her seat, she reported she could not breathe. The plane was diverted to [place] and she was admitted to a hospital for approximately one week. She  returned to [facility] to be admitted to [facility] for NSTEMI workup. Pro BNP was elevated at 1200 on arrival. Chest x-ray showed no active disease. She was placed on metoprolol, and serial troponin at the time was negative. Echocardiogram was obtained which showed preserved EF 55%, prominent lipomatous infiltration of AV groove near tricuspid valve. No further event ischemic workup was felt to be needed at the time given her advanced age. She was placed on conservative medical management for NSTEMI.   Since that time, patient has been transferred to assisted living facility. She used to walk about 2 laps  around the facility every single day until her pneumonia in early September this year. She was seen by Dr. Percival Spanish in the office on 11/04/2013 at which time, Dr. Percival Spanish reviewed the patient's previous CT. Although patient does have new onset of shortness of breath, her previous CT did not show any sign of heart failure. She was placed on nitroglycerin given her history of coronary artery disease. According to the daughter, since her last office visit, patient has been largely sedentary. According to the people at assisted nursing facility, she has not came out of  her room recently and  eat a lot of food with high salt content. She states the reason she has not done so was because she is still recovering from the recent pneumonia. She denies any recent fever, chill, cough, however endorse increasing lower extremity for the past month, and episodes of paroxysmal nocturnal dyspnea where she will wake up with shortness of breath around 6 AM in the morning. Interestingly her episodes tend to happen in the morning instead at night. The episodes have happened several times in the last several months.   She presented to [facility] in the morning of 12/09/2013 after waking up short of breath again. On arrival, her pro BNP was elevated at 4150. Troponin negative. Serum creatinine of 0.84. Hemoglobin of 10.7. Chest x-ray was consistent with vascular congestion and interstitial edema. EKG showed sinus rhythm with right bundle branch block, T wave inversion in V1, V3, leads 3 and aVF. It appears to T wave inversion has been chronic tracing back to at least 2014. Cardiology has been consulted for acute heart failure.   No current facility-administered medications on file prior to encounter.   Current Outpatient Prescriptions on File Prior to Encounter  Medication Sig Dispense Refill  . acetaminophen (TYLENOL) 325 MG tablet Take 650 mg by mouth every 6 (six) hours as needed for pain.    . AZOPT 1 % ophthalmic suspension Place 1 drop into both eyes 2 (two) times daily.     . bimatoprost (LUMIGAN) 0.01 % SOLN Place 1 drop into both eyes at bedtime.    . celecoxib (CELEBREX) 200 MG capsule Take 200 mg by mouth daily.    . citalopram (CELEXA) 10 MG tablet Take 1 tablet (10 mg total) by mouth daily. 100 tablet 3  . clopidogrel (PLAVIX) 75 MG tablet Take 1 tablet (75 mg total) by mouth daily. 100 tablet 3  . donepezil (ARICEPT) 5 MG tablet Take 1 tablet (5 mg total) by mouth at bedtime. (Patient taking differently: Take 5 mg by mouth 3 (three) times a week. Monday, Wednesday, and Friday) 100  tablet 3  . fluocinonide cream (LIDEX) 9.92 % Apply 1 application topically as needed.     . furosemide (LASIX) 20 MG tablet take 1 tablet by mouth once daily 90 tablet 3  . metoprolol tartrate (LOPRESSOR) 25 MG tablet One tablet daily in the morning 100 tablet 3  . Multiple Vitamin (MULTIVITAMIN WITH MINERALS) TABS tablet Take 1 tablet by mouth daily.    . potassium chloride (K-DUR,KLOR-CON) 10 MEQ tablet Take 1 tablet (10 mEq total) by mouth every Monday, Wednesday, and Friday. 100 tablet 3  . sodium chloride (MURO 128) 2 % ophthalmic solution Place 1 drop into the left eye.    . nitroGLYCERIN (NITROSTAT) 0.4 MG SL tablet Place 1 tablet (0.4 mg total) under the tongue every 5 (five) minutes as needed for chest pain (up to 3 doses). 25 tablet 3  Family History Family History  Problem Relation Age of Onset  . Heart disease Mother   . Colon polyps Daughter   . Colon cancer Brother     in 1s  . Breast cancer Daughter   . Kidney failure Father      Social History History   Social History  . Marital Status: Single    Spouse Name: N/A    Number of Children: N/A  . Years of Education: N/A   Occupational History  . retired    Social History Main Topics  . Smoking status: Former Smoker    Types: Cigarettes    Quit date: 01/04/1983  . Smokeless tobacco: Never Used     Comment: Quit 25-30 years ago  . Alcohol Use: 7.0 oz/week    14 drink(s) per week     Comment: a drink a night, a little scotch helps you  . Drug Use: No  . Sexual Activity: No   Other Topics Concern  . Not on file   Social History Narrative     Review of Systems  General:  No chills, fever, night sweats or weight changes.  Cardiovascular:  No chest pain, dyspnea on exertion, orthopnea, palpitations, paroxysmal nocturnal dyspnea. +LE edema Dermatological: No rash, lesions/masses Respiratory: No cough +morning dyspnea Urologic: No hematuria, dysuria Abdominal:   No nausea, vomiting, diarrhea, bright  red blood per rectum, melena, or hematemesis Neurologic:  No visual changes, wkns, changes in mental status. All other systems reviewed and are otherwise negative except as noted above.  Physical Exam  Blood pressure 174/79, pulse 70, temperature 97.6 F (36.4 C), temperature source Oral, resp. rate 20, height 5' 6.5" (1.689 m), weight 180 lb (81.647 kg), SpO2 94 %.  General: Pleasant, NAD Psych: Normal affect. Neuro: Alert and oriented X 3. Moves all extremities spontaneously. HEENT: Normal  Neck: Supple without bruits or JVD. Lungs:  Resp regular and unlabored, largely clear with bibasilar fine rale Heart: RRR no s3, s4, or murmurs. Abdomen: Soft, non-tender, non-distended, BS + x 4.  Extremities: No clubbing, cyanosis. DP/PT/Radials 2+ and equal bilaterally. 1+ edema in the legs  Labs   Recent Labs  12/09/13 1224  TROPONINI <0.30   Lab Results  Component Value Date   WBC 8.0 12/09/2013   HGB 10.7* 12/09/2013   HCT 33.4* 12/09/2013   MCV 98.5 12/09/2013   PLT 152 12/09/2013    Recent Labs Lab 12/09/13 1224  NA 137  K 4.7  CL 99  CO2 26  BUN 21  CREATININE 0.84  CALCIUM 8.7  PROT 6.1  BILITOT 0.6  ALKPHOS 64  ALT 59*  AST 40*  GLUCOSE 105*   Lab Results  Component Value Date   CHOL 137 01/21/2013   HDL 42.20 01/21/2013   LDLCALC 69 01/21/2013   TRIG 128.0 01/21/2013   Lab Results  Component Value Date   DDIMER 0.85* 10/11/2013    Radiology/Studies  Dg Chest 2 View  12/09/2013   CLINICAL DATA:  Shortness of breath and weakness  EXAM: CHEST  2 VIEW  COMPARISON:  10/11/2013  FINDINGS: Cardiac shadow remains enlarged. Chronic interstitial changes are again identified. There is a slight increase in the degree of central vascular congestion and interstitial edema consistent with mild CHF. No sizable effusion is seen. No focal confluent infiltrate is noted. No bony abnormality is noted.  IMPRESSION: Increase in vascular congestion and mild interstitial  edema.   Electronically Signed   By: Inez Catalina M.D.   On:  12/09/2013 13:47    ECG   EKG showed sinus rhythm with right bundle branch block, T wave inversion in V1, V3, leads 3 and aVF. Chronic when compare with EKG in 2014  ASSESSMENT AND PLAN  1. Acute heart failure: likely diastolic    - does have dietary indiscretion at [place]  - will repeat Echo to check LV function, no really a good candidate for invasive workup. Trend trop  - start IV lasix 40mg  daily, transition to PO lasix later. On 20mg  PO lasix at home, likely will need to increase to 40mg  PO daily on discharge  - per Dr. Radford Pax, may need ACEI    2. CAD: nonobstructive on cath 2006  - on plavix at home. Unclear why she's not on ASA, likely advanced age with bleeding concern  - on metoprolol tartrate once a day, her BP has been uncontrolled in the PM, will switch to metoprolol succinate 25mg  daily   3. hypertension 4. history of pulmonary fibrosis  Signed, Almyra Deforest, PA-C 12/09/2013, 3:12 PM

## 2013-12-09 NOTE — ED Notes (Signed)
Per EMS pt from [facility], woke up at [facility] with episode of shortness of breath that has decreased but still present . Lung sounds clear, no increased work of breathing noted. Pt. Alert and oriented x4. Pt SpO2 92%/RA placed on 2L of O2 via [place]. Pt dx with pneumonia 2 months ago and sts since then hasn't ever fully recovered.

## 2013-12-09 NOTE — Plan of Care (Signed)
Problem: Phase I Progression Outcomes Goal: Dyspnea controlled at rest (HF) Outcome: Completed/Met Date Met:  12/09/13 Goal: Pain controlled with appropriate interventions Outcome: Not Applicable Date Met:  62/83/66 Goal: Up in chair, BRP Outcome: Completed/Met Date Met:  12/09/13 Goal: Voiding-avoid urinary catheter unless indicated Outcome: Completed/Met Date Met:  12/09/13

## 2013-12-10 DIAGNOSIS — R799 Abnormal finding of blood chemistry, unspecified: Secondary | ICD-10-CM

## 2013-12-10 DIAGNOSIS — R609 Edema, unspecified: Secondary | ICD-10-CM

## 2013-12-10 DIAGNOSIS — I5040 Unspecified combined systolic (congestive) and diastolic (congestive) heart failure: Secondary | ICD-10-CM | POA: Diagnosis not present

## 2013-12-10 DIAGNOSIS — R7989 Other specified abnormal findings of blood chemistry: Secondary | ICD-10-CM | POA: Insufficient documentation

## 2013-12-10 DIAGNOSIS — I5031 Acute diastolic (congestive) heart failure: Principal | ICD-10-CM

## 2013-12-10 LAB — BASIC METABOLIC PANEL
Anion gap: 12 (ref 5–15)
BUN: 19 mg/dL (ref 6–23)
CALCIUM: 8.4 mg/dL (ref 8.4–10.5)
CO2: 31 meq/L (ref 19–32)
CREATININE: 0.89 mg/dL (ref 0.50–1.10)
Chloride: 97 mEq/L (ref 96–112)
GFR calc Af Amer: 64 mL/min — ABNORMAL LOW (ref >90)
GFR calc non Af Amer: 55 mL/min — ABNORMAL LOW (ref >90)
GLUCOSE: 97 mg/dL (ref 70–99)
Potassium: 3.5 mEq/L — ABNORMAL LOW (ref 3.7–5.3)
Sodium: 140 mEq/L (ref 137–147)

## 2013-12-10 LAB — IRON AND TIBC
Iron: 104 ug/dL (ref 42–135)
Saturation Ratios: 26 % (ref 20–55)
TIBC: 393 ug/dL (ref 250–470)
UIBC: 289 ug/dL (ref 125–400)

## 2013-12-10 LAB — FERRITIN: FERRITIN: 38 ng/mL (ref 10–291)

## 2013-12-10 LAB — VITAMIN B12: Vitamin B-12: 1314 pg/mL — ABNORMAL HIGH (ref 211–911)

## 2013-12-10 LAB — FOLATE: Folate: >20 ng/mL

## 2013-12-10 MED ORDER — FUROSEMIDE 10 MG/ML IJ SOLN
20.0000 mg | Freq: Two times a day (BID) | INTRAMUSCULAR | Status: DC
  Administered 2013-12-10: 20 mg via INTRAVENOUS
  Filled 2013-12-10 (×2): qty 2

## 2013-12-10 MED ORDER — LISINOPRIL 5 MG PO TABS
5.0000 mg | ORAL_TABLET | Freq: Every day | ORAL | Status: DC
  Administered 2013-12-10 – 2013-12-11 (×2): 5 mg via ORAL
  Filled 2013-12-10 (×2): qty 1

## 2013-12-10 NOTE — Care Management Note (Signed)
    Page 1 of 2   12/12/2013     2:50:51 PM CARE MANAGEMENT NOTE 12/12/2013  Patient:  [name], [name]   Account Number:  [id number]  Date Initiated:  12/10/2013  Documentation initiated by:  Teleah Villamar  Subjective/Objective Assessment:   Pt adm on 12/7 with acute diastolic CHF.  PTA, pt resided at [facility].     Action/Plan:   [place] consult CSW to facilitate return to ALF when medically stable.   Anticipated DC Date:  12/12/2013   Anticipated DC Plan:  ASSISTED LIVING / REST HOME  In-house referral  Clinical Social Worker      DC Forensic scientist  CM consult      [facility] Choice  HOME HEALTH   Choice offered to / List presented to:  C-1 Patient        [place] arranged  [place]   Status of service:  Completed, signed off Medicare Important Message given?  YES (If response is "NO", the following Medicare IM given date fields will be blank) Date Medicare IM given:  12/12/2013 Medicare IM given by:  Delaynie Stetzer Date Additional Medicare IM given:   Additional Medicare IM given by:    Discharge Disposition:  ASSISTED LIVING  Per UR Regulation:  Reviewed for med. necessity/level of care/duration of stay  If discussed at [place] of Stay Meetings, dates discussed:    Comments:  12/12/13 Ellan Lambert, RN, BSN [phone] Pt for discharge today, back to [facility].  Pt will need follow up HHPT/OT at this facility.  [facility] provides [facility] services at this facility, and MD need only note on dc summary which services will be needed.  Dr. Coralyn Pear to indicate this on dc summary.  Family updated on plan for continued HH follow up at facility, and they are in agreement with this plan.

## 2013-12-10 NOTE — Progress Notes (Signed)
Patient Name: [name] Date of Encounter: 12/10/2013  Primary Cardiologist: Dr. Percival Spanish   Principal Problem:   Dyspnea Active Problems:   Essential hypertension   PULMONARY FIBROSIS ILD POST INFLAMMATORY CHRONIC   Normocytic anemia   Swelling of lower extremity    SUBJECTIVE  Denies any SOB or CP. Feeling well. LE edema improving  CURRENT MEDS . brinzolamide  1 drop Both Eyes BID  . celecoxib  200 mg Oral Daily  . citalopram  10 mg Oral Daily  . clopidogrel  75 mg Oral Daily  . donepezil  5 mg Oral Once per day on Mon Wed Fri  . furosemide  20 mg Intravenous BID  . latanoprost  1 drop Both Eyes QHS  . lisinopril  2.5 mg Oral Daily  . metoprolol succinate  25 mg Oral Daily  . multivitamin with minerals  1 tablet Oral Daily  . [START ON 12/11/2013] potassium chloride  10 mEq Oral Q M,W,F  . sodium chloride  3 mL Intravenous Q12H    OBJECTIVE  Filed Vitals:   12/09/13 2020 12/09/13 2110 12/10/13 0112 12/10/13 0452  BP: 168/85 153/70 137/75 140/88  Pulse: 83 73 75 70  Temp: 97.2 F (36.2 C)   98.2 F (36.8 C)  TempSrc: Oral   Oral  Resp: 18 18 18 18   Height:      Weight:    173 lb 3.2 oz (78.563 kg)  SpO2: 91% 97% 96% 98%    Intake/Output Summary (Last 24 hours) at 12/10/13 0906 Last data filed at 12/09/13 2300  Gross per 24 hour  Intake    180 ml  Output   2075 ml  Net  -1895 ml   Filed Weights   12/09/13 1126 12/09/13 1651 12/10/13 0452  Weight: 180 lb (81.647 kg) 179 lb 3.7 oz (81.3 kg) 173 lb 3.2 oz (78.563 kg)    PHYSICAL EXAM  General: Pleasant, NAD. Neuro: Alert and oriented X 3. Moves all extremities spontaneously. Psych: Normal affect. HEENT:  Normal  Neck: Supple without bruits or JVD. Lungs:  Resp regular and unlabored, mild R basilar rale.  Heart: RRR no s3, s4, or murmurs. Abdomen: Soft, non-tender, non-distended, BS + x 4.  Extremities: No clubbing, cyanosis. DP/PT/Radials 2+ and equal bilaterally. 1+ LE edema improving.    Accessory Clinical Findings  CBC  Recent Labs  12/09/13 1224  WBC 8.0  NEUTROABS 5.7  HGB 10.7*  HCT 33.4*  MCV 98.5  PLT 287   Basic Metabolic Panel  Recent Labs  12/09/13 1224 12/10/13 0543  NA 137 140  K 4.7 3.5*  CL 99 97  CO2 26 31  GLUCOSE 105* 97  BUN 21 19  CREATININE 0.84 0.89  CALCIUM 8.7 8.4   Liver Function Tests  Recent Labs  12/09/13 1224  AST 40*  ALT 59*  ALKPHOS 64  BILITOT 0.6  PROT 6.1  ALBUMIN 3.4*   Cardiac Enzymes  Recent Labs  12/09/13 1224  TROPONINI <0.30    TELE NSR with HR 60-80s    ECG  No new EKG  Echocardiogram  pending    Radiology/Studies  Dg Chest 2 View  12/09/2013   CLINICAL DATA:  Shortness of breath and weakness  EXAM: CHEST  2 VIEW  COMPARISON:  10/11/2013  FINDINGS: Cardiac shadow remains enlarged. Chronic interstitial changes are again identified. There is a slight increase in the degree of central vascular congestion and interstitial edema consistent with mild CHF. No sizable effusion is seen.  No focal confluent infiltrate is noted. No bony abnormality is noted.  IMPRESSION: Increase in vascular congestion and mild interstitial edema.   Electronically Signed   By: Inez Catalina M.D.   On: 12/09/2013 13:47    ASSESSMENT AND PLAN  1. Acute heart failure: likely diastolic  - does have dietary indiscretion at [place] - will repeat Echo to check LV function, no really a good candidate for invasive workup. Trend trop - agree with decreasing IV lasix to 20mg  BID from 40mg  BID in this 80 yo female. Will avoid overdiuresis. Likely transition to PO lasix tomorrow  - On 20mg  PO lasix at home, likely will need to increase to 40mg  PO daily on discharge  - pending echo, unless significantly abnormal, would want to avoid invasive workup in this 80 yo female.  - increase lisinopril to 5mg  daily today, monitor BP overnight.   2. CAD:  nonobstructive on cath 2006 - on plavix at home. Unclear why she's not on ASA, likely advanced age with bleeding concern - was on metoprolol tartrate once a day at home and was hypertensive by PM, changed to Toprol XL for better coverage.   3. hypertension 4. history of pulmonary fibrosis  Weston Brass Almyra Deforest PA-C Pager: [phone]  Patient seen, examined. Available data reviewed. Agree with findings, assessment, and plan as outlined by Almyra Deforest, PA-C. Pt independently interviewed and examined. Pleasant elderly woman in NAD. Lungs with diffuse rales, CV RRR without murmur, trace edema. Another 24 hours IV diuretics today then transition to oral lasix 40 mg tomorrow. Suspect she will be ready for discharge tomorrow. Await 2D Echo.  Sherren Mocha, M.D. 12/10/2013 1:08 PM

## 2013-12-10 NOTE — Progress Notes (Signed)
*  PRELIMINARY RESULTS* Vascular Ultrasound Left lower extremity venous duplex has been completed.  Preliminary findings: no evidence of DVT.  Landry Mellow, RDMS, RVT  12/10/2013, 11:43 AM

## 2013-12-10 NOTE — Progress Notes (Signed)
[facility]                                                                              Patient Demographics  [name], is a 80 y.o. female, DOB - August 20, 1920, YKD:[id number]  Admit date - 12/09/2013   Admitting Physician Cristal Ford, DO  Outpatient Primary MD for the patient is TODD,JEFFREY [name], MD  LOS - 1   Chief Complaint  Patient presents with  . Shortness of Breath      Interim history: 80 yearold with history of pulmonary fibrosis, hypertension, coronary artery disease presented to the emergency department with complaint of shortness of breath which is chronic. Patient states she's had a long history of shortness of breath. Patient was noted to have some increased swelling in her left lower extremity, which was negative for DVT. Patient was placed on diuresis with Lasix 40 mg IV twice daily, patient has diuresed well, with over 2 L of urine output. Echocardiogram pending, cardiology consulted.  Assessment & Plan   Acute on chronic dyspnea/?Diastolic dysfunction -Improved -She has a long-standing history of shortness of breath with pulmonary fibrosis, does not use oxygen -Possible etiology of CHF versus ischemia versus extension of pulmonary fibrosis -Chest x-ray: Increasing vascular congestion and mild interstitial edema -Last echocardiogram 10/2012: EF 55% -Cardiology consulted and appreciated -Echocardiogram pending -BNP 4150 at admission -UO over the past 24 hours: 2075mL -Continue lisinopril, metoprolol -Will decrease Lasix to 20 mg IV twice a day  Left Lower extremity Swelling -Improved -Patient has had increasing swelling of LLE for the past one month -LLE doppler: negative for DVT  Hypertension -Continue metoprolol and Lasix -Patient placed on Lisinopril 2.5mg  at admission, increased to 5mg  daily by cardiology  History of coronary artery disease -Appears stable, patient has no complaints of chest pain -Catheterization in 2006 which  showed moderate nonobstructive disease -Continue home medications of Plavix, Lasix, metoprolol -Not sure why patient is not on daily aspirin, will leave to the discretion of cardiology  Normocytic Anemia -Baseline Hemoglobin 12 -Hemoglobin on admission 10.7 -Anemia panel: 104, ferritin 38 -No signs of active bleeding, no complaints of melena or hematochezia -Will continue to monitor CBC  Depression -Continue Celexa  Code Status: DO NOT RESUSCITATE  Family Communication: None at bedside  Disposition Plan: Admitted, likely discharged in the next 24-48 hours   Time Spent in minutes   30 minutes  Procedures  Left Lower extremity venous Doppler:  negative for DVT  Consults   cardiology  DVT Prophylaxis  SCDs  Lab Results  Component Value Date   PLT 152 12/09/2013    Medications  Scheduled Meds: . brinzolamide  1 drop Both Eyes BID  . celecoxib  200 mg Oral Daily  . citalopram  10 mg Oral Daily  . clopidogrel  75 mg Oral Daily  . donepezil  5 mg Oral Once per day on Mon Wed Fri  . furosemide  20 mg Intravenous BID  . latanoprost  1 drop Both Eyes QHS  . lisinopril  5 mg Oral Daily  . metoprolol succinate  25 mg Oral Daily  . multivitamin with minerals  1 tablet Oral Daily  . [START ON 12/11/2013] potassium chloride  10 mEq Oral Q M,W,F  . sodium chloride  3 mL Intravenous Q12H   Continuous Infusions:  PRN Meds:.sodium chloride, acetaminophen, nitroGLYCERIN, ondansetron (ZOFRAN) IV, sodium chloride, sodium chloride  Antibiotics    Anti-infectives    None        Subjective:   Ardyth Harps seen and examined today.  Patient states she's feeling much better today, she feels as if the swelling in her legs has decreased. She denies any shortness of breath at this time. Patient denies any abdominal pain, chest pain, dizziness, headache.   Objective:   Filed Vitals:   12/10/13 0112 12/10/13 0452 12/10/13 1120 12/10/13 1124  BP: 137/75 140/88 131/60   Pulse:  75 70  69  Temp:  98.2 F (36.8 C)    TempSrc:  Oral    Resp: 18 18    Height:      Weight:  78.563 kg (173 lb 3.2 oz)    SpO2: 96% 98%      Wt Readings from Last 3 Encounters:  12/10/13 78.563 kg (173 lb 3.2 oz)  11/04/13 83.054 kg (183 lb 1.6 oz)  10/21/13 83.008 kg (183 lb)     Intake/Output Summary (Last 24 hours) at 12/10/13 1203 Last data filed at 12/10/13 1040  Gross per 24 hour  Intake    420 ml  Output   2425 ml  Net  -2005 ml    Exam  General: Well developed, well nourished, NAD, appears stated age  HEENT: NCAT, mucous membranes moist.   Cardiovascular: S1 S2 auscultated, no rubs, murmurs or gallops. Regular rate and rhythm.  Respiratory: Clear to auscultation bilaterally with equal chest rise  Abdomen: Soft, nontender, nondistended, + bowel sounds  Extremities: warm dry without cyanosis clubbing.  Trace LE edema  Neuro: AAOx3, no focal deficits  Skin: Without rashes exudates or nodules  Psych: Normal affect and demeanor with intact judgement and insight  Data Review   Micro Results Recent Results (from the past 240 hour(s))  MRSA PCR Screening     Status: None   Collection Time: 12/09/13  7:45 PM  Result Value Ref Range Status   MRSA by PCR NEGATIVE NEGATIVE Final    Comment:        The GeneXpert MRSA Assay (FDA approved for NASAL specimens only), is one component of a comprehensive MRSA colonization surveillance program. It is not intended to diagnose MRSA infection nor to guide or monitor treatment for MRSA infections.     Radiology Reports Dg Chest 2 View  12/09/2013   CLINICAL DATA:  Shortness of breath and weakness  EXAM: CHEST  2 VIEW  COMPARISON:  10/11/2013  FINDINGS: Cardiac shadow remains enlarged. Chronic interstitial changes are again identified. There is a slight increase in the degree of central vascular congestion and interstitial edema consistent with mild CHF. No sizable effusion is seen. No focal confluent infiltrate is  noted. No bony abnormality is noted.  IMPRESSION: Increase in vascular congestion and mild interstitial edema.   Electronically Signed   By: Inez Catalina M.D.   On: 12/09/2013 13:47    CBC  Recent Labs Lab 12/09/13 1224  WBC 8.0  HGB 10.7*  HCT 33.4*  PLT 152  MCV 98.5  MCH 31.6  MCHC 32.0  RDW 14.4  LYMPHSABS 1.0  MONOABS 0.9  EOSABS 0.3  BASOSABS 0.0    Chemistries   Recent Labs Lab 12/09/13 1224 12/10/13 0543  NA 137 140  K 4.7 3.5*  CL 99 97  CO2 26 31  GLUCOSE 105* 97  BUN 21 19  CREATININE 0.84 0.89  CALCIUM 8.7 8.4  AST 40*  --   ALT 59*  --   ALKPHOS 64  --   BILITOT 0.6  --    ------------------------------------------------------------------------------------------------------------------ estimated creatinine clearance is 43.5 mL/min (by C-G formula based on Cr of 0.89). ------------------------------------------------------------------------------------------------------------------ No results for input(s): HGBA1C in the last 72 hours. ------------------------------------------------------------------------------------------------------------------ No results for input(s): CHOL, HDL, LDLCALC, TRIG, CHOLHDL, LDLDIRECT in the last 72 hours. ------------------------------------------------------------------------------------------------------------------ No results for input(s): TSH, T4TOTAL, T3FREE, THYROIDAB in the last 72 hours.  Invalid input(s): FREET3 ------------------------------------------------------------------------------------------------------------------  Recent Labs  12/09/13 1812  VITAMINB12 1314*  FOLATE >20.0  FERRITIN 38  TIBC 393  IRON 104  RETICCTPCT 1.4    Coagulation profile No results for input(s): INR, PROTIME in the last 168 hours.  No results for input(s): DDIMER in the last 72 hours.  Cardiac Enzymes  Recent Labs Lab 12/09/13 1224  TROPONINI <0.30    ------------------------------------------------------------------------------------------------------------------ Invalid input(s): POCBNP    Frederic Tones D.O. on 12/10/2013 at 12:03 PM  Between 7am to 7pm - Pager - [phone]  After 7pm go to [email or web address] - password [email or web address]  And look for the night coverage person covering for me after hours  [facility] Office  [phone]

## 2013-12-11 LAB — CBC
HCT: 33.7 % — ABNORMAL LOW (ref 36.0–46.0)
Hemoglobin: 10.9 g/dL — ABNORMAL LOW (ref 12.0–15.0)
MCH: 31.8 pg (ref 26.0–34.0)
MCHC: 32.3 g/dL (ref 30.0–36.0)
MCV: 98.3 fL (ref 78.0–100.0)
Platelets: 160 10*3/uL (ref 150–400)
RBC: 3.43 MIL/uL — ABNORMAL LOW (ref 3.87–5.11)
RDW: 14.3 % (ref 11.5–15.5)
WBC: 7.6 10*3/uL (ref 4.0–10.5)

## 2013-12-11 LAB — BASIC METABOLIC PANEL
Anion gap: 11 (ref 5–15)
BUN: 25 mg/dL — ABNORMAL HIGH (ref 6–23)
CO2: 30 mEq/L (ref 19–32)
CREATININE: 1.1 mg/dL (ref 0.50–1.10)
Calcium: 8.6 mg/dL (ref 8.4–10.5)
Chloride: 97 mEq/L (ref 96–112)
GFR calc Af Amer: 49 mL/min — ABNORMAL LOW (ref >90)
GFR calc non Af Amer: 43 mL/min — ABNORMAL LOW (ref >90)
Glucose, Bld: 98 mg/dL (ref 70–99)
Potassium: 3.5 mEq/L — ABNORMAL LOW (ref 3.7–5.3)
SODIUM: 138 meq/L (ref 137–147)

## 2013-12-11 MED ORDER — FUROSEMIDE 20 MG PO TABS
20.0000 mg | ORAL_TABLET | Freq: Every day | ORAL | Status: DC
  Filled 2013-12-11: qty 1

## 2013-12-11 MED ORDER — FUROSEMIDE 20 MG PO TABS
20.0000 mg | ORAL_TABLET | Freq: Two times a day (BID) | ORAL | Status: DC
  Filled 2013-12-11 (×4): qty 1

## 2013-12-11 NOTE — Evaluation (Signed)
Physical Therapy Evaluation Patient Details Name: [name] MRN: [id number] DOB: [birth year] Today's Date: 12/11/2013   History of Present Illness  Pt is a 80 y.o. female with a history of pulmonary fibrosis, hypertension, coronary artery disease that presented to the emergency department with complaints of shortness of breath. Patient does not have a history of heart failure however has been taking Lasix. Per daughter, patient has had increasing swelling of her left lower extremity over the past month. She denies pain with this. Per daughter, patient has not been very ambulatory or engaging since moving into the assisted living community. Per chart review there may be some attention seeking behavior. In the ED patient had chest x-ray which did show some interstitial edema.  Clinical Impression  Pt admitted with the above. Pt currently with functional limitations due to the deficits listed below (see PT Problem List). At the time of PT eval pt was able to perform transfers and ambulation with supervision. Overall strength is good and age-appropriate. Pt will benefit from skilled PT to increase their independence and safety with mobility to allow discharge to the venue listed below. Feel that pt would benefit from [place] follow-up at d/c as pt states she needs increased time to complete ADL's. Daughter states that pt has been hardly leaving her room at the ALF per staff there. OT consult would be beneficial while inpatient.      Follow Up Recommendations Home health PT;Supervision for mobility/OOB (HHOT?)    Equipment Recommendations  None recommended by PT    Recommendations for Other Services OT consult     Precautions / Restrictions Precautions Precautions: Fall Restrictions Weight Bearing Restrictions: No      Mobility  Bed Mobility Overal bed mobility: Needs Assistance Bed Mobility: Supine to Sit     Supine to sit: Supervision     General bed mobility comments:  Pt was able to transition to EOB with no physical assist. Supervision for safety and use of bed rails for support.   Transfers Overall transfer level: Needs assistance Equipment used: Rolling walker (2 wheeled) Transfers: Sit to/from Stand Sit to Stand: Supervision         General transfer comment: Pt initiating transfer to standing without PT present. Family standing next to her and powered-up to standing with family supervision.   Ambulation/Gait Ambulation/Gait assistance: Supervision Ambulation Distance (Feet): 100 Feet Assistive device: Rolling walker (2 wheeled) Gait Pattern/deviations: Step-through pattern;Decreased stride length;Trunk flexed Gait velocity: Decreased towards end with fatigue Gait velocity interpretation:  (Age-appropriate gait speed) General Gait Details: Pt usually uses a rollator to ambulate and had some difficulty maneuvering with the RW (states it is difficult to steer). No unsteadiness or LOB noted.   Stairs            Wheelchair Mobility    Modified Rankin (Stroke Patients Only)       Balance Overall balance assessment: Needs assistance Sitting-balance support: Feet supported;No upper extremity supported Sitting balance-Leahy Scale: Good     Standing balance support: Bilateral upper extremity supported Standing balance-Leahy Scale: Poor Standing balance comment: Feel that pt requires UE support to maintain standing balance.                             Pertinent Vitals/Pain Pain Assessment: No/denies pain    Home Living Family/patient expects to be discharged to:: Assisted living Living Arrangements: Alone  Home Equipment: [place] - 4 wheels      Prior Function Level of Independence: Independent with assistive device(s)               Hand Dominance   Dominant Hand: Right    Extremity/Trunk Assessment   Upper Extremity Assessment: Defer to OT evaluation;Generalized weakness            Lower Extremity Assessment: Overall WFL for tasks assessed (Age-appropriate)      Cervical / Trunk Assessment: Kyphotic  Communication   Communication: HOH (Hearing aides bilaterally)  Cognition Arousal/Alertness: Awake/alert Behavior During Therapy: WFL for tasks assessed/performed Overall Cognitive Status: Within Functional Limits for tasks assessed                      General Comments      Exercises        Assessment/Plan    PT Assessment Patient needs continued PT services  PT Diagnosis Difficulty walking   PT Problem List Decreased strength;Decreased range of motion;Decreased activity tolerance;Decreased balance;Decreased mobility;Decreased knowledge of use of DME;Decreased safety awareness;Decreased knowledge of precautions  PT Treatment Interventions DME instruction;Gait training;Stair training;Functional mobility training;Therapeutic activities;Therapeutic exercise;Neuromuscular re-education;Patient/family education   PT Goals (Current goals can be found in the Care Plan section) Acute Rehab PT Goals Patient Stated Goal: Get faster with bathing/dressing PT Goal Formulation: With patient/family Time For Goal Achievement: 12/18/13 Potential to Achieve Goals: Good    Frequency Min 3X/week   Barriers to discharge        Co-evaluation               End of Session Equipment Utilized During Treatment: Gait belt Activity Tolerance: Patient tolerated treatment well Patient left: in chair;with call bell/phone within reach;with family/visitor present Nurse Communication: Mobility status         Time: 3570-1779 PT Time Calculation (min) (ACUTE ONLY): 21 min   Charges:   PT Evaluation $Initial PT Evaluation Tier I: 1 Procedure PT Treatments $Gait Training: 8-22 mins   PT G Codes:          Rolinda Roan 12/11/2013, 4:44 PM   Rolinda Roan, PT, DPT Acute Rehabilitation Services Pager: [phone]

## 2013-12-11 NOTE — Progress Notes (Signed)
Patient Name: [name] Date of Encounter: 12/11/2013     Principal Problem:   Dyspnea Active Problems:   Essential hypertension   Coronary atherosclerosis   PULMONARY FIBROSIS ILD POST INFLAMMATORY CHRONIC   Normocytic anemia   Swelling of lower extremity   Acute diastolic CHF (congestive heart failure)   Elevated brain natriuretic peptide (BNP) level    SUBJECTIVE  Denies any SOB or CP. Some dizziness this morning when trying to get up.   CURRENT MEDS . brinzolamide  1 drop Both Eyes BID  . celecoxib  200 mg Oral Daily  . citalopram  10 mg Oral Daily  . clopidogrel  75 mg Oral Daily  . donepezil  5 mg Oral Once per day on Mon Wed Fri  . [START ON 12/12/2013] furosemide  20 mg Oral Daily  . latanoprost  1 drop Both Eyes QHS  . lisinopril  5 mg Oral Daily  . metoprolol succinate  25 mg Oral Daily  . multivitamin with minerals  1 tablet Oral Daily  . potassium chloride  10 mEq Oral Q M,W,F  . sodium chloride  3 mL Intravenous Q12H    OBJECTIVE  Filed Vitals:   12/10/13 1756 12/10/13 1928 12/10/13 2122 12/11/13 0614  BP: 105/53 120/66 122/73 126/69  Pulse: 75 78 76 77  Temp:  97.8 F (36.6 C) 97.5 F (36.4 C) 97.2 F (36.2 C)  TempSrc:  Oral Oral Oral  Resp:   18 18  Height:      Weight:    172 lb 6.8 oz (78.21 kg)  SpO2:  94% 96% 98%    Intake/Output Summary (Last 24 hours) at 12/11/13 1011 Last data filed at 12/11/13 0847  Gross per 24 hour  Intake    600 ml  Output    950 ml  Net   -350 ml   Filed Weights   12/09/13 1651 12/10/13 0452 12/11/13 0614  Weight: 179 lb 3.7 oz (81.3 kg) 173 lb 3.2 oz (78.563 kg) 172 lb 6.8 oz (78.21 kg)    PHYSICAL EXAM  General: Pleasant, NAD. Neuro: Alert and oriented X 3. Moves all extremities spontaneously. Psych: Normal affect. HEENT:  Normal  Neck: Supple without bruits or JVD. Lungs:  Resp regular and unlabored, R basilar rale Heart: RRR no s3, s4, or murmurs. Abdomen: Soft, non-tender,  non-distended, BS + x 4.  Extremities: No clubbing, cyanosis or edema. DP/PT/Radials 2+ and equal bilaterally.  Accessory Clinical Findings  CBC  Recent Labs  12/09/13 1224 12/11/13 0333  WBC 8.0 7.6  NEUTROABS 5.7  --   HGB 10.7* 10.9*  HCT 33.4* 33.7*  MCV 98.5 98.3  PLT 152 277   Basic Metabolic Panel  Recent Labs  12/10/13 0543 12/11/13 0333  NA 140 138  K 3.5* 3.5*  CL 97 97  CO2 31 30  GLUCOSE 97 98  BUN 19 25*  CREATININE 0.89 1.10  CALCIUM 8.4 8.6   Liver Function Tests  Recent Labs  12/09/13 1224  AST 40*  ALT 59*  ALKPHOS 64  BILITOT 0.6  PROT 6.1  ALBUMIN 3.4*   Cardiac Enzymes  Recent Labs  12/09/13 1224  TROPONINI <0.30    TELE NSR with HR 60-70s    ECG  No new EKG  Echocardiogram  pending    Radiology/Studies  Dg Chest 2 View  12/09/2013   CLINICAL DATA:  Shortness of breath and weakness  EXAM: CHEST  2 VIEW  COMPARISON:  10/11/2013  FINDINGS:  Cardiac shadow remains enlarged. Chronic interstitial changes are again identified. There is a slight increase in the degree of central vascular congestion and interstitial edema consistent with mild CHF. No sizable effusion is seen. No focal confluent infiltrate is noted. No bony abnormality is noted.  IMPRESSION: Increase in vascular congestion and mild interstitial edema.   Electronically Signed   By: Inez Catalina M.D.   On: 12/09/2013 13:47    ASSESSMENT AND PLAN  1. Acute heart failure: likely diastolic  - does have dietary indiscretion at [place] - neg trop, pending echo - On 20mg  PO lasix at home, s/p IV diuresisnet -2.2L, weight down 8 lbs  - pending echo, unless significantly abnormal, would want to avoid invasive workup in this 80 yo female.  - lisinopril increased yesterday, BP controlled. Episode of dizziness this morning, SCr trended up, possibly related to diuresis. Still has some rale in R  base, h/o pulm fibrosis, possibly atelectasis. D/C lasix to avoid overdiuresis, start 40mg  PO lasix tomorrow before discharge. Monitor for hypotensive episode, if so, will decrease lisinopril back down to 2.5mg   - possibly discharge tomorrow after starting on 40mg  lasix, BMET in 7 days after discharge  2. CAD: nonobstructive on cath 2006 - on plavix at home. Unclear why she's not on ASA, likely advanced age with bleeding concern - was on metoprolol tartrate once a day at home and was hypertensive by PM, changed to Toprol XL for better coverage.   3. hypertension 4. history of pulmonary fibrosis  Weston Brass Almyra Deforest PA-C Pager: [phone]  Patient seen, examined. Available data reviewed. Agree with findings, assessment, and plan as outlined by Almyra Deforest, PA-C. The patient does not feel well today. She complains of dizziness and weakness. States she feels drunk. She her breathing is improved and she has no chest pain. Exam reveals a pleasant, elderly woman in no distress. Lungs are clear. Heart is regular rate and rhythm. There is no peripheral edema. The patient is clinically improved with respect to her heart failure. However, she may be a bit over-diuresed. Also could be reacting to low-dose lisinopril. I would favor stopping lisinopril. Will plan on restarting furosemide tomorrow morning. Await 2-D echocardiogram. Hopefully she will be ready for discharge home in the morning as she is otherwise doing well.  Sherren Mocha, M.D. 12/11/2013 1:45 PM

## 2013-12-11 NOTE — Progress Notes (Signed)
[facility] PROGRESS NOTE  [name] ESP:[id number] DOB: 09-Nov-1920 DOA: 12/09/2013 PCP: Joycelyn Man, MD  Assessment/Plan: 1. Suspected acute on chronic diastolic congestive heart failure -Pending transthoracic echocardiogram, last transthoracic echocardiogram was done on 10/03/2012 that showed preserved ejection fraction of 55% -Patient complaining of dizziness particularly when sitting up, will discontinue IV Lasix as I think she may be getting dehydrated. Labs showing an upward trend in BUN and creatinine. Will restart her on oral Lasix tomorrow morning -She has a net negative fluid balance of 2.3 L -Case discussed with cardiology, will hold lisinopril  2.  Hypertension. -Patient complaining of dizziness, occasionally with sitting up. Stopping IV Lasix as well as lisinopril. Continue metoprolol 25 mg daily -Patient's systolic blood pressures maintaining in the 120s  3.  History of coronary artery disease -Status post cardiac catheterization in 2006 which showed nonobstructive coronary artery disease -She currently denies chest pain, continue Plavix 75 mg by mouth daily, beta blocker therapy and plan to restart oral diuresis in a.m.   Code Status: DNR Family Communication:  Disposition Plan: Anticipate discharge in the next 24-48 hours   Consultants:  Cardiology   HPI/Subjective: Patient is a pleasant 80 year old female with a past medical history of diastolic congestive heart failure with preserved ejection fraction, having last transthoracic echocardiogram on 10/03/2012 showing LVEF of 55%, admitted to the medical service on 12/09/2013 when she presented with shortness of breath. Symptoms attributed to acute decompensated congestive heart failure as she was initially diuresed with IV Lasix 40 mg twice a day. Cardiology was consulted.  Objective: Filed Vitals:   12/11/13 1107  BP:   Pulse: 70  Temp:   Resp:     Intake/Output Summary (Last 24 hours) at  12/11/13 1545 Last data filed at 12/11/13 1124  Gross per 24 hour  Intake    600 ml  Output    900 ml  Net   -300 ml   Filed Weights   12/09/13 1651 12/10/13 0452 12/11/13 0614  Weight: 81.3 kg (179 lb 3.7 oz) 78.563 kg (173 lb 3.2 oz) 78.21 kg (172 lb 6.8 oz)    Exam:   General:  Patient complaining of dizziness/lightheadedness  Cardiovascular: Regular rate and rhythm normal S1-S2  Respiratory: Lungs are clear to auscultation bilaterally  Abdomen: Soft nontender nondistended  Musculoskeletal: No edema  Data Reviewed: Basic Metabolic Panel:  Recent Labs Lab 12/09/13 1224 12/10/13 0543 12/11/13 0333  NA 137 140 138  K 4.7 3.5* 3.5*  CL 99 97 97  CO2 26 31 30   GLUCOSE 105* 97 98  BUN 21 19 25*  CREATININE 0.84 0.89 1.10  CALCIUM 8.7 8.4 8.6   Liver Function Tests:  Recent Labs Lab 12/09/13 1224  AST 40*  ALT 59*  ALKPHOS 64  BILITOT 0.6  PROT 6.1  ALBUMIN 3.4*   No results for input(s): LIPASE, AMYLASE in the last 168 hours. No results for input(s): AMMONIA in the last 168 hours. CBC:  Recent Labs Lab 12/09/13 1224 12/11/13 0333  WBC 8.0 7.6  NEUTROABS 5.7  --   HGB 10.7* 10.9*  HCT 33.4* 33.7*  MCV 98.5 98.3  PLT 152 160   Cardiac Enzymes:  Recent Labs Lab 12/09/13 1224  TROPONINI <0.30   BNP (last 3 results)  Recent Labs  10/11/13 1406 12/09/13 1224  PROBNP 1397.0* 4150.0*   CBG: No results for input(s): GLUCAP in the last 168 hours.  Recent Results (from the past 240 hour(s))  MRSA PCR Screening  Status: None   Collection Time: 12/09/13  7:45 PM  Result Value Ref Range Status   MRSA by PCR NEGATIVE NEGATIVE Final    Comment:        The GeneXpert MRSA Assay (FDA approved for NASAL specimens only), is one component of a comprehensive MRSA colonization surveillance program. It is not intended to diagnose MRSA infection nor to guide or monitor treatment for MRSA infections.      Studies: No results  found.  Scheduled Meds: . brinzolamide  1 drop Both Eyes BID  . celecoxib  200 mg Oral Daily  . citalopram  10 mg Oral Daily  . clopidogrel  75 mg Oral Daily  . donepezil  5 mg Oral Once per day on Mon Wed Fri  . [START ON 12/12/2013] furosemide  20 mg Oral Daily  . latanoprost  1 drop Both Eyes QHS  . metoprolol succinate  25 mg Oral Daily  . multivitamin with minerals  1 tablet Oral Daily  . potassium chloride  10 mEq Oral Q M,W,F  . sodium chloride  3 mL Intravenous Q12H   Continuous Infusions:   Principal Problem:   Dyspnea Active Problems:   Essential hypertension   Coronary atherosclerosis   PULMONARY FIBROSIS ILD POST INFLAMMATORY CHRONIC   Normocytic anemia   Swelling of lower extremity   Acute diastolic CHF (congestive heart failure)   Elevated brain natriuretic peptide (BNP) level    Time spent: 25 min    Kelvin Cellar  [facility] Pager [phone]. If 7PM-7AM, please contact night-coverage at [email or web address], password [facility] 12/11/2013, 3:45 PM  LOS: 2 days

## 2013-12-11 NOTE — Progress Notes (Signed)
At 0905 pt c/o of feeling dizzy.  Still up in chair at start of shift.  Bp 117/48. P72.  )2 96% RA.  Assisted pt back to bed.  Bed alarm on and instructed her not to get up w/o assist and to use call bell.  Pt's daughter at bedside.  Verbalized understanding.  Dr.  Coralyn Pear made aware.  Will continue to monitor.  Karie Kirks, Therapist, sports.

## 2013-12-11 NOTE — Plan of Care (Signed)
Problem: Phase III Progression Outcomes Goal: Tolerating diet Outcome: Completed/Met Date Met:  12/11/13

## 2013-12-12 DIAGNOSIS — I369 Nonrheumatic tricuspid valve disorder, unspecified: Secondary | ICD-10-CM

## 2013-12-12 DIAGNOSIS — R5381 Other malaise: Secondary | ICD-10-CM

## 2013-12-12 LAB — BASIC METABOLIC PANEL
Anion gap: 8 (ref 5–15)
BUN: 20 mg/dL (ref 6–23)
CO2: 31 mEq/L (ref 19–32)
CREATININE: 0.82 mg/dL (ref 0.50–1.10)
Calcium: 8.4 mg/dL (ref 8.4–10.5)
Chloride: 99 mEq/L (ref 96–112)
GFR, EST AFRICAN AMERICAN: 70 mL/min — AB (ref >90)
GFR, EST NON AFRICAN AMERICAN: 61 mL/min — AB (ref >90)
Glucose, Bld: 91 mg/dL (ref 70–99)
POTASSIUM: 3.9 meq/L (ref 3.7–5.3)
Sodium: 138 mEq/L (ref 137–147)

## 2013-12-12 MED ORDER — POTASSIUM CHLORIDE CRYS ER 20 MEQ PO TBCR: 40.0000 meq | EXTENDED_RELEASE_TABLET | Freq: Every day | ORAL | Status: DC

## 2013-12-12 MED ORDER — METOPROLOL SUCCINATE ER 25 MG PO TB24: 25.0000 mg | ORAL_TABLET | Freq: Every day | ORAL | Status: DC

## 2013-12-12 MED ORDER — FUROSEMIDE 40 MG PO TABS
40.0000 mg | ORAL_TABLET | Freq: Every day | ORAL | Status: DC
  Administered 2013-12-12: 40 mg via ORAL
  Filled 2013-12-12 (×2): qty 1

## 2013-12-12 MED ORDER — FUROSEMIDE 40 MG PO TABS: 40.0000 mg | ORAL_TABLET | Freq: Every day | ORAL | Status: DC

## 2013-12-12 NOTE — Progress Notes (Signed)
Cardiac monitor discontinued, CCMD notified. Patient being discharged to [facility]. Discharge instructions discussed with daughter, who is at bedside. New medications, follow up appt, diet and activity discussed. S/S of Heart Failure discussed. Patient being taken to [facility] via daughter when discharge paperwork complete.

## 2013-12-12 NOTE — Progress Notes (Signed)
  Echocardiogram 2D Echocardiogram has been performed.  [name] 12/12/2013, 10:16 AM

## 2013-12-12 NOTE — Discharge Summary (Signed)
Physician Discharge Summary  [name] [id number] DOB: 1920-01-24 DOA: 12/09/2013  PCP: Joycelyn Man, MD  Admit date: 12/09/2013 Discharge date: 12/12/2013  Time spent: 35 minutes  Recommendations for Outpatient Follow-up:  1. Patient was set up with [facility] for home PT, RN and Aide 2. Please obtain a BMP in 3-5 days as her lasix dose was increased to 40 mg PO q daily 3. Please monitor blood pressures given increase in Lasix dose.   Discharge Diagnoses:  Principal Problem:   Dyspnea Active Problems:   Essential hypertension   Coronary atherosclerosis   PULMONARY FIBROSIS ILD POST INFLAMMATORY CHRONIC   Normocytic anemia   Swelling of lower extremity   Acute diastolic CHF (congestive heart failure)   Elevated brain natriuretic peptide (BNP) level   Physical deconditioning   Discharge Condition: Stable  Diet recommendation: Heart Healthy  Filed Weights   12/10/13 0452 12/11/13 0614 12/12/13 0540  Weight: 78.563 kg (173 lb 3.2 oz) 78.21 kg (172 lb 6.8 oz) 80.6 kg (177 lb 11.1 oz)    History of present illness:  [name] is a 80 y.o. female  With a history of pulmonary fibrosis, hypertension, coronary artery disease that presented to the emergency department with complaints of shortness of breath. She has had a long-standing history of shortness of breath and has been seeing Dr. Percival Spanish, cardiologist, and Dr. Sherren Mocha, PCP. Patient does not have a history of heart failure however has been taking Lasix. Patient states she had some shortness of breath this morning however it's no different than other days. Per daughter, patient has had increasing swelling of her left lower extremity over the past month. She denies pain with this. Per daughter, patient has not been very ambulatory or engaging since moving into the assisted living community. There may be some attention seeking behavior. In the emergency department, patient had chest x-ray which did  show some interstitial edema, BNP was also elevated. [facility] was asked to admit.  Hospital Course:  Patient is a pleasant 80 year old female with a past medical history of diastolic congestive heart failure with preserved ejection fraction, having last transthoracic echocardiogram on 10/03/2012 showing LVEF of 55%, admitted to the medical service on 12/09/2013 when she presented with shortness of breath. Symptoms attributed to acute decompensated congestive heart failure as she was initially diuresed with IV Lasix 40 mg twice a day. She was seen by Cardiology during this hospitalization. On 12/12/2013 she complained of dizziness as labs showed an increasing creatinine and BUN. There was concern for over diuresis as her lasix was held. By 12/12/2013 her weight decreased by 8 lbs having a net negative fluid balance of 2.2 liters. She was ambulated down the hallway and required some assistance with unsteady gait. Prior to discharge [facility] was set up for home PT, RN, Aide.     Consultations:  Cardiology  Discharge Exam: Filed Vitals:   12/12/13 0945  BP: 117/61  Pulse: 74  Temp: 97.3 F (36.3 C)  Resp: 18    General: Patient ambulated down the hallway  Cardiovascular: Regular rate and rhythm normal S1-S2  Respiratory: Lungs are clear to auscultation bilaterally  Abdomen: Soft nontender nondistended  Musculoskeletal: No edema  Discharge Instructions You were cared for by a hospitalist during your hospital stay. If you have any questions about your discharge medications or the care you received while you were in the hospital after you are discharged, you can call the unit and asked to speak with the hospitalist  on call if the hospitalist that took care of you is not available. Once you are discharged, your primary care physician will handle any further medical issues. Please note that NO REFILLS for any discharge medications will be authorized once you are discharged, as it is  imperative that you return to your primary care physician (or establish a relationship with a primary care physician if you do not have one) for your aftercare needs so that they can reassess your need for medications and monitor your lab values.  Discharge Instructions    (HEART FAILURE PATIENTS) Call MD:  Anytime you have any of the following symptoms: 1) 3 pound weight gain in 24 hours or 5 pounds in 1 week 2) shortness of breath, with or without a dry hacking cough 3) swelling in the hands, feet or stomach 4) if you have to sleep on extra pillows at night in order to breathe.    Complete by:  As directed      Call MD for:  difficulty breathing, headache or visual disturbances    Complete by:  As directed      Call MD for:  extreme fatigue    Complete by:  As directed      Call MD for:  hives    Complete by:  As directed      Call MD for:  persistant dizziness or light-headedness    Complete by:  As directed      Call MD for:  persistant nausea and vomiting    Complete by:  As directed      Call MD for:  redness, tenderness, or signs of infection (pain, swelling, redness, odor or green/yellow discharge around incision site)    Complete by:  As directed      Call MD for:  severe uncontrolled pain    Complete by:  As directed      Call MD for:  temperature >100.4    Complete by:  As directed      Diet - low sodium heart healthy    Complete by:  As directed      Increase activity slowly    Complete by:  As directed           Current Discharge Medication List    START taking these medications   Details  metoprolol succinate (TOPROL-XL) 25 MG 24 hr tablet Take 1 tablet (25 mg total) by mouth daily. Qty: 30 tablet, Refills: 1      CONTINUE these medications which have CHANGED   Details  furosemide (LASIX) 40 MG tablet Take 1 tablet (40 mg total) by mouth daily. Qty: 30 tablet, Refills: 1    potassium chloride SA (K-DUR,KLOR-CON) 20 MEQ tablet Take 2 tablets (40 mEq total) by  mouth daily. Qty: 20 tablet, Refills: 1      CONTINUE these medications which have NOT CHANGED   Details  acetaminophen (TYLENOL) 325 MG tablet Take 650 mg by mouth every 6 (six) hours as needed for pain.    AZOPT 1 % ophthalmic suspension Place 1 drop into both eyes 2 (two) times daily.    Associated Diagnoses: Malignant neoplasm of colon, unspecified site; Colon cancer    bimatoprost (LUMIGAN) 0.01 % SOLN Place 1 drop into both eyes at bedtime.    celecoxib (CELEBREX) 200 MG capsule Take 200 mg by mouth daily.    citalopram (CELEXA) 10 MG tablet Take 1 tablet (10 mg total) by mouth daily. Qty: 100 tablet, Refills: 3   Associated  Diagnoses: Depression, involutional    clopidogrel (PLAVIX) 75 MG tablet Take 1 tablet (75 mg total) by mouth daily. Qty: 100 tablet, Refills: 3   Associated Diagnoses: Postinflammatory pulmonary fibrosis    donepezil (ARICEPT) 5 MG tablet Take 1 tablet (5 mg total) by mouth at bedtime. Qty: 100 tablet, Refills: 3   Associated Diagnoses: Short-term memory loss    fluocinonide cream (LIDEX) 2.70 % Apply 1 application topically as needed.     Multiple Vitamin (MULTIVITAMIN WITH MINERALS) TABS tablet Take 1 tablet by mouth daily.    sodium chloride (MURO 128) 2 % ophthalmic solution Place 1 drop into the left eye.    nitroGLYCERIN (NITROSTAT) 0.4 MG SL tablet Place 1 tablet (0.4 mg total) under the tongue every 5 (five) minutes as needed for chest pain (up to 3 doses). Qty: 25 tablet, Refills: 3      STOP taking these medications     metoprolol tartrate (LOPRESSOR) 25 MG tablet        Allergies  Allergen Reactions  . Codeine Nausea And Vomiting    REACTION: Stomach cramps  . Indomethacin Other (See Comments)    REACTION: TIA type episodes  . Penicillins Nausea And Vomiting    REACTION: vomit  . Sulfonamide Derivatives Nausea And Vomiting    REACTION: Nausea Vomiting   Follow-up Information    Follow up with Minus Breeding, MD.    Specialty:  Cardiology   Why:  Office will contact you to arrange appt   Contact information:   [address] [place] [address] [phone]       Please follow up.   Why:  please obtain Basic Metabolic Panel in 1 wk and have result sent to Dr. Rosezella [place] office       The results of significant diagnostics from this hospitalization (including imaging, microbiology, ancillary and laboratory) are listed below for reference.    Significant Diagnostic Studies: Dg Chest 2 View  12/09/2013   CLINICAL DATA:  Shortness of breath and weakness  EXAM: CHEST  2 VIEW  COMPARISON:  10/11/2013  FINDINGS: Cardiac shadow remains enlarged. Chronic interstitial changes are again identified. There is a slight increase in the degree of central vascular congestion and interstitial edema consistent with mild CHF. No sizable effusion is seen. No focal confluent infiltrate is noted. No bony abnormality is noted.  IMPRESSION: Increase in vascular congestion and mild interstitial edema.   Electronically Signed   By: Inez Catalina M.D.   On: 12/09/2013 13:47    Microbiology: Recent Results (from the past 240 hour(s))  MRSA PCR Screening     Status: None   Collection Time: 12/09/13  7:45 PM  Result Value Ref Range Status   MRSA by PCR NEGATIVE NEGATIVE Final    Comment:        The GeneXpert MRSA Assay (FDA approved for NASAL specimens only), is one component of a comprehensive MRSA colonization surveillance program. It is not intended to diagnose MRSA infection nor to guide or monitor treatment for MRSA infections.      Labs: Basic Metabolic Panel:  Recent Labs Lab 12/09/13 1224 12/10/13 0543 12/11/13 0333 12/12/13 0639  NA 137 140 138 138  K 4.7 3.5* 3.5* 3.9  CL 99 97 97 99  CO2 26 31 30 31   GLUCOSE 105* 97 98 91  BUN 21 19 25* 20  CREATININE 0.84 0.89 1.10 0.82  CALCIUM 8.7 8.4 8.6 8.4   Liver Function Tests:  Recent Labs Lab 12/09/13  1224  AST 40*  ALT 59*   ALKPHOS 64  BILITOT 0.6  PROT 6.1  ALBUMIN 3.4*   No results for input(s): LIPASE, AMYLASE in the last 168 hours. No results for input(s): AMMONIA in the last 168 hours. CBC:  Recent Labs Lab 12/09/13 1224 12/11/13 0333  WBC 8.0 7.6  NEUTROABS 5.7  --   HGB 10.7* 10.9*  HCT 33.4* 33.7*  MCV 98.5 98.3  PLT 152 160   Cardiac Enzymes:  Recent Labs Lab 12/09/13 1224  TROPONINI <0.30   BNP: BNP (last 3 results)  Recent Labs  10/11/13 1406 12/09/13 1224  PROBNP 1397.0* 4150.0*   CBG: No results for input(s): GLUCAP in the last 168 hours.     SignedKelvin Cellar  [facility] 12/12/2013, 11:43 AM

## 2013-12-12 NOTE — Discharge Instructions (Signed)
Avoid high salt diet. Keep daily fluid intake to between 32 to 64 oz or < 2L. Obtain daily morning weight, contact cardiology if weight increase by more than 3 lbs overnight or 5 lbs in a single week.

## 2013-12-12 NOTE — Progress Notes (Addendum)
Patient taken out of hospital for discharge via wheelchair.  

## 2013-12-12 NOTE — Progress Notes (Signed)
Patient Name: [name] Date of Encounter: 12/12/2013     Principal Problem:   Dyspnea Active Problems:   Essential hypertension   Coronary atherosclerosis   PULMONARY FIBROSIS ILD POST INFLAMMATORY CHRONIC   Normocytic anemia   Swelling of lower extremity   Acute diastolic CHF (congestive heart failure)   Elevated brain natriuretic peptide (BNP) level    SUBJECTIVE  Denies any SOB or CP, still feel weak but willing to go home. Plan on Fly to [place] on 12/23rd to visit son and back on 12/30th.   CURRENT MEDS . brinzolamide  1 drop Both Eyes BID  . celecoxib  200 mg Oral Daily  . citalopram  10 mg Oral Daily  . clopidogrel  75 mg Oral Daily  . donepezil  5 mg Oral Once per day on Mon Wed Fri  . furosemide  40 mg Oral Daily  . latanoprost  1 drop Both Eyes QHS  . metoprolol succinate  25 mg Oral Daily  . multivitamin with minerals  1 tablet Oral Daily  . potassium chloride  10 mEq Oral Q M,W,F  . sodium chloride  3 mL Intravenous Q12H    OBJECTIVE  Filed Vitals:   12/11/13 2010 12/12/13 0540 12/12/13 0545 12/12/13 0945  BP: 117/60 137/49 141/68 117/61  Pulse: 76 73  74  Temp: 98 F (36.7 C) 98.1 F (36.7 C)  97.3 F (36.3 C)  TempSrc: Oral Oral  Oral  Resp: 18 17  18   Height:      Weight:  177 lb 11.1 oz (80.6 kg)    SpO2: 98% 94%  94%    Intake/Output Summary (Last 24 hours) at 12/12/13 1114 Last data filed at 12/12/13 0901  Gross per 24 hour  Intake    720 ml  Output   1350 ml  Net   -630 ml   Filed Weights   12/10/13 0452 12/11/13 0614 12/12/13 0540  Weight: 173 lb 3.2 oz (78.563 kg) 172 lb 6.8 oz (78.21 kg) 177 lb 11.1 oz (80.6 kg)    PHYSICAL EXAM  General: Pleasant, NAD. Neuro: Alert and oriented X 3. Moves all extremities spontaneously. Psych: Normal affect. HEENT:  Normal  Neck: Supple without bruits or JVD. Lungs:  Resp regular and unlabored, CTA, fine rale near base Heart: RRR no s3, s4, or murmurs. Abdomen: Soft,  non-tender, non-distended, BS + x 4.  Extremities: No clubbing, cyanosis or edema. DP/PT/Radials 2+ and equal bilaterally.  Accessory Clinical Findings  CBC  Recent Labs  12/09/13 1224 12/11/13 0333  WBC 8.0 7.6  NEUTROABS 5.7  --   HGB 10.7* 10.9*  HCT 33.4* 33.7*  MCV 98.5 98.3  PLT 152 349   Basic Metabolic Panel  Recent Labs  12/11/13 0333 12/12/13 0639  NA 138 138  K 3.5* 3.9  CL 97 99  CO2 30 31  GLUCOSE 98 91  BUN 25* 20  CREATININE 1.10 0.82  CALCIUM 8.6 8.4   Liver Function Tests  Recent Labs  12/09/13 1224  AST 40*  ALT 59*  ALKPHOS 64  BILITOT 0.6  PROT 6.1  ALBUMIN 3.4*   Cardiac Enzymes  Recent Labs  12/09/13 1224  TROPONINI <0.30    TELE NSR with HR 70s    ECG  No new EKG  Echocardiogram  pending    Radiology/Studies  Dg Chest 2 View  12/09/2013   CLINICAL DATA:  Shortness of breath and weakness  EXAM: CHEST  2 VIEW  COMPARISON:  10/11/2013  FINDINGS: Cardiac shadow remains enlarged. Chronic interstitial changes are again identified. There is a slight increase in the degree of central vascular congestion and interstitial edema consistent with mild CHF. No sizable effusion is seen. No focal confluent infiltrate is noted. No bony abnormality is noted.  IMPRESSION: Increase in vascular congestion and mild interstitial edema.   Electronically Signed   By: Inez Catalina M.D.   On: 12/09/2013 13:47    ASSESSMENT AND PLAN  1. Acute heart failure: likely diastolic  - does have dietary indiscretion at [facility] - neg trop, echo done this morning, currently pending - On 20mg  PO lasix at home, s/p IV diuresisnet -2.2L, weight down 8 lbs  - pending echo, unless significantly abnormal, would want to avoid invasive workup in this 80 yo female.  - still has bibasilar rale, however likely atelectasis given advanced age, will set up home health.   - lisinopril  d/c'ed due to hypotension, continue on lasix 40mg  daily, increase KCl to daily upon discharge  - will arrange BMET in 1 wk, and will arrange followup after patient return from [place] on 12/30  - repeated emphasis has been placed on Na restriction and fluid restriction to less than 2 litter per day. Discussed importance of daily weight. Patient will contact cardiology if her weight increase by more than 3 lbs overnight or 5 lbs in a single day. She will take additional lasix if has SOB or significant weight gain  2. CAD: nonobstructive on cath 2006 - on plavix at home. Unclear why she's not on ASA, likely advanced age with bleeding concern - was on metoprolol tartrate once a day at home and was hypertensive by PM, changed to Toprol XL for better coverage.   3. hypertension 4. history of pulmonary fibrosis  Signed, Almyra Deforest PA-C Pager: [phone]

## 2013-12-16 NOTE — Clinical Social Work Psychosocial (Addendum)
    Clinical Social Work Department BRIEF PSYCHOSOCIAL ASSESSMENT 12/12/2013  Patient:  [name], [name]     Account Number:  [id number]     Admit date:  12/09/2013  Clinical Social Worker:  Elam Dutch  Date/Time:  12/12/2013 01:25 PM  Referred by:  Physician  Date Referred:  12/12/2013 Referred for  Other - See comment   Other Referral:   Return to the ALF   Interview type:  Other - See comment Other interview type:   Patient and daughter [name]    PSYCHOSOCIAL DATA Living Status:  FACILITY Admitted from facility:  [facility] Level of care:  Assisted Living Primary support name:  Virgina Evener   [phone] Primary support relationship to patient:  CHILD, ADULT Degree of support available:   Strong support    CURRENT CONCERNS Current Concerns  Other - See comment   Other Concerns:   Return to [place] / PLAN 80 year old female- resident of [facility].  Per MD- patient is stable for return to facility today. Discussed with patient and daughter [name] and they request return to facility.  Physical Therapy has evaluated patient and recommend HHPT. CSW spoke to [facility]- at [facility]. She has reviewed the Fl2 and d/c summary and gave ok for return to facility.  Fl2 signed by MD.  Daughter to transport via car.   Assessment/plan status:  No Further Intervention Required Other assessment/ plan:   Information/referral to community resources:   [place] arranged by [facility]    PATIENT'S/FAMILY'S RESPONSE TO PLAN OF CARE: Patient is alert and oriented; very pleasant lady who states that she loves living at [facility] and is anxious to return there.  When told of d/c back to ALF- she was noted to be smiling and animated.  "I"m ready."  Daughter [name] is aware and pleased with dc plan back to the facility. She will transport via car. Nursing notified of d/c plan. CSW signing off.

## 2013-12-19 ENCOUNTER — Encounter: Payer: Self-pay | Admitting: Cardiology

## 2013-12-24 NOTE — Progress Notes (Signed)
Pt called no answer and no able to leave message

## 2014-01-09 ENCOUNTER — Other Ambulatory Visit: Payer: Self-pay | Admitting: Family Medicine

## 2014-01-20 ENCOUNTER — Encounter: Payer: Self-pay | Admitting: Family Medicine

## 2014-01-20 ENCOUNTER — Ambulatory Visit (INDEPENDENT_AMBULATORY_CARE_PROVIDER_SITE_OTHER): Payer: Medicare Other | Admitting: Family Medicine

## 2014-01-20 VITALS — BP 130/70 | HR 74 | Temp 97.4°F | Wt 188.0 lb

## 2014-01-20 DIAGNOSIS — R7989 Other specified abnormal findings of blood chemistry: Secondary | ICD-10-CM

## 2014-01-20 DIAGNOSIS — L03116 Cellulitis of left lower limb: Secondary | ICD-10-CM

## 2014-01-20 DIAGNOSIS — R799 Abnormal finding of blood chemistry, unspecified: Secondary | ICD-10-CM

## 2014-01-20 DIAGNOSIS — I5033 Acute on chronic diastolic (congestive) heart failure: Secondary | ICD-10-CM

## 2014-01-20 DIAGNOSIS — I10 Essential (primary) hypertension: Secondary | ICD-10-CM

## 2014-01-20 DIAGNOSIS — R600 Localized edema: Secondary | ICD-10-CM

## 2014-01-20 LAB — CBC WITH DIFFERENTIAL/PLATELET
Basophils Absolute: 0.1 10*3/uL (ref 0.0–0.1)
Basophils Relative: 0.6 % (ref 0.0–3.0)
Eosinophils Absolute: 0.2 10*3/uL (ref 0.0–0.7)
Eosinophils Relative: 2.1 % (ref 0.0–5.0)
HCT: 34.6 % — ABNORMAL LOW (ref 36.0–46.0)
Hemoglobin: 11.1 g/dL — ABNORMAL LOW (ref 12.0–15.0)
LYMPHS PCT: 15.7 % (ref 12.0–46.0)
Lymphs Abs: 1.6 10*3/uL (ref 0.7–4.0)
MCHC: 32 g/dL (ref 30.0–36.0)
MCV: 97.1 fl (ref 78.0–100.0)
MONOS PCT: 12 % (ref 3.0–12.0)
Monocytes Absolute: 1.2 10*3/uL — ABNORMAL HIGH (ref 0.1–1.0)
NEUTROS ABS: 7.1 10*3/uL (ref 1.4–7.7)
Neutrophils Relative %: 69.6 % (ref 43.0–77.0)
PLATELETS: 171 10*3/uL (ref 150.0–400.0)
RBC: 3.57 Mil/uL — AB (ref 3.87–5.11)
RDW: 15.7 % — ABNORMAL HIGH (ref 11.5–15.5)
WBC: 10.2 10*3/uL (ref 4.0–10.5)

## 2014-01-20 LAB — BASIC METABOLIC PANEL
BUN: 26 mg/dL — ABNORMAL HIGH (ref 6–23)
CHLORIDE: 101 meq/L (ref 96–112)
CO2: 30 meq/L (ref 19–32)
CREATININE: 0.92 mg/dL (ref 0.40–1.20)
Calcium: 8.8 mg/dL (ref 8.4–10.5)
GFR: 60.79 mL/min (ref >60.00)
Glucose, Bld: 106 mg/dL — ABNORMAL HIGH (ref 70–99)
Potassium: 5 mEq/L (ref 3.5–5.1)
Sodium: 135 mEq/L (ref 135–145)

## 2014-01-20 LAB — BRAIN NATRIURETIC PEPTIDE: PRO B NATRI PEPTIDE: 869 pg/mL — AB (ref 0.0–100.0)

## 2014-01-20 MED ORDER — CEPHALEXIN 500 MG PO CAPS: 500.0000 mg | ORAL_CAPSULE | Freq: Three times a day (TID) | ORAL | Status: DC

## 2014-01-20 NOTE — Progress Notes (Signed)
Pre visit review using our clinic review tool, if applicable. No additional management support is needed unless otherwise documented below in the visit note. 

## 2014-01-20 NOTE — Progress Notes (Signed)
Subjective:    Patient ID: [name], female    DOB: [birth year], 81 y.o.   MRN: [id number]  HPI Patient seen as a work in with some progressive left leg swelling redness warmth over the past 5-6 days. She has history of diastolic dysfunction and had recent admission back in December for increased bilateral leg edema. Daughter states that her left leg is chronically larger the right. She had recent increase in furosemide from 20-40 mg daily. She particularly noticed increased swelling of the left leg on Saturday and elevated this frequent Sunday and states this is improved compared to the weekend. She has not had any fever or chills. She is not doing daily weights. She is taking furosemide 40 mg daily consistently. She had electrolyte panel December 17 which was stable. Recent pro BNP level over 4000 with preserved ejection fraction.  Past Medical History  Diagnosis Date  . Breast cancer     lumpectomy followed by RT and Tamoxifen x 5 years  . Hypertension   . CAD (coronary artery disease)     a. Mod nonobstructive by cath 2006. b. stress test 2007- no evidence of ischemia; EF 58%  . Dermatitis   . Female stress incontinence   . Seborrheic keratosis, inflamed   . Pulmonary fibrosis   . Hearing loss   . Erosive esophagitis 05/19/10  . Hiatal hernia 05/19/10    5 cm  . Anemia   . Diverticulosis 05/19/10    moderate  . Anxiety   . Colon cancer     a. 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 Tx with Xeloda.  . Stricture esophagus 06/08/10    EGD  . Gastritis   . NSTEMI (non-ST elevated myocardial infarction)     [place]  . Premature atrial contractions   . Pulmonary fibrosis    Past Surgical History  Procedure Laterality Date  . Breast lumpectomy    . Shoulder arthroscopy w/ acromial repair Bilateral   . Appendectomy    . Colon resection  05/20/2010    [facility]  . Total  abdominal hysterectomy w/ bilateral salpingoophorectomy    . Cardiac catheterization  2006    40% ostial LAD, 60% mid LAD followed by 30-40% disease, 50% just after D2, 60-70% D2 lesion followed by 50% disease, normal LCx/RCA; EF 55%  . Tonsillectomy    . Colon surgery    . Total abdominal hysterectomy    . Dilation and curettage of uterus    . Cataract extraction, bilateral Bilateral     reports that she quit smoking about 31 years ago. Her smoking use included Cigarettes. She has never used smokeless tobacco. She reports that she drinks about 7.0 oz of alcohol per week. She reports that she does not use illicit drugs. family history includes Breast cancer in her daughter; Colon cancer in her brother; Colon polyps in her daughter; Heart disease in her mother; Kidney failure in her father. Allergies  Allergen Reactions  . Codeine Nausea And Vomiting    REACTION: Stomach cramps  . Indomethacin Other (See Comments)    REACTION: TIA type episodes  . Penicillins Nausea And Vomiting    REACTION: vomit  . Sulfonamide Derivatives Nausea And Vomiting    REACTION: Nausea Vomiting      Review of Systems  Constitutional: Negative for fever and chills.  Respiratory: Positive for shortness of breath (Chronic). Negative for cough and wheezing.  Cardiovascular: Positive for leg swelling. Negative for chest pain and palpitations.  Gastrointestinal: Negative for abdominal pain.  Neurological: Negative for dizziness.  Psychiatric/Behavioral: Negative for confusion.       Objective:   Physical Exam  Constitutional: She appears well-developed and well-nourished.  HENT:  Mouth/Throat: Oropharynx is clear and moist.  Neck: No JVD present.  Cardiovascular: Normal rate and regular rhythm.   Pulmonary/Chest: Effort normal. She has no wheezes.  By basilar crackles which apparently are chronic according to daughter  Musculoskeletal: She exhibits edema.  She has bilateral leg edema 1+ pitting with  left leg slightly larger than right. Both legs will mild erythema but especially left leg is warm to touch with some increased erythema involving much of the anterior leg. No obvious skin breakdown  Neurological: She is alert.          Assessment & Plan:  Probable acute cellulitis left leg in a patient with chronic bilateral leg edema which is likely multifactorial. History of diastolic heart failure. Elevate legs frequently. Start Keflex 500 mg 3 times a day for 10 days. Check labs with BNP, CBC, pro BNP.

## 2014-01-20 NOTE — Patient Instructions (Signed)

## 2014-01-21 ENCOUNTER — Telehealth: Payer: Self-pay

## 2014-01-21 NOTE — Telephone Encounter (Signed)
Called [facility] to make aware of changes per Dr. Elease Hashimoto. Recommend continue Lasix 40 mg qAM and add 20 mg early afternoon for one week- then resume 40 mg daily.   Called and spoke with Estill Bamberg: send order to [facility] fax at [phone]

## 2014-01-21 NOTE — Telephone Encounter (Signed)
Confirmation fax received. 

## 2014-01-28 ENCOUNTER — Ambulatory Visit (INDEPENDENT_AMBULATORY_CARE_PROVIDER_SITE_OTHER): Payer: Medicare Other | Admitting: Cardiology

## 2014-01-28 ENCOUNTER — Encounter: Payer: Self-pay | Admitting: Cardiology

## 2014-01-28 VITALS — BP 141/78 | HR 86 | Ht 66.0 in | Wt 184.5 lb

## 2014-01-28 DIAGNOSIS — R600 Localized edema: Secondary | ICD-10-CM

## 2014-01-28 DIAGNOSIS — R609 Edema, unspecified: Secondary | ICD-10-CM

## 2014-01-28 MED ORDER — FUROSEMIDE 80 MG PO TABS: 80.0000 mg | ORAL_TABLET | Freq: Two times a day (BID) | ORAL | Status: DC

## 2014-01-28 NOTE — Patient Instructions (Signed)
Your physician recommends that you schedule a follow-up appointment on 01-30-2014 with Kerin Ransom PA  Wear your stocking as directed  Take Lasix 80 mg two times  A day until you come back on Thursday  We will get labs done when you come back on thursday

## 2014-01-28 NOTE — Progress Notes (Signed)
HPI  The patient presents for follow-up after being in the hospital recently. She's had a couple of visits for volume overload and I reviewed these records extensively. She lives at a nursing home but unfortunately up until recently they were not weighing her daily. The patient has some salty snacks per her daughter. She has some memory issues. She's not keeping her feet elevated. It seems that her weights gradually increase to the point of symptoms. Her daughter seems somewhat exhausted with the difficulties they had. Her mom is a lovely lady who likes to have some scotch every night. Her daughter reports that she is drinking too much of it. The patient doesn't offer any new symptoms such as PND or orthopnea but she has chronic dyspnea. She's not had any chest pressure, neck or arm discomfort. She's not had any palpitations, presyncope or syncope. I did get to see the weights from the last 5 or 6 days and they have been stable. However, a BNP drawn recently, though much lower than in the hospital, was still elevated. Dr. Elease Hashimoto who saw her for her PCP recently told her to increase her Lasix) 20 mg daily.  Allergies  Allergen Reactions  . Codeine Nausea And Vomiting    REACTION: Stomach cramps  . Indomethacin Other (See Comments)    REACTION: TIA type episodes  . Penicillins Nausea And Vomiting    REACTION: vomit  . Sulfonamide Derivatives Nausea And Vomiting    REACTION: Nausea Vomiting    Current Outpatient Prescriptions  Medication Sig Dispense Refill  . acetaminophen (TYLENOL) 325 MG tablet Take 650 mg by mouth every 6 (six) hours as needed for pain.    . AZOPT 1 % ophthalmic suspension Place 1 drop into both eyes 2 (two) times daily.     . bimatoprost (LUMIGAN) 0.01 % SOLN Place 1 drop into both eyes at bedtime.    . celecoxib (CELEBREX) 200 MG capsule Take 200 mg by mouth daily.    . citalopram (CELEXA) 10 MG tablet Take 1 tablet (10 mg total) by mouth daily. 100 tablet 3  .  clopidogrel (PLAVIX) 75 MG tablet Take 1 tablet (75 mg total) by mouth daily. 100 tablet 3  . donepezil (ARICEPT) 5 MG tablet Take 1 tablet (5 mg total) by mouth at bedtime. (Patient taking differently: Take 5 mg by mouth 3 (three) times a week. Monday, Wednesday, and Friday) 100 tablet 3  . fluocinonide cream (LIDEX) 3.66 % Apply 1 application topically as needed.     . furosemide (LASIX) 20 MG tablet Take 20 mg by mouth. For 1 week added to the 40 mg to make 60 mg    . furosemide (LASIX) 40 MG tablet Take 1 tablet (40 mg total) by mouth daily. 30 tablet 1  . metoprolol succinate (TOPROL-XL) 25 MG 24 hr tablet Take 1 tablet (25 mg total) by mouth daily. 30 tablet 1  . Multiple Vitamin (MULTIVITAMIN WITH MINERALS) TABS tablet Take 1 tablet by mouth daily.    . nitroGLYCERIN (NITROSTAT) 0.4 MG SL tablet Place 1 tablet (0.4 mg total) under the tongue every 5 (five) minutes as needed for chest pain (up to 3 doses). 25 tablet 3  . potassium chloride SA (K-DUR,KLOR-CON) 20 MEQ tablet TAKE 2 TABLETS BY MOUTH ONCE DAILY. 30 tablet 5  . sodium chloride (MURO 128) 2 % ophthalmic solution Place 1 drop into the left eye.     No current facility-administered medications for this visit.    Past  Medical History  Diagnosis Date  . Breast cancer     lumpectomy followed by RT and Tamoxifen x 5 years  . Hypertension   . CAD (coronary artery disease)     a. Mod nonobstructive by cath 2006. b. stress test 2007- no evidence of ischemia; EF 58%  . Dermatitis   . Female stress incontinence   . Seborrheic keratosis, inflamed   . Pulmonary fibrosis   . Hearing loss   . Erosive esophagitis 05/19/10  . Hiatal hernia 05/19/10    5 cm  . Anemia   . Diverticulosis 05/19/10    moderate  . Anxiety   . Colon cancer     a. 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 Tx with Xeloda.  . Stricture esophagus 06/08/10    EGD  . Gastritis   .  NSTEMI (non-ST elevated myocardial infarction)     [place]  . Premature atrial contractions   . Pulmonary fibrosis     Past Surgical History  Procedure Laterality Date  . Breast lumpectomy    . Shoulder arthroscopy w/ acromial repair Bilateral   . Appendectomy    . Colon resection  05/20/2010    [facility]  . Total abdominal hysterectomy w/ bilateral salpingoophorectomy    . Cardiac catheterization  2006    40% ostial LAD, 60% mid LAD followed by 30-40% disease, 50% just after D2, 60-70% D2 lesion followed by 50% disease, normal LCx/RCA; EF 55%  . Tonsillectomy    . Colon surgery    . Total abdominal hysterectomy    . Dilation and curettage of uterus    . Cataract extraction, bilateral Bilateral     ROS:  As stated in the HPI and negative for all other systems.  PHYSICAL EXAM BP 141/78 mmHg  Pulse 86  Ht 5\' 6"  (1.676 m)  Wt 184 lb 8 oz (83.689 kg)  BMI 29.79 kg/m2 GENERAL:  Frail but in no distress NECK:  Positive jugular venous distention at 10 cm, waveform within normal limits, carotid upstroke brisk and symmetric, no bruits, no thyromegaly LUNGS:  Clear to auscultation bilaterally BACK:  No CVA tenderness, lordosis HEART:  PMI not displaced or sustained,S1 and S2 within normal limits, no S3, no S4, no clicks, no rubs, no murmurs ABD:  Flat, positive bowel sounds normal in frequency in pitch, no bruits, no rebound, no guarding, no midline pulsatile mass, no hepatomegaly, no splenomegaly EXT:  2 plus pulses throughout, moderate edema to the knees, no cyanosis no clubbing SKIN:  Psoriasis NEURO:  Nonfocal but pleasantly confused.    ASSESSMENT AND PLAN  ACUTE ON CHRONIC DIASTOLIC HF:  Unfortunately the situation and her nursing home is somewhat difficult because it is not skilled nursing. She is getting daily weights and I apparently can order when necessary Lasix. We might be able to get compression stockings applied and I will order these. For the next  few days and going to increase her Lasix to 80 mg. She should come back in 2 days to get a basic metabolic profile and further medication adjustments. I did review recent renal function and this was normal. I'm not sure if I can for salt restriction but I discussed this with her very plainly. I did review fluid restriction. She can try to keep her feet elevated but again this is apparently difficult. I think we will need to see her frequently with frequent lab draws  to try to keep her out of the hospital.  NSTEMI/SOB:  Patient's had no acute anginal symptoms.  HTN:  She has had problems more recently with low blood pressure.  However, if her blood pressure continues to remain high we can slowly titrate her medications. I don't see any absolute contraindication to an ACE or ARB.  CAD:  This has been mild as and we are managing this conservatively as above.    (Greater than 40 minutes reviewing all data with greater than 50% face to face with the patient).

## 2014-01-29 ENCOUNTER — Telehealth: Payer: Self-pay | Admitting: Cardiology

## 2014-01-29 NOTE — Telephone Encounter (Signed)
I have spoken to the staff at [facility] and they wanted to know if it was ok to use 15/20 compression instead of 10/20 and I told them that would be OK

## 2014-01-29 NOTE — Telephone Encounter (Signed)
New message       Question about presc for compression hose

## 2014-01-30 ENCOUNTER — Encounter: Payer: Self-pay | Admitting: Cardiology

## 2014-01-30 ENCOUNTER — Ambulatory Visit (INDEPENDENT_AMBULATORY_CARE_PROVIDER_SITE_OTHER): Payer: Medicare Other | Admitting: Cardiology

## 2014-01-30 VITALS — BP 123/70 | HR 71 | Ht 65.0 in | Wt 182.0 lb

## 2014-01-30 DIAGNOSIS — I5041 Acute combined systolic (congestive) and diastolic (congestive) heart failure: Secondary | ICD-10-CM

## 2014-01-30 DIAGNOSIS — Z79899 Other long term (current) drug therapy: Secondary | ICD-10-CM

## 2014-01-30 DIAGNOSIS — I10 Essential (primary) hypertension: Secondary | ICD-10-CM

## 2014-01-30 DIAGNOSIS — I270 Primary pulmonary hypertension: Secondary | ICD-10-CM

## 2014-01-30 DIAGNOSIS — R0602 Shortness of breath: Secondary | ICD-10-CM

## 2014-01-30 DIAGNOSIS — I272 Pulmonary hypertension, unspecified: Secondary | ICD-10-CM | POA: Insufficient documentation

## 2014-01-30 DIAGNOSIS — I420 Dilated cardiomyopathy: Secondary | ICD-10-CM

## 2014-01-30 LAB — BASIC METABOLIC PANEL
BUN: 21 mg/dL (ref 6–23)
CO2: 30 mEq/L (ref 19–32)
Calcium: 8.7 mg/dL (ref 8.4–10.5)
Chloride: 99 mEq/L (ref 96–112)
Creat: 0.89 mg/dL (ref 0.50–1.10)
Glucose, Bld: 98 mg/dL (ref 70–99)
Potassium: 4.4 mEq/L (ref 3.5–5.3)
Sodium: 138 mEq/L (ref 135–145)

## 2014-01-30 MED ORDER — FUROSEMIDE 20 MG PO TABS: 20.0000 mg | ORAL_TABLET | Freq: Every day | ORAL | Status: DC

## 2014-01-30 MED ORDER — FUROSEMIDE 40 MG PO TABS: 40.0000 mg | ORAL_TABLET | Freq: Every day | ORAL | Status: DC

## 2014-01-30 NOTE — Assessment & Plan Note (Signed)
EF 40-45% 2D Dec 2015

## 2014-01-30 NOTE — Patient Instructions (Signed)
Your physician recommends that you schedule a follow-up appointment in: 4 Weeks with Dr Warren Lacy  Your physician recommends that you return for lab work in: BMP, BNP  Your physician has recommended you make the following change in your medication: Take Lasix 60 mg daily if weight goes over 2lbs take an extra 20 mg of lasix  CHECK WEIGHT DAILY

## 2014-01-30 NOTE — Addendum Note (Signed)
Addended by: Vennie Homans on: 01/30/2014 02:02 PM   Modules accepted: Orders

## 2014-01-30 NOTE — Assessment & Plan Note (Signed)
Controlled.  

## 2014-01-30 NOTE — Assessment & Plan Note (Signed)
PA 63 mmHg at echo Dec 2015

## 2014-01-30 NOTE — Progress Notes (Signed)
01/30/2014 [name]   [birth year]  [id number]  Primary Physician TODD,JEFFREY Chelsea Resides, MD Primary Cardiologist: Dr Percival Spanish  HPI:  81 y/o female followed by Dr Percival Spanish with mixed CHF. She has a history of moderate CAD by cath in 2008. She has COPD.  She lives in an assisted living facility. She was admitted for CHF in Dec. Echo then showed an EF of 40-45% with PA pressure of 63 mmHg. She saw Dr Percival Spanish on 01/28/14 and her Lasix was increased for a few doses (80 mg BID). She is here for follow up. Her daughter says when the pt is diuresed she feels poorly as she does today- weak, slightly confused. Her edema and dyspnea have improved. Her wgt is down 2 lbs.    Current Outpatient Prescriptions  Medication Sig Dispense Refill  . acetaminophen (TYLENOL) 325 MG tablet Take 650 mg by mouth every 6 (six) hours as needed for pain.    . AZOPT 1 % ophthalmic suspension Place 1 drop into both eyes 2 (two) times daily.     . bimatoprost (LUMIGAN) 0.01 % SOLN Place 1 drop into both eyes at bedtime.    . celecoxib (CELEBREX) 200 MG capsule Take 200 mg by mouth daily.    . citalopram (CELEXA) 10 MG tablet Take 1 tablet (10 mg total) by mouth daily. 100 tablet 3  . clopidogrel (PLAVIX) 75 MG tablet Take 1 tablet (75 mg total) by mouth daily. 100 tablet 3  . donepezil (ARICEPT) 5 MG tablet Take 1 tablet (5 mg total) by mouth at bedtime. (Patient taking differently: Take 5 mg by mouth 3 (three) times a week. Monday, Wednesday, and Friday) 100 tablet 3  . fluocinonide cream (LIDEX) 2.20 % Apply 1 application topically as needed.     . furosemide (LASIX) 80 MG tablet Take 1 tablet (80 mg total) by mouth 2 (two) times daily. 30 tablet 0  . metoprolol succinate (TOPROL-XL) 25 MG 24 hr tablet Take 1 tablet (25 mg total) by mouth daily. 30 tablet 1  . Multiple Vitamin (MULTIVITAMIN WITH MINERALS) TABS tablet Take 1 tablet by mouth daily.    . nitroGLYCERIN (NITROSTAT) 0.4 MG SL tablet Place 1 tablet (0.4  mg total) under the tongue every 5 (five) minutes as needed for chest pain (up to 3 doses). 25 tablet 3  . potassium chloride SA (K-DUR,KLOR-CON) 20 MEQ tablet TAKE 2 TABLETS BY MOUTH ONCE DAILY. 30 tablet 5  . sodium chloride (MURO 128) 2 % ophthalmic solution Place 1 drop into the left eye.     No current facility-administered medications for this visit.    Allergies  Allergen Reactions  . Codeine Nausea And Vomiting    REACTION: Stomach cramps  . Indomethacin Other (See Comments)    REACTION: TIA type episodes  . Penicillins Nausea And Vomiting    REACTION: vomit  . Sulfonamide Derivatives Nausea And Vomiting    REACTION: Nausea Vomiting    History   Social History  . Marital Status: Divorced    Spouse Name: N/A    Number of Children: N/A  . Years of Education: N/A   Occupational History  . retired    Social History Main Topics  . Smoking status: Former Smoker    Types: Cigarettes    Quit date: 01/04/1983  . Smokeless tobacco: Never Used     Comment: Quit 25-30 years ago  . Alcohol Use: 7.0 oz/week    14 Not specified per week  Comment: a drink a night, a little scotch helps you  . Drug Use: No  . Sexual Activity: No   Other Topics Concern  . Not on file   Social History Narrative     Review of Systems: General: negative for chills, fever, night sweats or weight changes.  Cardiovascular: negative for chest pain, dyspnea on exertion, edema, orthopnea, palpitations, paroxysmal nocturnal dyspnea or shortness of breath Dermatological: negative for rash Respiratory: negative for cough or wheezing Urologic: negative for hematuria Abdominal: negative for nausea, vomiting, diarrhea, bright red blood per rectum, melena, or hematemesis Neurologic: negative for visual changes, syncope, or dizziness All other systems reviewed and are otherwise negative except as noted above.    Blood pressure 123/70, pulse 71, height 5\' 5"  (1.651 m), weight 182 lb (82.555 kg).    General appearance: alert, cooperative, no distress and in wheel chair Lungs: bilat crackles (ILD) Heart: regular rate and rhythm Extremities: compression stockings in place   ASSESSMENT AND PLAN:   Combined congestive systolic and diastolic heart failure DC'd 12/12/13   Pulmonary hypertension PA 63 mmHg at echo Dec 2015   Essential hypertension Controlled   Congestive dilated cardiomyopathy EF 40-45% 2D Dec 2015    PLAN  Her daughter suggested her mother does well on 60 mg daily. She would like the assisted living facility to be instructed to give her an extra 20 mg Lasix times one if her wgt is up 2 lbs or more. This sounds perfectly reasonable to me. I ordered lab today including a BNP. She can see Dr Percival Spanish in a month or sooner if needed.   [name] KPA-C 01/30/2014 1:47 PM

## 2014-01-30 NOTE — Assessment & Plan Note (Signed)
[place] 12/12/13

## 2014-01-31 LAB — PRO B NATRIURETIC PEPTIDE: Pro B Natriuretic peptide (BNP): 2363 pg/mL — ABNORMAL HIGH (ref <451)

## 2014-02-05 ENCOUNTER — Other Ambulatory Visit: Payer: Self-pay | Admitting: Family Medicine

## 2014-02-06 ENCOUNTER — Emergency Department (HOSPITAL_COMMUNITY): Payer: Medicare Other

## 2014-02-06 ENCOUNTER — Encounter (HOSPITAL_COMMUNITY): Payer: Self-pay | Admitting: *Deleted

## 2014-02-06 ENCOUNTER — Observation Stay (HOSPITAL_COMMUNITY)
Admission: EM | Admit: 2014-02-06 | Discharge: 2014-02-07 | Disposition: A | Discharge status: Skilled Nursing Facility | Payer: Medicare Other | Attending: Internal Medicine | Admitting: Internal Medicine

## 2014-02-06 DIAGNOSIS — D649 Anemia, unspecified: Secondary | ICD-10-CM | POA: Diagnosis not present

## 2014-02-06 DIAGNOSIS — Z85038 Personal history of other malignant neoplasm of large intestine: Secondary | ICD-10-CM | POA: Insufficient documentation

## 2014-02-06 DIAGNOSIS — Z87891 Personal history of nicotine dependence: Secondary | ICD-10-CM | POA: Diagnosis not present

## 2014-02-06 DIAGNOSIS — I252 Old myocardial infarction: Secondary | ICD-10-CM | POA: Insufficient documentation

## 2014-02-06 DIAGNOSIS — I5032 Chronic diastolic (congestive) heart failure: Secondary | ICD-10-CM | POA: Diagnosis not present

## 2014-02-06 DIAGNOSIS — H9190 Unspecified hearing loss, unspecified ear: Secondary | ICD-10-CM | POA: Diagnosis not present

## 2014-02-06 DIAGNOSIS — Z880 Allergy status to penicillin: Secondary | ICD-10-CM | POA: Insufficient documentation

## 2014-02-06 DIAGNOSIS — R531 Weakness: Secondary | ICD-10-CM | POA: Insufficient documentation

## 2014-02-06 DIAGNOSIS — L821 Other seborrheic keratosis: Secondary | ICD-10-CM | POA: Diagnosis not present

## 2014-02-06 DIAGNOSIS — I272 Other secondary pulmonary hypertension: Secondary | ICD-10-CM | POA: Insufficient documentation

## 2014-02-06 DIAGNOSIS — Z885 Allergy status to narcotic agent status: Secondary | ICD-10-CM | POA: Diagnosis not present

## 2014-02-06 DIAGNOSIS — Z79899 Other long term (current) drug therapy: Secondary | ICD-10-CM | POA: Insufficient documentation

## 2014-02-06 DIAGNOSIS — I10 Essential (primary) hypertension: Secondary | ICD-10-CM | POA: Diagnosis not present

## 2014-02-06 DIAGNOSIS — Z882 Allergy status to sulfonamides status: Secondary | ICD-10-CM | POA: Diagnosis not present

## 2014-02-06 DIAGNOSIS — J8410 Pulmonary fibrosis, unspecified: Secondary | ICD-10-CM | POA: Diagnosis not present

## 2014-02-06 DIAGNOSIS — I517 Cardiomegaly: Secondary | ICD-10-CM | POA: Insufficient documentation

## 2014-02-06 DIAGNOSIS — F329 Major depressive disorder, single episode, unspecified: Secondary | ICD-10-CM | POA: Diagnosis not present

## 2014-02-06 DIAGNOSIS — R42 Dizziness and giddiness: Secondary | ICD-10-CM | POA: Insufficient documentation

## 2014-02-06 DIAGNOSIS — Z853 Personal history of malignant neoplasm of breast: Secondary | ICD-10-CM | POA: Diagnosis not present

## 2014-02-06 DIAGNOSIS — R0602 Shortness of breath: Secondary | ICD-10-CM

## 2014-02-06 DIAGNOSIS — R110 Nausea: Secondary | ICD-10-CM | POA: Diagnosis not present

## 2014-02-06 DIAGNOSIS — F410 Panic disorder [episodic paroxysmal anxiety] without agoraphobia: Secondary | ICD-10-CM | POA: Diagnosis not present

## 2014-02-06 DIAGNOSIS — N393 Stress incontinence (female) (male): Secondary | ICD-10-CM | POA: Diagnosis not present

## 2014-02-06 DIAGNOSIS — I2510 Atherosclerotic heart disease of native coronary artery without angina pectoris: Secondary | ICD-10-CM | POA: Insufficient documentation

## 2014-02-06 DIAGNOSIS — F419 Anxiety disorder, unspecified: Secondary | ICD-10-CM | POA: Diagnosis not present

## 2014-02-06 DIAGNOSIS — R0600 Dyspnea, unspecified: Secondary | ICD-10-CM | POA: Diagnosis present

## 2014-02-06 LAB — COMPREHENSIVE METABOLIC PANEL
ALBUMIN: 3.6 g/dL (ref 3.5–5.2)
ALT: 14 U/L (ref 0–35)
ANION GAP: 9 (ref 5–15)
AST: 27 U/L (ref 0–37)
Alkaline Phosphatase: 54 U/L (ref 39–117)
BILIRUBIN TOTAL: 0.9 mg/dL (ref 0.3–1.2)
BUN: 19 mg/dL (ref 6–23)
CO2: 27 mmol/L (ref 19–32)
CREATININE: 0.91 mg/dL (ref 0.50–1.10)
Calcium: 8.8 mg/dL (ref 8.4–10.5)
Chloride: 101 mmol/L (ref 96–112)
GFR calc non Af Amer: 54 mL/min — ABNORMAL LOW (ref >90)
GFR, EST AFRICAN AMERICAN: 62 mL/min — AB (ref >90)
GLUCOSE: 100 mg/dL — AB (ref 70–99)
Potassium: 4.2 mmol/L (ref 3.5–5.1)
SODIUM: 137 mmol/L (ref 135–145)
TOTAL PROTEIN: 6.1 g/dL (ref 6.0–8.3)

## 2014-02-06 LAB — CBC
HCT: 34.7 % — ABNORMAL LOW (ref 36.0–46.0)
HEMOGLOBIN: 11.3 g/dL — AB (ref 12.0–15.0)
MCH: 30.7 pg (ref 26.0–34.0)
MCHC: 32.6 g/dL (ref 30.0–36.0)
MCV: 94.3 fL (ref 78.0–100.0)
Platelets: 148 10*3/uL — ABNORMAL LOW (ref 150–400)
RBC: 3.68 MIL/uL — AB (ref 3.87–5.11)
RDW: 15.1 % (ref 11.5–15.5)
WBC: 6.9 10*3/uL (ref 4.0–10.5)

## 2014-02-06 LAB — BRAIN NATRIURETIC PEPTIDE: B NATRIURETIC PEPTIDE 5: 613.8 pg/mL — AB (ref 0.0–100.0)

## 2014-02-06 LAB — I-STAT TROPONIN, ED: TROPONIN I, POC: 0.01 ng/mL (ref 0.00–0.08)

## 2014-02-06 MED ORDER — FUROSEMIDE 40 MG PO TABS
60.0000 mg | ORAL_TABLET | Freq: Two times a day (BID) | ORAL | Status: DC
  Administered 2014-02-07: 60 mg via ORAL
  Filled 2014-02-06 (×3): qty 1

## 2014-02-06 MED ORDER — ACETAMINOPHEN 325 MG PO TABS: 650.0000 mg | ORAL_TABLET | Freq: Four times a day (QID) | ORAL | Status: DC | PRN

## 2014-02-06 MED ORDER — SODIUM CHLORIDE 0.9 % IJ SOLN: 3.0000 mL | INTRAMUSCULAR | Status: DC | PRN

## 2014-02-06 MED ORDER — CITALOPRAM HYDROBROMIDE 10 MG PO TABS
10.0000 mg | ORAL_TABLET | Freq: Every day | ORAL | Status: DC
  Administered 2014-02-07 (×2): 10 mg via ORAL
  Filled 2014-02-06 (×2): qty 1

## 2014-02-06 MED ORDER — NITROGLYCERIN 0.4 MG SL SUBL
0.4000 mg | SUBLINGUAL_TABLET | SUBLINGUAL | Status: DC | PRN
Start: 2014-02-06 — End: 2014-02-07

## 2014-02-06 MED ORDER — DONEPEZIL HCL 5 MG PO TABS
5.0000 mg | ORAL_TABLET | Freq: Every day | ORAL | Status: DC
  Administered 2014-02-07: 5 mg via ORAL
  Filled 2014-02-06 (×2): qty 1

## 2014-02-06 MED ORDER — HEPARIN SODIUM (PORCINE) 5000 UNIT/ML IJ SOLN
5000.0000 [IU] | Freq: Three times a day (TID) | INTRAMUSCULAR | Status: DC
  Administered 2014-02-07 (×2): 5000 [IU] via SUBCUTANEOUS
  Filled 2014-02-06 (×4): qty 1

## 2014-02-06 MED ORDER — SODIUM CHLORIDE 0.9 % IJ SOLN
3.0000 mL | Freq: Two times a day (BID) | INTRAMUSCULAR | Status: DC
  Administered 2014-02-07 (×2): 3 mL via INTRAVENOUS

## 2014-02-06 MED ORDER — CLOPIDOGREL BISULFATE 75 MG PO TABS
75.0000 mg | ORAL_TABLET | Freq: Every day | ORAL | Status: DC
  Administered 2014-02-07: 75 mg via ORAL
  Filled 2014-02-06: qty 1

## 2014-02-06 MED ORDER — METHYLPREDNISOLONE SODIUM SUCC 125 MG IJ SOLR
80.0000 mg | Freq: Two times a day (BID) | INTRAMUSCULAR | Status: DC
  Administered 2014-02-07 (×2): 80 mg via INTRAVENOUS
  Filled 2014-02-06 (×2): qty 1.28
  Filled 2014-02-06: qty 2

## 2014-02-06 MED ORDER — METOPROLOL SUCCINATE ER 25 MG PO TB24
25.0000 mg | ORAL_TABLET | Freq: Every day | ORAL | Status: DC
  Administered 2014-02-07: 25 mg via ORAL
  Filled 2014-02-06: qty 1

## 2014-02-06 MED ORDER — SODIUM CHLORIDE (HYPERTONIC) 5 % OP SOLN
1.0000 [drp] | OPHTHALMIC | Status: DC | PRN
  Filled 2014-02-06: qty 15

## 2014-02-06 MED ORDER — IOHEXOL 350 MG/ML SOLN
100.0000 mL | Freq: Once | INTRAVENOUS | Status: AC | PRN
  Administered 2014-02-06: 100 mL via INTRAVENOUS

## 2014-02-06 MED ORDER — SODIUM CHLORIDE 0.9 % IV SOLN: 250.0000 mL | INTRAVENOUS | Status: DC | PRN

## 2014-02-06 NOTE — ED Notes (Signed)
Pt. Returned from xray 

## 2014-02-06 NOTE — ED Notes (Signed)
CT came to get pt. Pt eating. CT will return in 30 minutes.

## 2014-02-06 NOTE — ED Notes (Signed)
Pt ambulated with stand by assist with BM, RN. Pt RR increased to 40 HR stayed constant around 70 and SpO2 decreased to 91%.

## 2014-02-06 NOTE — ED Provider Notes (Signed)
CSN: [id number]     Arrival date & time 02/06/14  1225 History   First MD Initiated Contact with Patient 02/06/14 1253     Chief Complaint  Patient presents with  . Dizziness  . Shortness of Breath     (Consider location/radiation/quality/duration/timing/severity/associated sxs/prior Treatment) Patient is a 81 y.o. female presenting with shortness of breath. The history is provided by the patient.  Shortness of Breath Severity:  Moderate Onset quality:  Gradual Timing:  Intermittent Chronicity:  Recurrent Context: not URI   Context comment:  Recent increased lasix doses due to CHF Relieved by:  Rest Worsened by:  Exertion   Past Medical History  Diagnosis Date  . Breast cancer     lumpectomy followed by RT and Tamoxifen x 5 years  . Hypertension   . CAD (coronary artery disease)     a. Mod nonobstructive by cath 2006. b. stress test 2007- no evidence of ischemia; EF 58%  . Dermatitis   . Female stress incontinence   . Seborrheic keratosis, inflamed   . Pulmonary fibrosis   . Hearing loss   . Erosive esophagitis 05/19/10  . Hiatal hernia 05/19/10    5 cm  . Anemia   . Diverticulosis 05/19/10    moderate  . Anxiety   . Colon cancer     a. 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 Tx with Xeloda.  . Stricture esophagus 06/08/10    EGD  . Gastritis   . NSTEMI (non-ST elevated myocardial infarction)     [place]  . Premature atrial contractions   . Pulmonary fibrosis    Past Surgical History  Procedure Laterality Date  . Breast lumpectomy    . Shoulder arthroscopy w/ acromial repair Bilateral   . Appendectomy    . Colon resection  05/20/2010    [facility]  . Total abdominal hysterectomy w/ bilateral salpingoophorectomy    . Cardiac catheterization  2006    40% ostial LAD, 60% mid LAD followed by 30-40% disease, 50% just after D2, 60-70% D2 lesion followed by 50%  disease, normal LCx/RCA; EF 55%  . Tonsillectomy    . Colon surgery    . Total abdominal hysterectomy    . Dilation and curettage of uterus    . Cataract extraction, bilateral Bilateral    Family History  Problem Relation Age of Onset  . Heart disease Mother   . Colon polyps Daughter   . Colon cancer Brother     in 44s  . Breast cancer Daughter   . Kidney failure Father    History  Substance Use Topics  . Smoking status: Former Smoker    Types: Cigarettes    Quit date: 01/04/1983  . Smokeless tobacco: Never Used     Comment: Quit 25-30 years ago  . Alcohol Use: 7.0 oz/week    14 Not specified per week     Comment: a drink a night, a little scotch helps you   OB History    No data available     Review of Systems  Respiratory: Positive for shortness of breath.   All other systems reviewed and are negative.     Allergies  Codeine; Indomethacin; Penicillins; and Sulfonamide derivatives  Home Medications   Prior to Admission medications   Medication Sig Start Date End Date Taking? Authorizing Provider  acetaminophen (TYLENOL) 325 MG tablet Take 650 mg by mouth every 6 (six) hours  as needed for pain.   Yes Historical Provider, MD  AZOPT 1 % ophthalmic suspension Place 1 drop into both eyes 2 (two) times daily.  11/15/10  Yes Historical Provider, MD  bimatoprost (LUMIGAN) 0.01 % SOLN Place 1 drop into both eyes at bedtime.   Yes Historical Provider, MD  celecoxib (CELEBREX) 200 MG capsule Take 200 mg by mouth daily.   Yes Historical Provider, MD  citalopram (CELEXA) 10 MG tablet Take 1 tablet (10 mg total) by mouth daily. 02/04/13  Yes Dorena Cookey, MD  clopidogrel (PLAVIX) 75 MG tablet Take 1 tablet (75 mg total) by mouth daily. 02/04/13  Yes Dorena Cookey, MD  donepezil (ARICEPT) 5 MG tablet Take 1 tablet (5 mg total) by mouth at bedtime. Patient taking differently: Take 5 mg by mouth 3 (three) times a week. Monday, Wednesday, and Friday 02/04/13  Yes Dorena Cookey, MD   furosemide (LASIX) 20 MG tablet Take 1 tablet (20 mg total) by mouth daily. to the 40 mg to make 60 mg 01/30/14  Yes Erlene Quan, PA-C  furosemide (LASIX) 40 MG tablet Take 1 tablet (40 mg total) by mouth daily. Add to 20 mg to make 60 mg 01/30/14  Yes Erlene Quan, PA-C  metoprolol succinate (TOPROL-XL) 25 MG 24 hr tablet TAKE 1 TABLET BY MOUTH ONCE A DAY. 02/05/14  Yes Dorena Cookey, MD  Multiple Vitamins-Minerals (THERAPEUTIC M PO) Take 130 mg by mouth daily.   Yes Historical Provider, MD  nitroGLYCERIN (NITROSTAT) 0.4 MG SL tablet Place 1 tablet (0.4 mg total) under the tongue every 5 (five) minutes as needed for chest pain (up to 3 doses). 02/04/13  Yes Dorena Cookey, MD  Polyethyl Glycol-Propyl Glycol (SYSTANE ULTRA OP) Apply 1 drop to eye 4 (four) times daily as needed (dry eyes).   Yes Historical Provider, MD  potassium chloride SA (K-DUR,KLOR-CON) 20 MEQ tablet TAKE 2 TABLETS BY MOUTH ONCE DAILY. 01/09/14  Yes Dorena Cookey, MD  fluocinonide cream (LIDEX) 3.22 % Apply 1 application topically as needed.     Historical Provider, MD  Multiple Vitamin (MULTIVITAMIN WITH MINERALS) TABS tablet Take 1 tablet by mouth daily.    Historical Provider, MD  sodium chloride (MURO 128) 2 % ophthalmic solution Place 1 drop into the left eye.    Historical Provider, MD   BP 153/75 mmHg  Pulse 66  Temp(Src) 97.9 F (36.6 C) (Oral)  Resp 21  Ht 5\' 5"  (1.651 m)  Wt 180 lb (81.647 kg)  BMI 29.95 kg/m2  SpO2 96% Physical Exam  Constitutional: She is oriented to person, place, and time. She appears well-developed and well-nourished. No distress.  HENT:  Head: Normocephalic and atraumatic.  Mouth/Throat: Oropharynx is clear and moist.  Eyes: EOM are normal. Pupils are equal, round, and reactive to light.  Neck: Normal range of motion. Neck supple.  Cardiovascular: Normal rate and regular rhythm.  Exam reveals no friction rub.   No murmur heard. Pulmonary/Chest: Effort normal and breath sounds normal.  No respiratory distress. She has no wheezes. She has no rales.  Abdominal: Soft. She exhibits no distension. There is no tenderness. There is no rebound.  Musculoskeletal: Normal range of motion. She exhibits no edema.  Neurological: She is alert and oriented to person, place, and time.  Skin: She is not diaphoretic.  Nursing note and vitals reviewed.   ED Course  Procedures (including critical care time) Labs Review Labs Reviewed  CBC - Abnormal; Notable for  the following:    RBC 3.68 (*)    Hemoglobin 11.3 (*)    HCT 34.7 (*)    Platelets 148 (*)    All other components within normal limits  COMPREHENSIVE METABOLIC PANEL - Abnormal; Notable for the following:    Glucose, Bld 100 (*)    GFR calc non Af Amer 54 (*)    GFR calc Af Amer 62 (*)    All other components within normal limits  BRAIN NATRIURETIC PEPTIDE - Abnormal; Notable for the following:    B Natriuretic Peptide 613.8 (*)    All other components within normal limits  I-STAT TROPOININ, ED    Imaging Review Dg Chest 2 View  02/06/2014   CLINICAL DATA:  Shortness of breath, dizziness, nausea and weakness. History of breast cancer.  EXAM: CHEST  2 VIEW  COMPARISON:  PA and lateral chest 12/09/2013 and 10/11/2013. CT chest 10/11/2013.  FINDINGS: There is marked cardiomegaly but no pulmonary edema. Coarsened pulmonary interstitium is compatible with mild fibrosis as seen on CT scan. No consolidative process, pneumothorax or effusion is identified.  IMPRESSION: Cardiomegaly and pulmonary fibrosis without acute disease.   Electronically Signed   By: Inge Rise M.D.   On: 02/06/2014 15:12     EKG Interpretation   Date/Time:  Thursday February 06 2014 12:47:55 EST Ventricular Rate:  71 PR Interval:  167 QRS Duration: 148 QT Interval:  465 QTC Calculation: 505 R Axis:   0 Text Interpretation:  Sinus rhythm Right bundle branch block No  significant change since last tracing Confirmed by Mingo Amber  MD, [place]  ([id number]) on  02/06/2014 12:54:14 PM      MDM   Final diagnoses:  Shortness of breath    89F here with SOB. Worsening over the past few days. Hx of CHF, recent issues with diuresis. Having bad DOE now. No CP. No fevers or cough. Here vitals ok. CXR with some fibrosis, no infiltrates or effusion. BNP mildly elevated, but much improved from prior.  On ambulation, no hypoxia, but severely increased RR from 20 to 40. I spoke with Dr. Candiss Norse who asked for a PE scan prior to admission.    Evelina Bucy, MD 02/08/14 [phone]

## 2014-02-06 NOTE — ED Notes (Signed)
Family at bedside. 

## 2014-02-06 NOTE — H&P (Signed)
Hospitalist Admission History and Physical  Patient name: [name] Medical record number: [id number] Date of birth: 1920-03-19 Age: 81 y.o. Gender: female  Primary Care Provider: Joycelyn Man, MD  Chief Complaint: dyspnea  History of Present Illness:This is a high functioning 81 y.o. year old female with significant past medical history of pulmonary fibrosis, diastolic dysfunction, CAD, HTN presenting with dyspnea. Resident of local ALF. Per report. Pt w/ worsening exertional dyspnea above baseline over past 24 hours. Pt had 1 episode of dizziness. No LOC. No head trauma. Pt noted to have been admitted approx 2 months ago for similar sxs. Had 2D ECHO that showed EF 45-50%, LVH, diffuse hypokinesis. Was diuresed w/ resolution of dyspnea. Followed by [facility]. Per family, was recently seen by [facility] for f/u. Completed course for LE cellulitis and LE edema. Family states that weights have been stable at ALF. No reported fevers, chills, nausea, vomiting, diarrhea, dysuria. Per pt, " I feel fine, just short of breath" Presented to ER T 97.9, HR 60s-70s, resp 10s, BP 120s-160s, satting 95% on RA. CBC and CMET grossly WNL.  Trop neg x1. EKG NSR. BNP 613 (proBNP 2400 01/30/14). CT Angio negative for PE, Cardiomegaly, coronary artery disease, moderate-sized hiatal hernia, mild pulmonary fibrosis. Was walked in ER w/ noted exertional dyspnea, but no reported hypoxia.   Assessment and Plan: [name] is a 81 y.o. year old female presenting with dyspnea   Active Problems:   Dyspnea   1- Dyspnea -likely multifactorial in setting of diastolic CHF and pulmonary fibrosis -no hypoxia, increased WOB on exam  -CT Angio negative for PE  -IV solumedrol (lower dosing given age) -fairly euvolemic on exam  -Admission wt 81.6 kg  -wt 12/10- 80.6 kg -oral lasix @ 50% increased dosing  -strict Is and Os daily weights -no wheezing on exam-hold duonebs -tele bed reassess in am   2-  Chronic diastolic heart failure  -85/8850 ECHO EF 45-50%, diffuse hypokinesis -fairly euvolemic on exam  -as above  -cont lasix  -strict Is and Os  -daily weights -follow  3-CAD/HTN -no active CP  -cycle CEs   4-Pulmonary Fibrosis -IV solumedrol at reduced dosing -no hypoxia -follow  FEN/GI: heart healthy diet  Prophylaxis: sub q heparin Disposition: pending further evaluation  Code Status:Full Code   Patient Active Problem List   Diagnosis Date Noted  . Pulmonary hypertension 01/30/2014  . Congestive dilated cardiomyopathy 01/30/2014  . Physical deconditioning   . Combined congestive systolic and diastolic heart failure 27/74/1287  . Elevated brain natriuretic peptide (BNP) level   . Dyspnea 12/09/2013  . Normocytic anemia 12/09/2013  . Swelling of lower extremity 12/09/2013  . Right lower lobe pneumonia 09/30/2013  . Short-term memory loss 02/04/2013  . Depression, involutional 02/04/2013  . Pain in left shoulder 12/20/2010  . Pain in left knee 12/20/2010  . Deafness 12/20/2010  . Left arm numbness 10/11/2010  . Cancer of descending colon 07/09/2010  . Colon cancer 06/02/2010  . INCONTINENCE, FEMALE STRESS 10/13/2009  . PULMONARY FIBROSIS ILD POST INFLAMMATORY CHRONIC 08/15/2008  . Coronary atherosclerosis 10/03/2007  . SEBORRHEIC KERATOSIS, INFLAMED 11/10/2006  . Essential hypertension 07/31/2006  . BREAST CANCER, HX OF 07/31/2006   Past Medical History: Past Medical History  Diagnosis Date  . Breast cancer     lumpectomy followed by RT and Tamoxifen x 5 years  . Hypertension   . CAD (coronary artery disease)     a. Mod nonobstructive by cath 2006. b. stress test 2007-  no evidence of ischemia; EF 58%  . Dermatitis   . Female stress incontinence   . Seborrheic keratosis, inflamed   . Pulmonary fibrosis   . Hearing loss   . Erosive esophagitis 05/19/10  . Hiatal hernia 05/19/10    5 cm  . Anemia   . Diverticulosis 05/19/10    moderate  . Anxiety   .  Colon cancer     a. 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 Tx with Xeloda.  . Stricture esophagus 06/08/10    EGD  . Gastritis   . NSTEMI (non-ST elevated myocardial infarction)     [place]  . Premature atrial contractions   . Pulmonary fibrosis     Past Surgical History: Past Surgical History  Procedure Laterality Date  . Breast lumpectomy    . Shoulder arthroscopy w/ acromial repair Bilateral   . Appendectomy    . Colon resection  05/20/2010    [facility]  . Total abdominal hysterectomy w/ bilateral salpingoophorectomy    . Cardiac catheterization  2006    40% ostial LAD, 60% mid LAD followed by 30-40% disease, 50% just after D2, 60-70% D2 lesion followed by 50% disease, normal LCx/RCA; EF 55%  . Tonsillectomy    . Colon surgery    . Total abdominal hysterectomy    . Dilation and curettage of uterus    . Cataract extraction, bilateral Bilateral     Social History: History   Social History  . Marital Status: Divorced    Spouse Name: N/A    Number of Children: N/A  . Years of Education: N/A   Occupational History  . retired    Social History Main Topics  . Smoking status: Former Smoker    Types: Cigarettes    Quit date: 01/04/1983  . Smokeless tobacco: Never Used     Comment: Quit 25-30 years ago  . Alcohol Use: 7.0 oz/week    14 Not specified per week     Comment: a drink a night, a little scotch helps you  . Drug Use: No  . Sexual Activity: No   Other Topics Concern  . None   Social History Narrative    Family History: Family History  Problem Relation Age of Onset  . Heart disease Mother   . Colon polyps Daughter   . Colon cancer Brother     in 16s  . Breast cancer Daughter   . Kidney failure Father     Allergies: Allergies  Allergen Reactions  . Codeine Nausea And Vomiting    REACTION: Stomach cramps  . Indomethacin Other (See Comments)     REACTION: TIA type episodes  . Penicillins Nausea And Vomiting    REACTION: vomit  . Sulfonamide Derivatives Nausea And Vomiting    REACTION: Nausea Vomiting    Current Facility-Administered Medications  Medication Dose Route Frequency Provider Last Rate Last Dose  . 0.9 %  sodium chloride infusion  250 mL Intravenous PRN Shanda Howells, MD      . acetaminophen (TYLENOL) tablet 650 mg  650 mg Oral Q6H PRN Shanda Howells, MD      . citalopram (CELEXA) tablet 10 mg  10 mg Oral Daily Shanda Howells, MD      . clopidogrel (PLAVIX) tablet 75 mg  75 mg Oral Daily Shanda Howells, MD      . donepezil (ARICEPT) tablet 5 mg  5 mg Oral QHS Shanda Howells,  MD      . Derrill Memo ON 02/07/2014] furosemide (LASIX) tablet 60 mg  60 mg Oral BID Shanda Howells, MD      . heparin injection 5,000 Units  5,000 Units Subcutaneous 3 times per day Shanda Howells, MD      . methylPREDNISolone sodium succinate (SOLU-MEDROL) 125 mg/2 mL injection 80 mg  80 mg Intravenous Q12H Shanda Howells, MD      . metoprolol succinate (TOPROL-XL) 24 hr tablet 25 mg  25 mg Oral Daily Shanda Howells, MD      . nitroGLYCERIN (NITROSTAT) SL tablet 0.4 mg  0.4 mg Sublingual Q5 min PRN Shanda Howells, MD      . sodium chloride (MURO 128) 2 % ophthalmic solution 1 drop  1 drop Left Eye Q4H PRN Shanda Howells, MD      . sodium chloride 0.9 % injection 3 mL  3 mL Intravenous Q12H Shanda Howells, MD      . sodium chloride 0.9 % injection 3 mL  3 mL Intravenous Q12H Shanda Howells, MD      . sodium chloride 0.9 % injection 3 mL  3 mL Intravenous PRN Shanda Howells, MD       Current Outpatient Prescriptions  Medication Sig Dispense Refill  . acetaminophen (TYLENOL) 325 MG tablet Take 650 mg by mouth every 6 (six) hours as needed for pain.    . AZOPT 1 % ophthalmic suspension Place 1 drop into both eyes 2 (two) times daily.     . bimatoprost (LUMIGAN) 0.01 % SOLN Place 1 drop into both eyes at bedtime.    . celecoxib (CELEBREX) 200 MG capsule Take 200 mg by  mouth daily.    . citalopram (CELEXA) 10 MG tablet Take 1 tablet (10 mg total) by mouth daily. 100 tablet 3  . clopidogrel (PLAVIX) 75 MG tablet Take 1 tablet (75 mg total) by mouth daily. 100 tablet 3  . donepezil (ARICEPT) 5 MG tablet Take 1 tablet (5 mg total) by mouth at bedtime. (Patient taking differently: Take 5 mg by mouth 3 (three) times a week. Monday, Wednesday, and Friday) 100 tablet 3  . furosemide (LASIX) 20 MG tablet Take 1 tablet (20 mg total) by mouth daily. to the 40 mg to make 60 mg 30 tablet 9  . furosemide (LASIX) 40 MG tablet Take 1 tablet (40 mg total) by mouth daily. Add to 20 mg to make 60 mg 30 tablet 9  . metoprolol succinate (TOPROL-XL) 25 MG 24 hr tablet TAKE 1 TABLET BY MOUTH ONCE A DAY. 30 tablet 11  . Multiple Vitamins-Minerals (THERAPEUTIC M PO) Take 130 mg by mouth daily.    . nitroGLYCERIN (NITROSTAT) 0.4 MG SL tablet Place 1 tablet (0.4 mg total) under the tongue every 5 (five) minutes as needed for chest pain (up to 3 doses). 25 tablet 3  . Polyethyl Glycol-Propyl Glycol (SYSTANE ULTRA OP) Apply 1 drop to eye 4 (four) times daily as needed (dry eyes).    . potassium chloride SA (K-DUR,KLOR-CON) 20 MEQ tablet TAKE 2 TABLETS BY MOUTH ONCE DAILY. 30 tablet 5  . fluocinonide cream (LIDEX) 7.34 % Apply 1 application topically as needed.     . Multiple Vitamin (MULTIVITAMIN WITH MINERALS) TABS tablet Take 1 tablet by mouth daily.    . sodium chloride (MURO 128) 2 % ophthalmic solution Place 1 drop into the left eye.     Review Of Systems: 12 point ROS negative except as noted above in HPI.  Physical Exam:  Filed Vitals:   02/06/14 2000  BP: 122/57  Pulse: 74  Temp:   Resp: 22    General: alert and cooperative HEENT: PERRLA and extra ocular movement intact Heart: S1, S2 normal, no murmur, rub or gallop, regular rate and rhythm Lungs: clear to auscultation, no wheezes or rales and unlabored breathing Abdomen: abdomen is soft without significant tenderness,  masses, organomegaly or guarding Extremities: 2+ peripheral pulses, mild vascular congestion changes, trace LE edema  Skin:as above  Neurology: normal without focal findings  Labs and Imaging: Lab Results  Component Value Date/Time   NA 137 02/06/2014 01:40 PM   K 4.2 02/06/2014 01:40 PM   CL 101 02/06/2014 01:40 PM   CO2 27 02/06/2014 01:40 PM   BUN 19 02/06/2014 01:40 PM   CREATININE 0.91 02/06/2014 01:40 PM   CREATININE 0.89 01/30/2014 02:14 PM   GLUCOSE 100* 02/06/2014 01:40 PM   Lab Results  Component Value Date   WBC 6.9 02/06/2014   HGB 11.3* 02/06/2014   HCT 34.7* 02/06/2014   MCV 94.3 02/06/2014   PLT 148* 02/06/2014    Dg Chest 2 View  02/06/2014   CLINICAL DATA:  Shortness of breath, dizziness, nausea and weakness. History of breast cancer.  EXAM: CHEST  2 VIEW  COMPARISON:  PA and lateral chest 12/09/2013 and 10/11/2013. CT chest 10/11/2013.  FINDINGS: There is marked cardiomegaly but no pulmonary edema. Coarsened pulmonary interstitium is compatible with mild fibrosis as seen on CT scan. No consolidative process, pneumothorax or effusion is identified.  IMPRESSION: Cardiomegaly and pulmonary fibrosis without acute disease.   Electronically Signed   By: Inge Rise M.D.   On: 02/06/2014 15:12   Ct Angio Chest Pe W/cm &/or Wo Cm  02/06/2014   CLINICAL DATA:  Dizziness, shortness of breath.  EXAM: CT ANGIOGRAPHY CHEST WITH CONTRAST  TECHNIQUE: Multidetector CT imaging of the chest was performed using the standard protocol during bolus administration of intravenous contrast. Multiplanar CT image reconstructions and MIPs were obtained to evaluate the vascular anatomy.  CONTRAST:  142mL OMNIPAQUE IOHEXOL 350 MG/ML SOLN  COMPARISON:  None.  FINDINGS: No filling defects in the pulmonary arteries to suggest pulmonary emboli. There is cardiomegaly. Diffuse coronary artery calcifications are present. There is a moderate-sized hiatal hernia. No mediastinal, hilar, or axillary  adenopathy. Chest wall soft tissues are unremarkable. Imaging into the upper abdomen shows no acute findings.  Peripheral coarsened interstitial opacities throughout the lungs compatible with fibrosis. No confluent opacities or effusions.  No acute bony abnormality or focal bone lesion.  Review of the MIP images confirms the above findings.  IMPRESSION: No evidence of pulmonary embolus.  Cardiomegaly, coronary artery disease.  Moderate-sized hiatal hernia.  Mild pulmonary fibrosis.   Electronically Signed   By: Rolm Baptise M.D.   On: 02/06/2014 20:13           Shanda Howells MD  Pager: [phone]

## 2014-02-06 NOTE — ED Notes (Signed)
CT called and made aware pt finished contrast.

## 2014-02-06 NOTE — ED Notes (Signed)
CT called to inform pt has appropriate IV for CT angio.

## 2014-02-06 NOTE — Progress Notes (Signed)
LCSW visited with family after RN made aware they had questions about Advanced Directive/DNR.  Patient and family have already completed AD and HCPOA.  Copies were made for family and DNR form was given for MD to sign and send with patient.  Patient is from [place] and has been there the last year.  Family reports positive experience and care and reports feeling relief with patient taken care of on a daily basis. Unclear of disposition for patient at this time.  Family reports at DC they would like her to return.    LCSW obtained copies of AD and HCPOA, gave to RN for papers to be scanned into chart and change code status to DNR.  Family denies other needs at this time.  Lane Hacker, MSW Clinical Social Work: Emergency Room [phone]

## 2014-02-06 NOTE — ED Provider Notes (Signed)
Patient initially seen and evaluated by Dr. Mingo Amber for dyspnea and case discussed with Dr. Candiss Norse who requested that CT angiogram before patient was to be admitted. CT angiogram shows no evidence of pulmonary embolism or occult pneumonia. Case is discussed with Dr. Ernestina Patches of [facility] agrees to come and evaluate the patient in the ED.    Results for orders placed or performed during the hospital encounter of 02/06/14  CBC  Result Value Ref Range   WBC 6.9 4.0 - 10.5 K/uL   RBC 3.68 (L) 3.87 - 5.11 MIL/uL   Hemoglobin 11.3 (L) 12.0 - 15.0 g/dL   HCT 34.7 (L) 36.0 - 46.0 %   MCV 94.3 78.0 - 100.0 fL   MCH 30.7 26.0 - 34.0 pg   MCHC 32.6 30.0 - 36.0 g/dL   RDW 15.1 11.5 - 15.5 %   Platelets 148 (L) 150 - 400 K/uL  Comprehensive metabolic panel  Result Value Ref Range   Sodium 137 135 - 145 mmol/L   Potassium 4.2 3.5 - 5.1 mmol/L   Chloride 101 96 - 112 mmol/L   CO2 27 19 - 32 mmol/L   Glucose, Bld 100 (H) 70 - 99 mg/dL   BUN 19 6 - 23 mg/dL   Creatinine, Ser 0.91 0.50 - 1.10 mg/dL   Calcium 8.8 8.4 - 10.5 mg/dL   Total Protein 6.1 6.0 - 8.3 g/dL   Albumin 3.6 3.5 - 5.2 g/dL   AST 27 0 - 37 U/L   ALT 14 0 - 35 U/L   Alkaline Phosphatase 54 39 - 117 U/L   Total Bilirubin 0.9 0.3 - 1.2 mg/dL   GFR calc non Af Amer 54 (L) >90 mL/min   GFR calc Af Amer 62 (L) >90 mL/min   Anion gap 9 5 - 15  Brain natriuretic peptide  Result Value Ref Range   B Natriuretic Peptide 613.8 (H) 0.0 - 100.0 pg/mL  I-stat troponin, ED  Result Value Ref Range   Troponin i, poc 0.01 0.00 - 0.08 ng/mL   Comment 3           Dg Chest 2 View  02/06/2014   CLINICAL DATA:  Shortness of breath, dizziness, nausea and weakness. History of breast cancer.  EXAM: CHEST  2 VIEW  COMPARISON:  PA and lateral chest 12/09/2013 and 10/11/2013. CT chest 10/11/2013.  FINDINGS: There is marked cardiomegaly but no pulmonary edema. Coarsened pulmonary interstitium is compatible with mild fibrosis as seen on CT scan. No  consolidative process, pneumothorax or effusion is identified.  IMPRESSION: Cardiomegaly and pulmonary fibrosis without acute disease.   Electronically Signed   By: Inge Rise M.D.   On: 02/06/2014 15:12   Ct Angio Chest Pe W/cm &/or Wo Cm  02/06/2014   CLINICAL DATA:  Dizziness, shortness of breath.  EXAM: CT ANGIOGRAPHY CHEST WITH CONTRAST  TECHNIQUE: Multidetector CT imaging of the chest was performed using the standard protocol during bolus administration of intravenous contrast. Multiplanar CT image reconstructions and MIPs were obtained to evaluate the vascular anatomy.  CONTRAST:  158mL OMNIPAQUE IOHEXOL 350 MG/ML SOLN  COMPARISON:  None.  FINDINGS: No filling defects in the pulmonary arteries to suggest pulmonary emboli. There is cardiomegaly. Diffuse coronary artery calcifications are present. There is a moderate-sized hiatal hernia. No mediastinal, hilar, or axillary adenopathy. Chest wall soft tissues are unremarkable. Imaging into the upper abdomen shows no acute findings.  Peripheral coarsened interstitial opacities throughout the lungs compatible with fibrosis. No confluent opacities or  effusions.  No acute bony abnormality or focal bone lesion.  Review of the MIP images confirms the above findings.  IMPRESSION: No evidence of pulmonary embolus.  Cardiomegaly, coronary artery disease.  Moderate-sized hiatal hernia.  Mild pulmonary fibrosis.   Electronically Signed   By: Rolm Baptise M.D.   On: 02/06/2014 20:13   Images viewed by me.   Delora Fuel, MD 41/28/78 [id number]

## 2014-02-06 NOTE — ED Notes (Signed)
Pt arrives via [facility] from [facility]. Pt has c/o SOB, dizziness, nausea and weakness. Pt. Denies chest pain, back pain or arm pain.

## 2014-02-07 DIAGNOSIS — F410 Panic disorder [episodic paroxysmal anxiety] without agoraphobia: Secondary | ICD-10-CM

## 2014-02-07 DIAGNOSIS — R0600 Dyspnea, unspecified: Secondary | ICD-10-CM

## 2014-02-07 DIAGNOSIS — J8410 Pulmonary fibrosis, unspecified: Secondary | ICD-10-CM

## 2014-02-07 DIAGNOSIS — I10 Essential (primary) hypertension: Secondary | ICD-10-CM

## 2014-02-07 LAB — CBC WITH DIFFERENTIAL/PLATELET
BASOS PCT: 0 % (ref 0–1)
Basophils Absolute: 0 10*3/uL (ref 0.0–0.1)
EOS ABS: 0.4 10*3/uL (ref 0.0–0.7)
EOS PCT: 5 % (ref 0–5)
HCT: 35.2 % — ABNORMAL LOW (ref 36.0–46.0)
Hemoglobin: 11.2 g/dL — ABNORMAL LOW (ref 12.0–15.0)
LYMPHS PCT: 14 % (ref 12–46)
Lymphs Abs: 1 10*3/uL (ref 0.7–4.0)
MCH: 30.9 pg (ref 26.0–34.0)
MCHC: 31.8 g/dL (ref 30.0–36.0)
MCV: 97.2 fL (ref 78.0–100.0)
MONO ABS: 0.5 10*3/uL (ref 0.1–1.0)
Monocytes Relative: 6 % (ref 3–12)
NEUTROS PCT: 75 % (ref 43–77)
Neutro Abs: 5.3 10*3/uL (ref 1.7–7.7)
Platelets: 146 10*3/uL — ABNORMAL LOW (ref 150–400)
RBC: 3.62 MIL/uL — AB (ref 3.87–5.11)
RDW: 15.2 % (ref 11.5–15.5)
WBC: 7.1 10*3/uL (ref 4.0–10.5)

## 2014-02-07 LAB — COMPREHENSIVE METABOLIC PANEL
ALK PHOS: 53 U/L (ref 39–117)
ALT: 15 U/L (ref 0–35)
AST: 24 U/L (ref 0–37)
Albumin: 3.4 g/dL — ABNORMAL LOW (ref 3.5–5.2)
Anion gap: 8 (ref 5–15)
BUN: 19 mg/dL (ref 6–23)
CO2: 30 mmol/L (ref 19–32)
Calcium: 8.5 mg/dL (ref 8.4–10.5)
Chloride: 100 mmol/L (ref 96–112)
Creatinine, Ser: 1.03 mg/dL (ref 0.50–1.10)
GFR calc non Af Amer: 46 mL/min — ABNORMAL LOW (ref >90)
GFR, EST AFRICAN AMERICAN: 53 mL/min — AB (ref >90)
GLUCOSE: 113 mg/dL — AB (ref 70–99)
POTASSIUM: 4.2 mmol/L (ref 3.5–5.1)
SODIUM: 138 mmol/L (ref 135–145)
Total Bilirubin: 0.6 mg/dL (ref 0.3–1.2)
Total Protein: 6.2 g/dL (ref 6.0–8.3)

## 2014-02-07 LAB — MRSA PCR SCREENING: MRSA by PCR: NEGATIVE

## 2014-02-07 MED ORDER — BUSPIRONE HCL 5 MG PO TABS
5.0000 mg | ORAL_TABLET | Freq: Two times a day (BID) | ORAL | Status: DC
  Filled 2014-02-07 (×2): qty 1

## 2014-02-07 MED ORDER — PREDNISONE 5 MG PO TABS: 5.0000 mg | ORAL_TABLET | Freq: Every day | ORAL | Status: DC

## 2014-02-07 MED ORDER — BUSPIRONE HCL 5 MG PO TABS: 5.0000 mg | ORAL_TABLET | Freq: Two times a day (BID) | ORAL | Status: DC

## 2014-02-07 MED ORDER — ALPRAZOLAM 0.5 MG PO TABS: 0.5000 mg | ORAL_TABLET | Freq: Two times a day (BID) | ORAL | Status: AC | PRN

## 2014-02-07 NOTE — Progress Notes (Signed)
CSW (Clinical Education officer, museum) sent [employer] paperwork to facility. They confirmed pt can dc back. CSW provided pt daughter with dc packet. Nursing staff aware that CSW signing off.  [place], [place]

## 2014-02-07 NOTE — Progress Notes (Signed)
Report given [facility].

## 2014-02-07 NOTE — Progress Notes (Signed)
SATURATION    Patient Saturations on Room Air at Rest = 96  Patient Saturations on [employer] while Ambulating =93

## 2014-02-07 NOTE — Progress Notes (Signed)
UR completed 

## 2014-02-07 NOTE — Clinical Social Work Psychosocial (Signed)
     Clinical Social Work Department BRIEF PSYCHOSOCIAL ASSESSMENT 02/07/2014  Patient:  [name], [name]     Account Number:  [id number]     Admit date:  02/06/2014  Clinical Social Worker:  Adair Laundry  Date/Time:  02/07/2014 10:58 AM  Referred by:  Physician  Date Referred:  02/07/2014 Referred for  ALF Placement   Other Referral:   Interview type:  Family Other interview type:    PSYCHOSOCIAL DATA Living Status:  FACILITY Admitted from facility:  [facility] Level of care:  Assisted Living Primary support name:  [name] Primary support relationship to patient:  CHILD, ADULT Degree of support available:   Pt has strong family and facility support    CURRENT CONCERNS Current Concerns  Post-Acute Placement   Other Concerns:    SOCIAL WORK ASSESSMENT / PLAN CSW notified pt was admitted from facility. CSW viisted pt room and pt son and daughter at bedside. Pt present for assessment but did not participate in discussion. CSW primarly spoke with pt daughter [name]. [name] confirmed pt was admitted from [facility]. Pt has been at facility for about a year now and family expresses that they are very pleased with care. They would very much want pt to return to ALF at discharge and are hoping this admission is nothing that will cause a change. CSW explained CSW role in checking with facility to confirm pt can dc back. CSW did inform family that per hospital nursing report pt looks appropriate for ALF level of care so likely there will not be any issues. [name] informed CSW that pt is a DNR and ED CSW provided with GOLD DNR form that needs to be signed. CSW agreed to notify MD and alert nursing staff that pt is a DNR as this is currently not showing up in the system. [name] thankful for assistance. CSW confirmed that family will be driving pt back to facility and notified them of potential for return today.   Assessment/plan status:  Psychosocial Support/Ongoing Assessment of  Needs Other assessment/ plan:   Information/referral to community resources:   None needed at this time    PATIENTS/FAMILYS RESPONSE TO PLAN OF CARE: Pt family very pleasant and coopeartive. They have no concerns at this time and are happy that pt could potentially return to facility today.    [place], [place]

## 2014-02-07 NOTE — Progress Notes (Signed)
Pt. Alert and oriented. Arrived to the unit from ED via stretcher. Family at bedside. No s/s of distress or discomfort noted. Pt. Denies pain. Pt. Placed on telemetry monitor. CCMD notified, rhythm confirmed. Pt. Oriented to room. Call light placed in reach. RN will continue to monitor pt. For changes in condition. Olivya Sobol, Katherine Roan

## 2014-02-07 NOTE — Discharge Summary (Signed)
Physician Discharge Summary  [name] PXT:[id number] DOB: 03-20-1920 DOA: 02/06/2014  PCP: Joycelyn Man, MD  Admit date: 02/06/2014 Discharge date: 02/07/2014  Time spent: 25 minutes  Recommendations for Outpatient Follow-up:  1. Follow up with PCP in 1-2 weeks 2. Consider further work up for possible panic attacks 3. Pt is DNR  Discharge Diagnoses:  Active Problems:   Essential hypertension   PULMONARY FIBROSIS ILD POST INFLAMMATORY CHRONIC   Dyspnea   Panic attack   Discharge Condition: Stable, improved  Diet recommendation: Heart healthy  Filed Weights   02/06/14 1251 02/06/14 2244 02/07/14 0642  Weight: 81.647 kg (180 lb) 81.33 kg (179 lb 4.8 oz) 80.377 kg (177 lb 3.2 oz)    History of present illness:  Please see admit h and p from 2/4 for details. Briefly, pt presents dyspnea in the setting of chronic pulmonary fibrosis. CT in ED confirmed fibrosis. No other acute intra-pulmonary process was identified. Patient was admitted for further workup.   Hospital Course:  On the floor, the patient was started on IV steroids and empiric lasix started although pt did appear clinically euvolemic on presentation. By the following morning, the patient returned to her baseline state. On further discussion with family in the room, the patient was noted to have prior panic attack episodes in the AM, which resulted in feelings of worsening dyspnea. O2 sats remained appropriate during this entire course, of note. The patient remained stable and will be discharged with a brief steroid taper. Dyspnea is likely not related to heart failure. Will give a trial of low dose buspar with PRN xanax for panic attack.  Consultations:  none  Discharge Exam: Filed Vitals:   02/06/14 2200 02/06/14 2244 02/07/14 0136 02/07/14 0642  BP: 120/86 146/90 130/65   Pulse: 71 80 75   Temp:  98 F (36.7 C) 97.9 F (36.6 C) 97.4 F (36.3 C)  TempSrc:  Oral Oral Oral  Resp: 12 18 18 18   Height:   5\' 5"  (1.651 m)    Weight:  81.33 kg (179 lb 4.8 oz)  80.377 kg (177 lb 3.2 oz)  SpO2: 95% 96% 95% 96%    General: awake, in nad Cardiovascular: regular, s1, s2 Respiratory: normal resp effort, no wheezing  Discharge Instructions     Medication List    TAKE these medications        acetaminophen 325 MG tablet  Commonly known as:  TYLENOL  Take 650 mg by mouth every 6 (six) hours as needed for pain.     ALPRAZolam 0.5 MG tablet  Commonly known as:  XANAX  Take 1 tablet (0.5 mg total) by mouth 2 (two) times daily as needed for anxiety (for panic attack).     AZOPT 1 % ophthalmic suspension  Generic drug:  brinzolamide  Place 1 drop into both eyes 2 (two) times daily.     bimatoprost 0.01 % Soln  Commonly known as:  LUMIGAN  Place 1 drop into both eyes at bedtime.     busPIRone 5 MG tablet  Commonly known as:  BUSPAR  Take 1 tablet (5 mg total) by mouth 2 (two) times daily.     celecoxib 200 MG capsule  Commonly known as:  CELEBREX  Take 200 mg by mouth daily.     citalopram 10 MG tablet  Commonly known as:  CELEXA  Take 1 tablet (10 mg total) by mouth daily.     clopidogrel 75 MG tablet  Commonly known as:  PLAVIX  Take 1 tablet (75 mg total) by mouth daily.     donepezil 5 MG tablet  Commonly known as:  ARICEPT  Take 1 tablet (5 mg total) by mouth at bedtime.     fluocinonide cream 0.05 %  Commonly known as:  LIDEX  Apply 1 application topically as needed.     furosemide 40 MG tablet  Commonly known as:  LASIX  Take 1 tablet (40 mg total) by mouth daily. Add to 20 mg to make 60 mg     furosemide 20 MG tablet  Commonly known as:  LASIX  Take 1 tablet (20 mg total) by mouth daily. to the 40 mg to make 60 mg     metoprolol succinate 25 MG 24 hr tablet  Commonly known as:  TOPROL-XL  TAKE 1 TABLET BY MOUTH ONCE A DAY.     multivitamin with minerals Tabs tablet  Take 1 tablet by mouth daily.     nitroGLYCERIN 0.4 MG SL tablet  Commonly known as:   NITROSTAT  Place 1 tablet (0.4 mg total) under the tongue every 5 (five) minutes as needed for chest pain (up to 3 doses).     potassium chloride SA 20 MEQ tablet  Commonly known as:  K-DUR,KLOR-CON  TAKE 2 TABLETS BY MOUTH ONCE DAILY.     predniSONE 5 MG tablet  Commonly known as:  DELTASONE  Take 1 tablet (5 mg total) by mouth daily with breakfast.     sodium chloride 2 % ophthalmic solution  Commonly known as:  MURO 128  Place 1 drop into the left eye.     SYSTANE ULTRA OP  Apply 1 drop to eye 4 (four) times daily as needed (dry eyes).     THERAPEUTIC M PO  Take 130 mg by mouth daily.       Allergies  Allergen Reactions  . Codeine Nausea And Vomiting    REACTION: Stomach cramps  . Indomethacin Other (See Comments)    REACTION: TIA type episodes  . Penicillins Nausea And Vomiting    REACTION: vomit  . Sulfonamide Derivatives Nausea And Vomiting    REACTION: Nausea Vomiting   Follow-up Information    Follow up with TODD,JEFFREY ALLEN, MD. Schedule an appointment as soon as possible for a visit in 1 week.   Specialty:  Family Medicine   Contact information:   [place] [address] [phone]        The results of significant diagnostics from this hospitalization (including imaging, microbiology, ancillary and laboratory) are listed below for reference.    Significant Diagnostic Studies: Dg Chest 2 View  02/06/2014   CLINICAL DATA:  Shortness of breath, dizziness, nausea and weakness. History of breast cancer.  EXAM: CHEST  2 VIEW  COMPARISON:  PA and lateral chest 12/09/2013 and 10/11/2013. CT chest 10/11/2013.  FINDINGS: There is marked cardiomegaly but no pulmonary edema. Coarsened pulmonary interstitium is compatible with mild fibrosis as seen on CT scan. No consolidative process, pneumothorax or effusion is identified.  IMPRESSION: Cardiomegaly and pulmonary fibrosis without acute disease.   Electronically Signed   By: Inge Rise  M.D.   On: 02/06/2014 15:12   Ct Angio Chest Pe W/cm &/or Wo Cm  02/06/2014   CLINICAL DATA:  Dizziness, shortness of breath.  EXAM: CT ANGIOGRAPHY CHEST WITH CONTRAST  TECHNIQUE: Multidetector CT imaging of the chest was performed using the standard protocol during bolus administration of intravenous contrast. Multiplanar CT image reconstructions and MIPs were  obtained to evaluate the vascular anatomy.  CONTRAST:  115mL OMNIPAQUE IOHEXOL 350 MG/ML SOLN  COMPARISON:  None.  FINDINGS: No filling defects in the pulmonary arteries to suggest pulmonary emboli. There is cardiomegaly. Diffuse coronary artery calcifications are present. There is a moderate-sized hiatal hernia. No mediastinal, hilar, or axillary adenopathy. Chest wall soft tissues are unremarkable. Imaging into the upper abdomen shows no acute findings.  Peripheral coarsened interstitial opacities throughout the lungs compatible with fibrosis. No confluent opacities or effusions.  No acute bony abnormality or focal bone lesion.  Review of the MIP images confirms the above findings.  IMPRESSION: No evidence of pulmonary embolus.  Cardiomegaly, coronary artery disease.  Moderate-sized hiatal hernia.  Mild pulmonary fibrosis.   Electronically Signed   By: Rolm Baptise M.D.   On: 02/06/2014 20:13    Microbiology: Recent Results (from the past 240 hour(s))  MRSA PCR Screening     Status: None   Collection Time: 02/07/14  2:46 AM  Result Value Ref Range Status   MRSA by PCR NEGATIVE NEGATIVE Final    Comment:        The GeneXpert MRSA Assay (FDA approved for NASAL specimens only), is one component of a comprehensive MRSA colonization surveillance program. It is not intended to diagnose MRSA infection nor to guide or monitor treatment for MRSA infections.      Labs: Basic Metabolic Panel:  Recent Labs Lab 02/06/14 1340 02/07/14 0326  NA 137 138  K 4.2 4.2  CL 101 100  CO2 27 30  GLUCOSE 100* 113*  BUN 19 19  CREATININE 0.91  1.03  CALCIUM 8.8 8.5   Liver Function Tests:  Recent Labs Lab 02/06/14 1340 02/07/14 0326  AST 27 24  ALT 14 15  ALKPHOS 54 53  BILITOT 0.9 0.6  PROT 6.1 6.2  ALBUMIN 3.6 3.4*   No results for input(s): LIPASE, AMYLASE in the last 168 hours. No results for input(s): AMMONIA in the last 168 hours. CBC:  Recent Labs Lab 02/06/14 1340 02/07/14 0326  WBC 6.9 7.1  NEUTROABS  --  5.3  HGB 11.3* 11.2*  HCT 34.7* 35.2*  MCV 94.3 97.2  PLT 148* 146*   Cardiac Enzymes: No results for input(s): CKTOTAL, CKMB, CKMBINDEX, TROPONINI in the last 168 hours. BNP: BNP (last 3 results)  Recent Labs  02/06/14 1340  BNP 613.8*    ProBNP (last 3 results)  Recent Labs  12/09/13 1224 01/20/14 1559 01/30/14 1414  PROBNP 4150.0* 869.0* 2363.00*    CBG: No results for input(s): GLUCAP in the last 168 hours.   Signed:  Asmaa Tirpak K  [facility] 02/07/2014, 1:22 PM

## 2014-02-10 ENCOUNTER — Telehealth: Payer: Self-pay | Admitting: Cardiology

## 2014-02-10 NOTE — Telephone Encounter (Signed)
Phys Therapist calling advised that patient was in ED 2/4. He had gone to house to check on her. She'd had episode of SOB and near-syncope @ home, he called EMS. Was treated w/ IV steroids, admitted & observed overnight, d/c'ed the next day.  She has 4 week f/u w/ Hochrein on 2/28.  He called also to update on PT progress, she is 3-4 weeks away from returning to functional independence. Advised OK to continue PT @ home.

## 2014-02-24 ENCOUNTER — Other Ambulatory Visit: Payer: Self-pay | Admitting: Cardiology

## 2014-02-28 ENCOUNTER — Encounter: Payer: Self-pay | Admitting: Cardiology

## 2014-02-28 ENCOUNTER — Ambulatory Visit (INDEPENDENT_AMBULATORY_CARE_PROVIDER_SITE_OTHER): Payer: Medicare Other | Admitting: Cardiology

## 2014-02-28 VITALS — BP 120/80 | HR 76 | Ht 65.0 in | Wt 182.5 lb

## 2014-02-28 DIAGNOSIS — I10 Essential (primary) hypertension: Secondary | ICD-10-CM

## 2014-02-28 DIAGNOSIS — I420 Dilated cardiomyopathy: Secondary | ICD-10-CM

## 2014-02-28 DIAGNOSIS — I2510 Atherosclerotic heart disease of native coronary artery without angina pectoris: Secondary | ICD-10-CM

## 2014-02-28 NOTE — Progress Notes (Signed)
HPI  The patient presents for follow-up after being in the hospital recently.   I reviewed these records.   She seems like she might be having panic attacks and was actually started on BuSpar and given Xanax to take when necessary. She did have a mildly elevated BNP during her recent hospital. However, she's not having any overt symptoms now. She has chronic dyspnea. She's not describing PND or orthopnea. Weights have been stable. She's having less lower extremity swelling and she is wearing compression stockings. They're watching her salt. Her daughter is quite concerned about her worsening memory.    Allergies  Allergen Reactions  . Codeine Nausea And Vomiting    REACTION: Stomach cramps  . Indomethacin Other (See Comments)    REACTION: TIA type episodes  . Penicillins Nausea And Vomiting    REACTION: vomit  . Sulfonamide Derivatives Nausea And Vomiting    REACTION: Nausea Vomiting    Current Outpatient Prescriptions  Medication Sig Dispense Refill  . acetaminophen (TYLENOL) 325 MG tablet Take 650 mg by mouth every 6 (six) hours as needed for pain.    Marland Kitchen ALPRAZolam (XANAX) 0.5 MG tablet Take 1 tablet (0.5 mg total) by mouth 2 (two) times daily as needed for anxiety (for panic attack). 30 tablet 0  . AZOPT 1 % ophthalmic suspension Place 1 drop into both eyes 2 (two) times daily.     . bimatoprost (LUMIGAN) 0.01 % SOLN Place 1 drop into both eyes at bedtime.    . busPIRone (BUSPAR) 5 MG tablet Take 1 tablet (5 mg total) by mouth 2 (two) times daily. 60 tablet 0  . celecoxib (CELEBREX) 200 MG capsule Take 200 mg by mouth daily.    . citalopram (CELEXA) 10 MG tablet Take 1 tablet (10 mg total) by mouth daily. 100 tablet 3  . clopidogrel (PLAVIX) 75 MG tablet Take 1 tablet (75 mg total) by mouth daily. 100 tablet 3  . donepezil (ARICEPT) 5 MG tablet Take 1 tablet (5 mg total) by mouth at bedtime. (Patient taking differently: Take 5 mg by mouth 3 (three) times a week. Monday, Wednesday,  and Friday) 100 tablet 3  . fluocinonide cream (LIDEX) 4.12 % Apply 1 application topically as needed.     . furosemide (LASIX) 20 MG tablet Take 1 tablet (20 mg total) by mouth daily. to the 40 mg to make 60 mg 30 tablet 9  . furosemide (LASIX) 40 MG tablet TAKE 1 TABLET BY MOUTH ONCE DAILY ALONG WITH 20MG  TABLET TO = 60 MG. 30 tablet 0  . metoprolol succinate (TOPROL-XL) 25 MG 24 hr tablet TAKE 1 TABLET BY MOUTH ONCE A DAY. 30 tablet 11  . nitroGLYCERIN (NITROSTAT) 0.4 MG SL tablet Place 1 tablet (0.4 mg total) under the tongue every 5 (five) minutes as needed for chest pain (up to 3 doses). 25 tablet 3  . Polyethyl Glycol-Propyl Glycol (SYSTANE ULTRA OP) Apply 1 drop to eye 4 (four) times daily as needed (dry eyes).    . potassium chloride SA (K-DUR,KLOR-CON) 20 MEQ tablet TAKE 2 TABLETS BY MOUTH ONCE DAILY. 30 tablet 5  . sodium chloride (MURO 128) 2 % ophthalmic solution Place 1 drop into the left eye.    . Multiple Vitamin (MULTIVITAMIN WITH MINERALS) TABS tablet Take 1 tablet by mouth daily.    . Multiple Vitamins-Minerals (THERAPEUTIC M PO) Take 130 mg by mouth daily.     No current facility-administered medications for this visit.    Past Medical  History  Diagnosis Date  . Breast cancer     lumpectomy followed by RT and Tamoxifen x 5 years  . Hypertension   . CAD (coronary artery disease)     a. Mod nonobstructive by cath 2006. b. stress test 2007- no evidence of ischemia; EF 58%  . Dermatitis   . Female stress incontinence   . Seborrheic keratosis, inflamed   . Pulmonary fibrosis   . Hearing loss   . Erosive esophagitis 05/19/10  . Hiatal hernia 05/19/10    5 cm  . Anemia   . Diverticulosis 05/19/10    moderate  . Anxiety   . Colon cancer     a. 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 Tx with Xeloda.  . Stricture esophagus 06/08/10    EGD  . Gastritis   . NSTEMI (non-ST elevated myocardial  infarction)     [place]  . Premature atrial contractions   . Pulmonary fibrosis     Past Surgical History  Procedure Laterality Date  . Breast lumpectomy    . Shoulder arthroscopy w/ acromial repair Bilateral   . Appendectomy    . Colon resection  05/20/2010    [facility]  . Total abdominal hysterectomy w/ bilateral salpingoophorectomy    . Cardiac catheterization  2006    40% ostial LAD, 60% mid LAD followed by 30-40% disease, 50% just after D2, 60-70% D2 lesion followed by 50% disease, normal LCx/RCA; EF 55%  . Tonsillectomy    . Colon surgery    . Total abdominal hysterectomy    . Dilation and curettage of uterus    . Cataract extraction, bilateral Bilateral     ROS:  As stated in the HPI and negative for all other systems.  PHYSICAL EXAM BP 120/80 mmHg  Pulse 76  Ht 5\' 5"  (1.651 m)  Wt 182 lb 8 oz (82.781 kg)  BMI 30.37 kg/m2 GENERAL:  Frail but in no distress NECK:  No palpitations, waveform within normal limits, carotid upstroke brisk and symmetric, no bruits, no thyromegaly LUNGS:  Clear to auscultation bilaterally BACK:  No CVA tenderness, lordosis HEART:  PMI not displaced or sustained,S1 and S2 within normal limits, no S3, no S4, no clicks, no rubs, no murmurs ABD:  Flat, positive bowel sounds normal in frequency in pitch, no bruits, no rebound, no guarding, no midline pulsatile mass, no hepatomegaly, no splenomegaly EXT:  2 plus pulses throughout, no edema, no cyanosis no clubbing NEURO:  Nonfocal but pleasantly confused.    ASSESSMENT AND PLAN  ACUTE ON CHRONIC DIASTOLIC HF:  She seems to be euvolemic.  No change in therapy is indicated.   NSTEMI/SOB:  Patient's had no acute anginal symptoms.  HTN:  Her BP is well controlled.  She will continue the meds as listed.  CAD:  This has been mild as and we are managing this conservatively as above.   MEMORY:  I discussed with her daughter possible plans to assess this and her multiple  medications.

## 2014-02-28 NOTE — Patient Instructions (Addendum)
Your physician recommends that you schedule a follow-up appointment in: 6 months with Dr. Hochrein  

## 2014-03-03 ENCOUNTER — Telehealth: Payer: Self-pay | Admitting: Cardiology

## 2014-03-03 NOTE — Telephone Encounter (Signed)
New Message  Home health PT called asking about medications (per pt- potassium perhaps (2xday versus everyother)), Wanted to speak w/ Rn about pt condition. Please call back and discuss.

## 2014-03-04 ENCOUNTER — Encounter: Payer: Self-pay | Admitting: *Deleted

## 2014-03-04 NOTE — Telephone Encounter (Signed)
Order sent to split the doses one in am and one in pm

## 2014-03-10 ENCOUNTER — Other Ambulatory Visit: Payer: Self-pay | Admitting: Family Medicine

## 2014-03-11 ENCOUNTER — Ambulatory Visit: Payer: Medicare Other | Admitting: Cardiology

## 2014-03-13 ENCOUNTER — Encounter: Payer: Self-pay | Admitting: Family Medicine

## 2014-03-13 ENCOUNTER — Ambulatory Visit (INDEPENDENT_AMBULATORY_CARE_PROVIDER_SITE_OTHER): Payer: Medicare Other | Admitting: Family Medicine

## 2014-03-13 ENCOUNTER — Telehealth: Payer: Self-pay | Admitting: Family Medicine

## 2014-03-13 VITALS — BP 120/78 | HR 86 | Temp 97.3°F

## 2014-03-13 DIAGNOSIS — I420 Dilated cardiomyopathy: Secondary | ICD-10-CM

## 2014-03-13 DIAGNOSIS — F410 Panic disorder [episodic paroxysmal anxiety] without agoraphobia: Secondary | ICD-10-CM

## 2014-03-13 MED ORDER — CITALOPRAM HYDROBROMIDE 20 MG PO TABS: 20.0000 mg | ORAL_TABLET | Freq: Every day | ORAL | Status: AC

## 2014-03-13 NOTE — Telephone Encounter (Signed)
Pt daughter said her and doctor had a conversation about long term care.about her Mom this morning. Daughter said Dr Sherren Mocha told her he would write a letter and pt is calling back today saying she need Dr todd to write the letter. Daughter said the letter could be addressed to her with what ever he was going to say.

## 2014-03-13 NOTE — Patient Instructions (Signed)
Stop the Aricept  Stop the BuSpar  Increase the Celexa to 20 mg daily  Xanax.......Marland Kitchen 1 stat....... when necessary for panic attack

## 2014-03-13 NOTE — Progress Notes (Signed)
Pre visit review using our clinic review tool, if applicable. No additional management support is needed unless otherwise documented below in the visit note. 

## 2014-03-13 NOTE — Progress Notes (Signed)
   Subjective:    Patient ID: [name], female    DOB: May 23, 1920, 81 y.o.   MRN: [id number]  HPI [name] is a 81 year old widowed female who resides at [facility] who requires total care. She needs help with bathing dressing eating and she has to be given all her medications.  She's had 3 or 4 visits the emergency room this past winter for shortness of breath. They've determined that the majority the episodes are related to panic attacks. They gave her BuSpar and Xanax. Now she's having side effects and the BuSpar fatigue and nausea.  She's had numerous cardiac evaluations. Her diastolic dysfunction is stable. Dr. Lenore Cordia has outlined an excellent cardiac program.   Review of Systems    review of systems otherwise negative Objective:   Physical Exam  Well-developed well-nourished female no acute distress vital signs stable she's afebrile BP today 124/78 pulse 70 and regular pulse ox 91% on room air      Assessment & Plan:  Diastolic dysfunction stable..... Continue cardiac treatment program  Anxiety........ stop the BuSpar..... Increase the Celexa.......Marland Kitchen Xanax when necessary  Dementia,.... no improvement at all with the Aricept..... Stop it  At this point she needs basic comfort care and full-term care. The daughter tells me that her long-term insurance is not covering any of her care. I advised to see an attorney

## 2014-03-18 NOTE — Telephone Encounter (Signed)
Letter ready for pick up and Left message on machine for daughter

## 2014-03-18 NOTE — Telephone Encounter (Signed)
Left message on machine for daughter to call back with details on what exactly she would like the letter to state.  Okay per Dr Sherren Mocha

## 2014-03-20 ENCOUNTER — Other Ambulatory Visit: Payer: Self-pay | Admitting: Cardiology

## 2014-03-26 ENCOUNTER — Other Ambulatory Visit: Payer: Self-pay | Admitting: Cardiology

## 2014-03-26 ENCOUNTER — Telehealth: Payer: Self-pay | Admitting: Internal Medicine

## 2014-03-26 NOTE — Telephone Encounter (Signed)
Colon cancer resected in 2012. Will ned to evaluate either in ED or in the office depending on the availability.

## 2014-03-26 NOTE — Telephone Encounter (Signed)
Spoke with patient's daughter. She states her mother was at her house on Saturday and had a loose stool. She gave her Imodium and she was better. Patient told daughter she had loose stools x 2 weeks but daughter is not sure mother remembers correctly. Today, her mother called and told her she had blood in toilet, on seat and tissue. Patient has hx colon cancer. No extender visit available today or tomorrow. No MD appointments tomorrow. Patients daughter will call PCP or take patient to ED for evaluation.

## 2014-03-26 NOTE — Telephone Encounter (Signed)
Rx has been sent to the pharmacy electronically. ° °

## 2014-03-27 NOTE — Telephone Encounter (Signed)
Patient's daughter called back. She states she has had her mother's caregivers to watch for any bleeding. They have not seen any more bleeding. She is really not sure if her mother saw blood. She states they are in "holding pattern" now and watching her. She will call back to schedule OV at later date.

## 2014-03-27 NOTE — Telephone Encounter (Signed)
Left a message for patient's daughter to call back with update on patient.

## 2014-03-31 ENCOUNTER — Encounter (HOSPITAL_COMMUNITY): Payer: Self-pay | Admitting: Neurology

## 2014-03-31 ENCOUNTER — Observation Stay (HOSPITAL_COMMUNITY): Payer: Medicare Other

## 2014-03-31 ENCOUNTER — Observation Stay (HOSPITAL_COMMUNITY)
Admission: EM | Admit: 2014-03-31 | Discharge: 2014-04-02 | Disposition: A | Discharge status: Skilled Nursing Facility | Payer: Medicare Other | Attending: Internal Medicine | Admitting: Internal Medicine

## 2014-03-31 DIAGNOSIS — Z87891 Personal history of nicotine dependence: Secondary | ICD-10-CM | POA: Diagnosis not present

## 2014-03-31 DIAGNOSIS — Z85038 Personal history of other malignant neoplasm of large intestine: Secondary | ICD-10-CM | POA: Insufficient documentation

## 2014-03-31 DIAGNOSIS — R739 Hyperglycemia, unspecified: Secondary | ICD-10-CM | POA: Insufficient documentation

## 2014-03-31 DIAGNOSIS — K449 Diaphragmatic hernia without obstruction or gangrene: Secondary | ICD-10-CM | POA: Insufficient documentation

## 2014-03-31 DIAGNOSIS — F419 Anxiety disorder, unspecified: Secondary | ICD-10-CM | POA: Insufficient documentation

## 2014-03-31 DIAGNOSIS — F329 Major depressive disorder, single episode, unspecified: Secondary | ICD-10-CM | POA: Diagnosis not present

## 2014-03-31 DIAGNOSIS — Z853 Personal history of malignant neoplasm of breast: Secondary | ICD-10-CM | POA: Insufficient documentation

## 2014-03-31 DIAGNOSIS — Z885 Allergy status to narcotic agent status: Secondary | ICD-10-CM | POA: Diagnosis not present

## 2014-03-31 DIAGNOSIS — F0390 Unspecified dementia without behavioral disturbance: Secondary | ICD-10-CM | POA: Insufficient documentation

## 2014-03-31 DIAGNOSIS — Z79899 Other long term (current) drug therapy: Secondary | ICD-10-CM | POA: Diagnosis not present

## 2014-03-31 DIAGNOSIS — K5790 Diverticulosis of intestine, part unspecified, without perforation or abscess without bleeding: Secondary | ICD-10-CM | POA: Diagnosis not present

## 2014-03-31 DIAGNOSIS — K625 Hemorrhage of anus and rectum: Principal | ICD-10-CM | POA: Diagnosis present

## 2014-03-31 DIAGNOSIS — D649 Anemia, unspecified: Secondary | ICD-10-CM

## 2014-03-31 DIAGNOSIS — Z9049 Acquired absence of other specified parts of digestive tract: Secondary | ICD-10-CM | POA: Diagnosis not present

## 2014-03-31 DIAGNOSIS — Z882 Allergy status to sulfonamides status: Secondary | ICD-10-CM | POA: Insufficient documentation

## 2014-03-31 DIAGNOSIS — Z7902 Long term (current) use of antithrombotics/antiplatelets: Secondary | ICD-10-CM | POA: Diagnosis not present

## 2014-03-31 DIAGNOSIS — Z888 Allergy status to other drugs, medicaments and biological substances status: Secondary | ICD-10-CM | POA: Diagnosis not present

## 2014-03-31 DIAGNOSIS — J8410 Pulmonary fibrosis, unspecified: Secondary | ICD-10-CM | POA: Insufficient documentation

## 2014-03-31 DIAGNOSIS — Z880 Allergy status to penicillin: Secondary | ICD-10-CM | POA: Diagnosis not present

## 2014-03-31 DIAGNOSIS — I5032 Chronic diastolic (congestive) heart failure: Secondary | ICD-10-CM | POA: Insufficient documentation

## 2014-03-31 DIAGNOSIS — I2510 Atherosclerotic heart disease of native coronary artery without angina pectoris: Secondary | ICD-10-CM | POA: Insufficient documentation

## 2014-03-31 DIAGNOSIS — R197 Diarrhea, unspecified: Secondary | ICD-10-CM | POA: Insufficient documentation

## 2014-03-31 DIAGNOSIS — H409 Unspecified glaucoma: Secondary | ICD-10-CM | POA: Insufficient documentation

## 2014-03-31 DIAGNOSIS — E871 Hypo-osmolality and hyponatremia: Secondary | ICD-10-CM | POA: Diagnosis not present

## 2014-03-31 DIAGNOSIS — I10 Essential (primary) hypertension: Secondary | ICD-10-CM | POA: Insufficient documentation

## 2014-03-31 DIAGNOSIS — I252 Old myocardial infarction: Secondary | ICD-10-CM | POA: Insufficient documentation

## 2014-03-31 LAB — COMPREHENSIVE METABOLIC PANEL
ALBUMIN: 3.3 g/dL — AB (ref 3.5–5.2)
ALT: 12 U/L (ref 0–35)
AST: 25 U/L (ref 0–37)
Alkaline Phosphatase: 63 U/L (ref 39–117)
Anion gap: 8 (ref 5–15)
BUN: 29 mg/dL — ABNORMAL HIGH (ref 6–23)
CALCIUM: 8.6 mg/dL (ref 8.4–10.5)
CO2: 25 mmol/L (ref 19–32)
Chloride: 100 mmol/L (ref 96–112)
Creatinine, Ser: 1.04 mg/dL (ref 0.50–1.10)
GFR calc non Af Amer: 46 mL/min — ABNORMAL LOW (ref >90)
GFR, EST AFRICAN AMERICAN: 53 mL/min — AB (ref >90)
GLUCOSE: 116 mg/dL — AB (ref 70–99)
POTASSIUM: 4.4 mmol/L (ref 3.5–5.1)
SODIUM: 133 mmol/L — AB (ref 135–145)
Total Bilirubin: 0.3 mg/dL (ref 0.3–1.2)
Total Protein: 6.5 g/dL (ref 6.0–8.3)

## 2014-03-31 LAB — I-STAT TROPONIN, ED: TROPONIN I, POC: 0.01 ng/mL (ref 0.00–0.08)

## 2014-03-31 LAB — CBC
HEMATOCRIT: 33.5 % — AB (ref 36.0–46.0)
HEMOGLOBIN: 10.8 g/dL — AB (ref 12.0–15.0)
MCH: 30.7 pg (ref 26.0–34.0)
MCHC: 32.2 g/dL (ref 30.0–36.0)
MCV: 95.2 fL (ref 78.0–100.0)
Platelets: 223 10*3/uL (ref 150–400)
RBC: 3.52 MIL/uL — ABNORMAL LOW (ref 3.87–5.11)
RDW: 16.4 % — ABNORMAL HIGH (ref 11.5–15.5)
WBC: 9.4 10*3/uL (ref 4.0–10.5)

## 2014-03-31 LAB — TYPE AND SCREEN
ABO/RH(D): O POS
ANTIBODY SCREEN: NEGATIVE

## 2014-03-31 LAB — POC OCCULT BLOOD, ED: Fecal Occult Bld: POSITIVE — AB

## 2014-03-31 MED ORDER — LATANOPROST 0.005 % OP SOLN
1.0000 [drp] | Freq: Every day | OPHTHALMIC | Status: DC
  Administered 2014-04-01: 1 [drp] via OPHTHALMIC
  Filled 2014-03-31: qty 2.5

## 2014-03-31 MED ORDER — FUROSEMIDE 40 MG PO TABS
60.0000 mg | ORAL_TABLET | Freq: Every day | ORAL | Status: DC
Start: 2014-04-01 — End: 2014-04-02
  Administered 2014-04-01 – 2014-04-02 (×2): 60 mg via ORAL
  Filled 2014-03-31 (×2): qty 1

## 2014-03-31 MED ORDER — METOPROLOL SUCCINATE ER 25 MG PO TB24
25.0000 mg | ORAL_TABLET | Freq: Every day | ORAL | Status: DC
  Administered 2014-04-01: 25 mg via ORAL
  Filled 2014-03-31: qty 1

## 2014-03-31 MED ORDER — BRINZOLAMIDE 1 % OP SUSP
1.0000 [drp] | Freq: Two times a day (BID) | OPHTHALMIC | Status: DC
  Administered 2014-04-01 – 2014-04-02 (×3): 1 [drp] via OPHTHALMIC
  Filled 2014-03-31: qty 10

## 2014-03-31 MED ORDER — POTASSIUM CHLORIDE CRYS ER 20 MEQ PO TBCR
20.0000 meq | EXTENDED_RELEASE_TABLET | Freq: Two times a day (BID) | ORAL | Status: DC
  Administered 2014-04-01 – 2014-04-02 (×3): 20 meq via ORAL
  Filled 2014-03-31 (×4): qty 1

## 2014-03-31 MED ORDER — POLYVINYL ALCOHOL 1.4 % OP SOLN
Freq: Four times a day (QID) | OPHTHALMIC | Status: DC | PRN
  Filled 2014-03-31: qty 15

## 2014-03-31 MED ORDER — SODIUM CHLORIDE (HYPERTONIC) 5 % OP SOLN
1.0000 [drp] | OPHTHALMIC | Status: DC | PRN
  Filled 2014-03-31: qty 15

## 2014-03-31 MED ORDER — ACETAMINOPHEN 325 MG PO TABS: 650.0000 mg | ORAL_TABLET | Freq: Four times a day (QID) | ORAL | Status: DC | PRN

## 2014-03-31 MED ORDER — SODIUM CHLORIDE 0.9 % IJ SOLN
3.0000 mL | Freq: Two times a day (BID) | INTRAMUSCULAR | Status: DC
  Administered 2014-03-31 – 2014-04-02 (×3): 3 mL via INTRAVENOUS

## 2014-03-31 MED ORDER — CITALOPRAM HYDROBROMIDE 20 MG PO TABS
20.0000 mg | ORAL_TABLET | Freq: Every day | ORAL | Status: DC
  Administered 2014-04-01 – 2014-04-02 (×2): 20 mg via ORAL
  Filled 2014-03-31 (×2): qty 1

## 2014-03-31 MED ORDER — ACETAMINOPHEN 650 MG RE SUPP: 650.0000 mg | Freq: Four times a day (QID) | RECTAL | Status: DC | PRN

## 2014-03-31 MED ORDER — ALPRAZOLAM 0.5 MG PO TABS: 0.5000 mg | ORAL_TABLET | Freq: Two times a day (BID) | ORAL | Status: DC | PRN

## 2014-03-31 MED ORDER — LOPERAMIDE HCL 2 MG PO CAPS: 2.0000 mg | ORAL_CAPSULE | ORAL | Status: DC | PRN

## 2014-03-31 MED ORDER — SODIUM CHLORIDE 0.9 % IV SOLN
INTRAVENOUS | Status: AC
  Administered 2014-03-31: via INTRAVENOUS

## 2014-03-31 MED ORDER — NITROGLYCERIN 0.4 MG SL SUBL
0.4000 mg | SUBLINGUAL_TABLET | SUBLINGUAL | Status: DC | PRN
Start: 2014-03-31 — End: 2014-04-02

## 2014-03-31 NOTE — ED Provider Notes (Signed)
CSN: [id number]     Arrival date & time 03/31/14  1743 History   First MD Initiated Contact with Patient 03/31/14 2101     Chief Complaint  Patient presents with  . Rectal Bleeding     (Consider location/radiation/quality/duration/timing/severity/associated sxs/prior Treatment) HPI  [name] is a 81 year old female she is a past medical history of colon cancer not currently being treated for it also history of diverticulosis, who comes in today with 2 episodes of minor right red blood in her stool. One episode was on Thursday where she noticed a small amount of bright red blood on the toilet paper and a slight pink tinge to the toilet bowl.  She's had multiple bowel movements since then without any blood in them. Today she had another bowel movement again noticed the same thing, a little bit of right bright red blood on the toilet paper and a little bit of numbness the bowl.  She also endorses that over the past few days she has had generalized weakness and intermittent lightheadedness.  Past Medical History  Diagnosis Date  . Breast cancer     lumpectomy followed by RT and Tamoxifen x 5 years  . Hypertension   . CAD (coronary artery disease)     a. Mod nonobstructive by cath 2006. b. stress test 2007- no evidence of ischemia; EF 58%  . Dermatitis   . Female stress incontinence   . Seborrheic keratosis, inflamed   . Pulmonary fibrosis   . Hearing loss   . Erosive esophagitis 05/19/10  . Hiatal hernia 05/19/10    5 cm  . Anemia   . Diverticulosis 05/19/10    moderate  . Anxiety   . Colon cancer     a. 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 Tx with Xeloda.  . Stricture esophagus 06/08/10    EGD  . Gastritis   . NSTEMI (non-ST elevated myocardial infarction)     [place]  . Premature atrial contractions   . Pulmonary fibrosis    Past Surgical History  Procedure  Laterality Date  . Breast lumpectomy    . Shoulder arthroscopy w/ acromial repair Bilateral   . Appendectomy    . Colon resection  05/20/2010    [facility]  . Total abdominal hysterectomy w/ bilateral salpingoophorectomy    . Cardiac catheterization  2006    40% ostial LAD, 60% mid LAD followed by 30-40% disease, 50% just after D2, 60-70% D2 lesion followed by 50% disease, normal LCx/RCA; EF 55%  . Tonsillectomy    . Colon surgery    . Total abdominal hysterectomy    . Dilation and curettage of uterus    . Cataract extraction, bilateral Bilateral    Family History  Problem Relation Age of Onset  . Heart disease Mother   . Colon polyps Daughter   . Colon cancer Brother     in 56s  . Breast cancer Daughter   . Kidney failure Father    History  Substance Use Topics  . Smoking status: Former Smoker    Types: Cigarettes    Quit date: 01/04/1983  . Smokeless tobacco: Never Used     Comment: Quit 25-30 years ago  . Alcohol Use: 7.0 oz/week    14 Standard drinks or equivalent per week     Comment: a drink a night, a little scotch helps you   OB History    No data  available     Review of Systems  Constitutional: Negative for fever and chills.  HENT: Negative for nosebleeds.   Eyes: Negative for visual disturbance.  Respiratory: Negative for cough and shortness of breath.   Cardiovascular: Negative for chest pain.  Gastrointestinal: Positive for blood in stool. Negative for nausea, vomiting, abdominal pain, diarrhea and constipation.  Genitourinary: Negative for dysuria.  Skin: Negative for rash.  Neurological: Positive for weakness and light-headedness.  All other systems reviewed and are negative.     Allergies  Indomethacin; Buspar; Codeine; Donepezil hcl; Penicillins; and Sulfonamide derivatives  Home Medications   Prior to Admission medications   Medication Sig Start Date End Date Taking? Authorizing Provider  acetaminophen (TYLENOL) 325 MG tablet Take 650 mg by  mouth every 6 (six) hours as needed for pain.   Yes Historical Provider, MD  ALPRAZolam Duanne Moron) 0.5 MG tablet Take 1 tablet (0.5 mg total) by mouth 2 (two) times daily as needed for anxiety (for panic attack). 02/07/14  Yes Donne Hazel, MD  AZOPT 1 % ophthalmic suspension Place 1 drop into both eyes 2 (two) times daily.  11/15/10  Yes Historical Provider, MD  bimatoprost (LUMIGAN) 0.01 % SOLN Place 1 drop into both eyes at bedtime.   Yes Historical Provider, MD  celecoxib (CELEBREX) 200 MG capsule Take 200 mg by mouth daily.   Yes Historical Provider, MD  citalopram (CELEXA) 20 MG tablet Take 1 tablet (20 mg total) by mouth daily. 03/13/14  Yes Dorena Cookey, MD  clopidogrel (PLAVIX) 75 MG tablet Take 1 tablet (75 mg total) by mouth daily. 02/04/13  Yes Dorena Cookey, MD  fluocinonide cream (LIDEX) 1.96 % Apply 1 application topically as needed.    Yes Historical Provider, MD  furosemide (LASIX) 20 MG tablet Take 1 tablet (20 mg total) by mouth daily. to the 40 mg to make 60 mg 01/30/14  Yes Erlene Quan, PA-C  furosemide (LASIX) 40 MG tablet TAKE 1 TABLET BY MOUTH ONCE DAILY ALONG WITH 20MG  TABLET TO = 60 MG. 03/26/14  Yes Erlene Quan, PA-C  loperamide (IMODIUM) 2 MG capsule Take 2 mg by mouth as needed for diarrhea or loose stools.   Yes Historical Provider, MD  metoprolol succinate (TOPROL-XL) 25 MG 24 hr tablet TAKE 1 TABLET BY MOUTH ONCE A DAY. 02/05/14  Yes Dorena Cookey, MD  Multiple Vitamin (MULTIVITAMIN WITH MINERALS) TABS tablet Take 1 tablet by mouth daily.   Yes Historical Provider, MD  nitroGLYCERIN (NITROSTAT) 0.4 MG SL tablet Place 1 tablet (0.4 mg total) under the tongue every 5 (five) minutes as needed for chest pain (up to 3 doses). 02/04/13  Yes Dorena Cookey, MD  Polyethyl Glycol-Propyl Glycol (SYSTANE ULTRA OP) Apply 1 drop to eye 4 (four) times daily as needed (dry eyes).   Yes Historical Provider, MD  potassium chloride SA (K-DUR,KLOR-CON) 20 MEQ tablet TAKE 2 TABLETS BY MOUTH  ONCE DAILY. Patient taking differently: TAKE1 TABLET  BY MOUTH TWICE DAILY 01/09/14  Yes Dorena Cookey, MD  sodium chloride (MURO 128) 2 % ophthalmic solution Place 1 drop into the left eye.   Yes Historical Provider, MD  busPIRone (BUSPAR) 5 MG tablet TAKE (1) TABLET BY MOUTH TWICE DAILY. Patient not taking: Reported on 03/31/2014 03/10/14   Dorena Cookey, MD  citalopram (CELEXA) 10 MG tablet Take 1 tablet (10 mg total) by mouth daily. 02/04/13   Dorena Cookey, MD  donepezil (ARICEPT) 5 MG tablet Take 1  tablet (5 mg total) by mouth at bedtime. Patient taking differently: Take 5 mg by mouth 3 (three) times a week. Monday, Wednesday, and Friday 02/04/13   Dorena Cookey, MD   BP 151/71 mmHg  Pulse 73  Temp(Src) 97.4 F (36.3 C) (Oral)  Resp 14  Ht 5\' 5"  (1.651 m)  Wt 173 lb (78.472 kg)  BMI 28.79 kg/m2  SpO2 93% Physical Exam  Constitutional: She is oriented to person, place, and time. No distress.  HENT:  Head: Normocephalic and atraumatic.  Eyes: EOM are normal. Pupils are equal, round, and reactive to light.  Neck: Normal range of motion. Neck supple.  Cardiovascular: Normal rate and intact distal pulses.   Pulmonary/Chest: No respiratory distress.  Abdominal: Soft. There is no tenderness.  Genitourinary: Guaiac positive stool (small amount of brown blood on the glove.).  Musculoskeletal: Normal range of motion.  Neurological: She is alert and oriented to person, place, and time. She has normal strength. No cranial nerve deficit or sensory deficit. Coordination normal.  Skin: No rash noted. She is not diaphoretic.  Psychiatric: She has a normal mood and affect.    ED Course  Procedures (including critical care time) Labs Review Labs Reviewed  CBC - Abnormal; Notable for the following:    RBC 3.52 (*)    Hemoglobin 10.8 (*)    HCT 33.5 (*)    RDW 16.4 (*)    All other components within normal limits  COMPREHENSIVE METABOLIC PANEL - Abnormal; Notable for the following:     Sodium 133 (*)    Glucose, Bld 116 (*)    BUN 29 (*)    Albumin 3.3 (*)    GFR calc non Af Amer 46 (*)    GFR calc Af Amer 53 (*)    All other components within normal limits  POC OCCULT BLOOD, ED - Abnormal; Notable for the following:    Fecal Occult Bld POSITIVE (*)    All other components within normal limits  TYPE AND SCREEN  ABO/RH    Imaging Review No results found.   EKG Interpretation None      MDM   Final diagnoses:  None    [name] is a 81 year old female she is a past medical history of colon cancer not currently being treated for it also history of diverticulosis, who comes in today with 2 episodes of minor right red blood in her stool.   Exam as above.  Stool is guaiac + but brown.  VSS.  Cbc w/ hgb around baseline.  Bmp WNL.  Have sent type and screen.  Will admit to hosp medicine service for obs for lower GI bleeding as the patient feels symptomatic with some mild lightheadedness and generalized weakness.  She has no focal deficits on neuro exam, no concern for CVA.         Jarome Matin, MD 04/01/14 6979  Quintella Reichert, MD 04/05/14 1725

## 2014-03-31 NOTE — ED Notes (Signed)
Report attempted 

## 2014-03-31 NOTE — ED Notes (Signed)
Pt reports 2 episodes of bloody stools today. Has hx of colon cancer. Denies pain. Pt is a x 4. In NAD.

## 2014-03-31 NOTE — H&P (Addendum)
[name] is an 81 y.o. female.    Stevie Kern (pcp) Thomas Hoff (GI)  Chief Complaint: rectal bleeding HPI: 81 yo female with CAD, Colon cancer, Diverticulosis, c/o brbpr, small amount of blood on toilet paper starting this past Thursday, and then again today as well as in the toilet bowel.   Pt has had 4 days of diarrhea as well.  Denies fever, chills, n/v, abd pain, black stool.  Pt was brought to ED for evaluation and noted to have mild anemia.  fobc positive.  Pt will be admitted for rectal bleeding.   Past Medical History  Diagnosis Date  . Breast cancer     lumpectomy followed by RT and Tamoxifen x 5 years  . Hypertension   . CAD (coronary artery disease)     a. Mod nonobstructive by cath 2006. b. stress test 2007- no evidence of ischemia; EF 58%  . Dermatitis   . Female stress incontinence   . Seborrheic keratosis, inflamed   . Pulmonary fibrosis   . Hearing loss   . Erosive esophagitis 05/19/10  . Hiatal hernia 05/19/10    5 cm  . Anemia   . Diverticulosis 05/19/10    moderate  . Anxiety   . Colon cancer     a. 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 Tx with Xeloda.  . Stricture esophagus 06/08/10    EGD  . Gastritis   . NSTEMI (non-ST elevated myocardial infarction)     [place]  . Premature atrial contractions   . Pulmonary fibrosis     Past Surgical History  Procedure Laterality Date  . Breast lumpectomy    . Shoulder arthroscopy w/ acromial repair Bilateral   . Appendectomy    . Colon resection  05/20/2010    [facility]  . Total abdominal hysterectomy w/ bilateral salpingoophorectomy    . Cardiac catheterization  2006    40% ostial LAD, 60% mid LAD followed by 30-40% disease, 50% just after D2, 60-70% D2 lesion followed by 50% disease, normal LCx/RCA; EF 55%  . Tonsillectomy    . Colon surgery    . Total abdominal hysterectomy    . Dilation and  curettage of uterus    . Cataract extraction, bilateral Bilateral     Family History  Problem Relation Age of Onset  . Heart disease Mother   . Colon polyps Daughter   . Colon cancer Brother     in 85s  . Breast cancer Daughter   . Kidney failure Father    Social History:  reports that she quit smoking about 31 years ago. Her smoking use included Cigarettes. She has never used smokeless tobacco. She reports that she drinks about 7.0 oz of alcohol per week. She reports that she does not use illicit drugs.  Allergies:  Allergies  Allergen Reactions  . Indomethacin Other (See Comments)    REACTION: TIA type episodes  . Buspar [Buspirone] Other (See Comments)    DIZZINESS AND CONFUSION  . Codeine Nausea And Vomiting and Other (See Comments)    REACTION: Stomach cramps  . Donepezil Hcl Other (See Comments)    DIZZINESS AND CONFUSION  . Penicillins Nausea And Vomiting  . Sulfonamide Derivatives Nausea And Vomiting   Medications reviewed  (Not in a hospital admission)  Results for orders placed or performed during the hospital encounter of 03/31/14 (from the past 48 hour(s))  CBC  Status: Abnormal   Collection Time: 03/31/14  6:14 PM  Result Value Ref Range   WBC 9.4 4.0 - 10.5 K/uL   RBC 3.52 (L) 3.87 - 5.11 MIL/uL   Hemoglobin 10.8 (L) 12.0 - 15.0 g/dL   HCT 33.5 (L) 36.0 - 46.0 %   MCV 95.2 78.0 - 100.0 fL   MCH 30.7 26.0 - 34.0 pg   MCHC 32.2 30.0 - 36.0 g/dL   RDW 16.4 (H) 11.5 - 15.5 %   Platelets 223 150 - 400 K/uL  Comprehensive metabolic panel     Status: Abnormal   Collection Time: 03/31/14  6:14 PM  Result Value Ref Range   Sodium 133 (L) 135 - 145 mmol/L   Potassium 4.4 3.5 - 5.1 mmol/L   Chloride 100 96 - 112 mmol/L   CO2 25 19 - 32 mmol/L   Glucose, Bld 116 (H) 70 - 99 mg/dL   BUN 29 (H) 6 - 23 mg/dL   Creatinine, Ser 1.04 0.50 - 1.10 mg/dL   Calcium 8.6 8.4 - 10.5 mg/dL   Total Protein 6.5 6.0 - 8.3 g/dL   Albumin 3.3 (L) 3.5 - 5.2 g/dL   AST 25 0  - 37 U/L   ALT 12 0 - 35 U/L   Alkaline Phosphatase 63 39 - 117 U/L   Total Bilirubin 0.3 0.3 - 1.2 mg/dL   GFR calc non Af Amer 46 (L) >90 mL/min   GFR calc Af Amer 53 (L) >90 mL/min    Comment: (NOTE) The eGFR has been calculated using the CKD EPI equation. This calculation has not been validated in all clinical situations. eGFR's persistently <90 mL/min signify possible Chronic Kidney Disease.    Anion gap 8 5 - 15  Type and screen     Status: None   Collection Time: 03/31/14  6:14 PM  Result Value Ref Range   ABO/RH(D) O POS    Antibody Screen NEG    Sample Expiration 04/03/2014   ABO/Rh     Status: None   Collection Time: 03/31/14  6:14 PM  Result Value Ref Range   ABO/RH(D) O POS   POC occult blood, ED RN will collect     Status: Abnormal   Collection Time: 03/31/14  8:55 PM  Result Value Ref Range   Fecal Occult Bld POSITIVE (A) NEGATIVE  I-stat troponin, ED     Status: None   Collection Time: 03/31/14 10:24 PM  Result Value Ref Range   Troponin i, poc 0.01 0.00 - 0.08 ng/mL   Comment 3            Comment: Due to the release kinetics of cTnI, a negative result within the first hours of the onset of symptoms does not rule out myocardial infarction with certainty. If myocardial infarction is still suspected, repeat the test at appropriate intervals.    Dg Chest 2 View  03/31/2014   CLINICAL DATA:  81 year old female with rectal bleeding.  EXAM: CHEST  2 VIEW  COMPARISON:  Radiographs and CT 02/06/2014  FINDINGS: The heart remains enlarged. Peripheral fibrosis, unchanged from prior exam. No confluent airspace disease. No pleural effusion or pneumothorax. No acute osseous abnormalities.  IMPRESSION: Cardiomegaly and fibrosis, unchanged from prior exam. No superimposed acute process.   Electronically Signed   By: Jeb Levering M.D.   On: 03/31/2014 22:46    Review of Systems  Constitutional: Negative.   HENT: Negative.   Eyes: Negative.   Respiratory: Negative.  Cardiovascular: Negative.   Gastrointestinal: Positive for diarrhea and blood in stool. Negative for heartburn, nausea, vomiting, abdominal pain, constipation and melena.  Genitourinary: Negative.   Musculoskeletal: Negative.   Skin: Negative.   Neurological: Negative.   Endo/Heme/Allergies: Negative.   Psychiatric/Behavioral: Negative.     Blood pressure 133/67, pulse 71, temperature 97.4 F (36.3 C), temperature source Oral, resp. rate 14, height _0  (1.651 m), weight 78.472 kg (173 lb), SpO2 97 %. Physical Exam  Constitutional: She is oriented to person, place, and time. She appears well-developed and well-nourished.  HENT:  Head: Normocephalic and atraumatic.  Eyes: Conjunctivae and EOM are normal. Pupils are equal, round, and reactive to light. No scleral icterus.  Neck: Normal range of motion. Neck supple. No JVD present. No tracheal deviation present. No thyromegaly present.  Cardiovascular: Normal rate and regular rhythm.  Exam reveals no gallop and no friction rub.   No murmur heard. Occ, premature beat  Respiratory: Effort normal and breath sounds normal. No respiratory distress. She has no wheezes. She has no rales.  GI: Soft. Bowel sounds are normal. She exhibits no distension. There is no tenderness. There is no rebound and no guarding.  Musculoskeletal: Normal range of motion. She exhibits no edema or tenderness.  Lymphadenopathy:    She has no cervical adenopathy.  Neurological: She is alert and oriented to person, place, and time. She has normal reflexes. She displays normal reflexes. No cranial nerve deficit. She exhibits normal muscle tone. Coordination normal.  Skin: Skin is warm and dry. No rash noted. No erythema. No pallor.  Psychiatric: She has a normal mood and affect. Her behavior is normal. Judgment and thought content normal.     Assessment/Plan Rectal bleeding NPO except for medications Stop plavix for now GI consulted by ED, appreciate  input  Anemia Check cbc in am, and cbc 6 hours later  Hyponatremia Hydrate gently with ns iv  Hyperglycemia Consider hga1c  Hypertension Cont current medication  Diarrhea Check stool for fecal leukocytes, culture, c. Diff.   DVT prophylaxis: scd,  No lovenox due to rectal bleeding  Jani Gravel 03/31/2014, 11:00 PM

## 2014-04-01 ENCOUNTER — Encounter (HOSPITAL_COMMUNITY): Payer: Self-pay | Admitting: Internal Medicine

## 2014-04-01 DIAGNOSIS — R739 Hyperglycemia, unspecified: Secondary | ICD-10-CM

## 2014-04-01 DIAGNOSIS — K625 Hemorrhage of anus and rectum: Secondary | ICD-10-CM | POA: Diagnosis not present

## 2014-04-01 DIAGNOSIS — R197 Diarrhea, unspecified: Secondary | ICD-10-CM

## 2014-04-01 DIAGNOSIS — D649 Anemia, unspecified: Secondary | ICD-10-CM | POA: Diagnosis not present

## 2014-04-01 LAB — CBC
HCT: 32.1 % — ABNORMAL LOW (ref 36.0–46.0)
HEMATOCRIT: 32 % — AB (ref 36.0–46.0)
HEMOGLOBIN: 10.3 g/dL — AB (ref 12.0–15.0)
HEMOGLOBIN: 10.3 g/dL — AB (ref 12.0–15.0)
MCH: 30.3 pg (ref 26.0–34.0)
MCH: 30.6 pg (ref 26.0–34.0)
MCHC: 32.1 g/dL (ref 30.0–36.0)
MCHC: 32.2 g/dL (ref 30.0–36.0)
MCV: 94.4 fL (ref 78.0–100.0)
MCV: 95 fL (ref 78.0–100.0)
PLATELETS: 210 10*3/uL (ref 150–400)
Platelets: 188 10*3/uL (ref 150–400)
RBC: 3.4 MIL/uL — ABNORMAL LOW (ref 3.87–5.11)
RBC: 3.37 MIL/uL — ABNORMAL LOW (ref 3.87–5.11)
RDW: 16.3 % — AB (ref 11.5–15.5)
RDW: 16.3 % — AB (ref 11.5–15.5)
WBC: 8.6 10*3/uL (ref 4.0–10.5)
WBC: 6.6 10*3/uL (ref 4.0–10.5)

## 2014-04-01 LAB — COMPREHENSIVE METABOLIC PANEL
ALT: 10 U/L (ref 0–35)
ANION GAP: 7 (ref 5–15)
AST: 23 U/L (ref 0–37)
Albumin: 3 g/dL — ABNORMAL LOW (ref 3.5–5.2)
Alkaline Phosphatase: 53 U/L (ref 39–117)
BUN: 24 mg/dL — ABNORMAL HIGH (ref 6–23)
CHLORIDE: 102 mmol/L (ref 96–112)
CO2: 24 mmol/L (ref 19–32)
Calcium: 8.4 mg/dL (ref 8.4–10.5)
Creatinine, Ser: 0.95 mg/dL (ref 0.50–1.10)
GFR calc Af Amer: 59 mL/min — ABNORMAL LOW (ref >90)
GFR calc non Af Amer: 51 mL/min — ABNORMAL LOW (ref >90)
GLUCOSE: 100 mg/dL — AB (ref 70–99)
Potassium: 3.9 mmol/L (ref 3.5–5.1)
SODIUM: 133 mmol/L — AB (ref 135–145)
Total Bilirubin: 0.7 mg/dL (ref 0.3–1.2)
Total Protein: 5.8 g/dL — ABNORMAL LOW (ref 6.0–8.3)

## 2014-04-01 LAB — ABO/RH: ABO/RH(D): O POS

## 2014-04-01 MED ORDER — FAMOTIDINE 40 MG PO TABS
40.0000 mg | ORAL_TABLET | Freq: Every day | ORAL | Status: DC | PRN
  Filled 2014-04-01: qty 1

## 2014-04-01 MED ORDER — ALPRAZOLAM 0.5 MG PO TABS
0.5000 mg | ORAL_TABLET | Freq: Three times a day (TID) | ORAL | Status: DC | PRN
Start: 2014-04-01 — End: 2014-04-02

## 2014-04-01 MED ORDER — FAMOTIDINE 40 MG PO TABS
40.0000 mg | ORAL_TABLET | Freq: Every day | ORAL | Status: DC
  Filled 2014-04-01 (×3): qty 1

## 2014-04-01 MED ORDER — MORPHINE SULFATE 15 MG PO TABS: 7.5000 mg | ORAL_TABLET | ORAL | Status: DC | PRN

## 2014-04-01 MED ORDER — FERROUS SULFATE 325 (65 FE) MG PO TABS
325.0000 mg | ORAL_TABLET | Freq: Two times a day (BID) | ORAL | Status: DC
  Administered 2014-04-01 – 2014-04-02 (×2): 325 mg via ORAL
  Filled 2014-04-01 (×4): qty 1

## 2014-04-01 MED ORDER — SODIUM CHLORIDE 0.9 % IV SOLN
510.0000 mg | Freq: Once | INTRAVENOUS | Status: AC
  Administered 2014-04-01: 510 mg via INTRAVENOUS
  Filled 2014-04-01: qty 17

## 2014-04-01 NOTE — Progress Notes (Signed)
[facility] PROGRESS NOTE  [name] TMH:[id number] DOB: 1920-01-04 DOA: 03/31/2014 PCP: Joycelyn Man, MD  Brief Summary  [name] is a 81 y.o. 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  She presented with small amounts of blood on the toilet paper starting about 5 days prior to presentation and staining the water in the toilet.  Also 4 days of small volume but frequent diarrhea without fever or abdominal pain.  She had antibiotics a few months ago.  Since admission, no stool sample has been able to be collected because it has been mixed with urine and she continues to have frequent BM.  Bleeding has stopped.  Discussed GOC at length.  Patient had not wanted chemotherapy several years ago and even if she did have cancer, she would not want to have it treated.  She and her daughter would like no colonoscopy and would like to focus more on symptom management.  Her daughter works for hospice.  We reviewed her medication list and have simplified her list to medications primarily for comfort such as lasix, anxiety and pain medication.  Recommended treating her for iron deficiency given ongoing blood loss in order to minimize need for future transfusions.  Vitamin b12 and folate levels were normal just a few months ago.  Her anemia is close to her baseline.  Awaiting stool sample to exclude C. Diff.  Patient looking and feeling well, so anticipate discharge soon.    Assessment/Plan  Rectal bleeding and diarrhea.  ddx includes infectious diarrhea, particularly given antibiotic exposure, hemorrhoid bleeding.  Diverticulosis seems less likely since bleeding not severe.  No abdominal pain or tenderness to suggest ischemia but  still possible.  Cancer also possible, particularly since previous cancer was not treated per standard of care and she has had progressive anorexia and some mild weight loss.   -  c diff and stool culture -  No NSAIDS -  plavix discontinued  Acute blood loss anemia superimposed on anemia of chronic disease -  Iron infusion -  Start oral iron supplementation  CAD,  -  D/c plavix, metoprolol -  Not on statin even prior to admission -  Trying to minimize medications per discussion with family  Chronic diastolic heart failure - has had acute exacerbations requiring hospitalization -  Continue lasix + potassium to prevent SOB  Hyponatremia, stable despite IVF -  Appears euvolemic -  D/c IVF and resume diet  Hyperglycemia, will not treat  Hypertension, medications for comfort only  Depression, uncontrolled.  Continue celexa -  No buspar or abilify  -  Increase xanax to TID prn  Glaucoma, stable, continue eye drops  Diet:  regular Access:  PIV IVF:  off Proph:  SCDs  Code Status: DNR Family Communication: patient and her daughter Disposition Plan: pending c. Diff PCR, likely back to ALF soon   Consultants:  GI, Dr. Carlean Purl  Procedures:  CXR  Antibiotics:  none   HPI/Subjective:  Feels well.  Does not remember having blood in her stools or diarrhea due to memory problems/dementia.  Denies pain.    Objective: Filed Vitals:   04/01/14 0510 04/01/14 0726 04/01/14 1107 04/01/14 1500  BP: 123/80  144/71 142/65  Pulse: 80 70 75 57  Temp: 98.3 F (36.8 C)  TempSrc: Oral     Resp: 18   18  Height:      Weight:      SpO2: 99%   93%    Intake/Output Summary (Last 24 hours) at 04/01/14 1719 Last data filed at 04/01/14 1530  Gross per 24 hour  Intake    240 ml  Output   1075 ml  Net   -835 ml   Filed Weights   03/31/14 1754 03/31/14 2325  Weight: 78.472 kg (173 lb) 80.831 kg (178 lb 3.2 oz)    Exam:   General:  Well-appearing pleasant female, No  acute distress  HEENT:  NCAT, MMM  Cardiovascular:  RRR, nl S1, S2 no mrg, 2+ pulses, warm extremities  Respiratory:  CTAB, no increased WOB  Abdomen:   NABS, soft, NT/ND  MSK:   Normal tone and bulk, trace bilateral pitting LEE  Neuro:  Grossly intact  Data Reviewed: Basic Metabolic Panel:  Recent Labs Lab 03/31/14 1814 04/01/14 0515  NA 133* 133*  K 4.4 3.9  CL 100 102  CO2 25 24  GLUCOSE 116* 100*  BUN 29* 24*  CREATININE 1.04 0.95  CALCIUM 8.6 8.4   Liver Function Tests:  Recent Labs Lab 03/31/14 1814 04/01/14 0515  AST 25 23  ALT 12 10  ALKPHOS 63 53  BILITOT 0.3 0.7  PROT 6.5 5.8*  ALBUMIN 3.3* 3.0*   No results for input(s): LIPASE, AMYLASE in the last 168 hours. No results for input(s): AMMONIA in the last 168 hours. CBC:  Recent Labs Lab 03/31/14 1814 04/01/14 0355 04/01/14 1040  WBC 9.4 8.6 6.6  HGB 10.8* 10.3* 10.3*  HCT 33.5* 32.1* 32.0*  MCV 95.2 94.4 95.0  PLT 223 210 188   Cardiac Enzymes: No results for input(s): CKTOTAL, CKMB, CKMBINDEX, TROPONINI in the last 168 hours. BNP (last 3 results)  Recent Labs  02/06/14 1340  BNP 613.8*    ProBNP (last 3 results)  Recent Labs  12/09/13 1224 01/20/14 1559 01/30/14 1414  PROBNP 4150.0* 869.0* 2363.00*    CBG: No results for input(s): GLUCAP in the last 168 hours.  No results found for this or any previous visit (from the past 240 hour(s)).   Studies: Dg Chest 2 View  03/31/2014   CLINICAL DATA:  81 year old female with rectal bleeding.  EXAM: CHEST  2 VIEW  COMPARISON:  Radiographs and CT 02/06/2014  FINDINGS: The heart remains enlarged. Peripheral fibrosis, unchanged from prior exam. No confluent airspace disease. No pleural effusion or pneumothorax. No acute osseous abnormalities.  IMPRESSION: Cardiomegaly and fibrosis, unchanged from prior exam. No superimposed acute process.   Electronically Signed   By: Jeb Levering M.D.   On: 03/31/2014 22:46    Scheduled  Meds: . brinzolamide  1 drop Both Eyes BID  . citalopram  20 mg Oral Daily  . ferrous sulfate  325 mg Oral BID WC  . furosemide  60 mg Oral Daily  . latanoprost  1 drop Both Eyes QHS  . potassium chloride SA  20 mEq Oral BID  . sodium chloride  3 mL Intravenous Q12H   Continuous Infusions:   Active Problems:   Normocytic anemia   Rectal bleeding   Hyperglycemia    Time spent: 30 min    Herberto Ledwell, [facility] Pager [phone]. If 7PM-7AM, please contact night-coverage at [email or web address], password [facility] 04/01/2014, 5:19 PM

## 2014-04-01 NOTE — Progress Notes (Signed)
UR completed 

## 2014-04-01 NOTE — Consult Note (Signed)
[facility] Consult: 10:19 AM 04/01/2014     Referring Provider: Dr Sheran Fava  Primary Care Physician:  Joycelyn Man, MD Primary Gastroenterologist:  Dr. Olevia Perches    Reason for Consultation:  Blood per rectum   HPI: [name] is a 81 y.o. female.  Hx CAD, pulmonary fibrosis.  Hx breast cancer, s/p lumpectomy, radiation, tamoxifen Rx. Hx colon cancer, diverticulosis.  S/p 06/2010 partial colectomy, tx with Xeloda. Chronic Plavix for ? What diagnoses. HOH.   For at least 2 weeks, having loose stools about 3 x per day.  Baseline is formed stool every 1 to 2 days.  On 3/24 she saw some blood in the stool, next time she saw blood was yesterday, MD advised her to go to ED and she was admitted.  Hgb in ED 10.8, 10.3 today, historically runs ~ 11.2.  BUN 29.  Coags not assayed.   Takes Celebrex daily.  Took abx 2 to 3 months ago for LE cellulitis.  No nausea but + decline of appetite in last several weeks.  Weight down from 181 to 178 in last few weeks. No dysphagia, no abd pain.  No chest pain.  + positional dizziness but this is not new, she is essentially sedentary in last 2 months.  Dtr relays memory deficits but no outright confusion or severe disorientation.     ENDOSCOPIC STUDIES: Barium esophagram 11/2011 Tiny hiatal hernia a small Schatzki ring. However, a barium-coated tablet readily passed through the esophagus into the stomach. Mild mucosal nodularity may reflect sequelae of reflux esophagitis. No reflux elicited during the examination. Occasional tertiary contractions  06/2010 EGD Distal esophageal stricture was Savaray dilated, 3 cm HH, mild gastritis.  Path: BENIGN GASTRIC MUCOSA WITH MINIMAL CHRONIC INFLAMMATION. NO HELICOBACTER PYLORI, DYSPLASIA, OR MALIGNANCY.  05/2010 EGD Erosive  esophagitis, GE Jx stricture, not dilated, 5 cm HH.   05/2010 Colonoscopy Malignant appearing mass transverse colon.  Sigmoid and descending tics. Path: invasive adenoca.   2004 Colonoscopy Hyperplastic and adenomatous polyps.       Past Medical History  Diagnosis Date  . Breast cancer     lumpectomy followed by RT and Tamoxifen x 5 years  . Hypertension   . CAD (coronary artery disease)     a. Mod nonobstructive by cath 2006. b. stress test 2007- no evidence of ischemia; EF 58%  . Dermatitis   . Female stress incontinence   . Seborrheic keratosis, inflamed   . Pulmonary fibrosis   . Hearing loss   . Erosive esophagitis 05/19/10  . Hiatal hernia 05/19/10    5 cm  . Anemia   . Diverticulosis 05/19/10    moderate  . Anxiety   . Colon cancer     a. 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 Tx with Xeloda.  . Stricture esophagus 06/08/10    EGD  . Gastritis   . NSTEMI (non-ST elevated myocardial infarction)     [place]  . Premature atrial contractions   .  Pulmonary fibrosis     Past Surgical History  Procedure Laterality Date  . Breast lumpectomy    . Shoulder arthroscopy w/ acromial repair Bilateral   . Appendectomy    . Colon resection  05/20/2010    [facility]  . Total abdominal hysterectomy w/ bilateral salpingoophorectomy    . Cardiac catheterization  2006    40% ostial LAD, 60% mid LAD followed by 30-40% disease, 50% just after D2, 60-70% D2 lesion followed by 50% disease, normal LCx/RCA; EF 55%  . Tonsillectomy    . Colon surgery    . Total abdominal hysterectomy    . Dilation and curettage of uterus    . Cataract extraction, bilateral Bilateral     Prior to Admission medications   Medication Sig Start Date End Date Taking? Authorizing Provider  acetaminophen (TYLENOL) 325 MG tablet Take 650 mg by mouth every 6 (six) hours as needed for pain.   Yes Historical  Provider, MD  ALPRAZolam Duanne Moron) 0.5 MG tablet Take 1 tablet (0.5 mg total) by mouth 2 (two) times daily as needed for anxiety (for panic attack). 02/07/14  Yes Donne Hazel, MD  AZOPT 1 % ophthalmic suspension Place 1 drop into both eyes 2 (two) times daily.  11/15/10  Yes Historical Provider, MD  bimatoprost (LUMIGAN) 0.01 % SOLN Place 1 drop into both eyes at bedtime.   Yes Historical Provider, MD  celecoxib (CELEBREX) 200 MG capsule Take 200 mg by mouth daily.   Yes Historical Provider, MD  citalopram (CELEXA) 20 MG tablet Take 1 tablet (20 mg total) by mouth daily. 03/13/14  Yes Dorena Cookey, MD  clopidogrel (PLAVIX) 75 MG tablet Take 1 tablet (75 mg total) by mouth daily. 02/04/13  Yes Dorena Cookey, MD  fluocinonide cream (LIDEX) 8.67 % Apply 1 application topically as needed.    Yes Historical Provider, MD  furosemide (LASIX) 20 MG tablet Take 1 tablet (20 mg total) by mouth daily. to the 40 mg to make 60 mg 01/30/14  Yes Erlene Quan, PA-C  furosemide (LASIX) 40 MG tablet TAKE 1 TABLET BY MOUTH ONCE DAILY ALONG WITH 20MG  TABLET TO = 60 MG. 03/26/14  Yes Doreene Burke Kilroy, PA-C  loperamide (IMODIUM) 2 MG capsule Take 2 mg by mouth as needed for diarrhea or loose stools.   Yes Historical Provider, MD  metoprolol succinate (TOPROL-XL) 25 MG 24 hr tablet TAKE 1 TABLET BY MOUTH ONCE A DAY. 02/05/14  Yes Dorena Cookey, MD  Multiple Vitamin (MULTIVITAMIN WITH MINERALS) TABS tablet Take 1 tablet by mouth daily.   Yes Historical Provider, MD  nitroGLYCERIN (NITROSTAT) 0.4 MG SL tablet Place 1 tablet (0.4 mg total) under the tongue every 5 (five) minutes as needed for chest pain (up to 3 doses). 02/04/13  Yes Dorena Cookey, MD  Polyethyl Glycol-Propyl Glycol (SYSTANE ULTRA OP) Apply 1 drop to eye 4 (four) times daily as needed (dry eyes).   Yes Historical Provider, MD  potassium chloride SA (K-DUR,KLOR-CON) 20 MEQ tablet TAKE 2 TABLETS BY MOUTH ONCE DAILY. Patient taking differently: TAKE1 TABLET  BY MOUTH  TWICE DAILY 01/09/14  Yes Dorena Cookey, MD  sodium chloride (MURO 128) 2 % ophthalmic solution Place 1 drop into the left eye.   Yes Historical Provider, MD  busPIRone (BUSPAR) 5 MG tablet TAKE (1) TABLET BY MOUTH TWICE DAILY. Patient not taking: Reported on 03/31/2014 03/10/14   Dorena Cookey, MD  citalopram (CELEXA) 10 MG tablet Take 1 tablet (10  mg total) by mouth daily. 02/04/13   Dorena Cookey, MD  donepezil (ARICEPT) 5 MG tablet Take 1 tablet (5 mg total) by mouth at bedtime. Patient taking differently: Take 5 mg by mouth 3 (three) times a week. Monday, Wednesday, and Friday 02/04/13   Dorena Cookey, MD    Scheduled Meds: . brinzolamide  1 drop Both Eyes BID  . citalopram  20 mg Oral Daily  . furosemide  60 mg Oral Daily  . latanoprost  1 drop Both Eyes QHS  . metoprolol succinate  25 mg Oral Daily  . potassium chloride SA  20 mEq Oral BID  . sodium chloride  3 mL Intravenous Q12H   Infusions: . sodium chloride 75 mL/hr at 03/31/14 2347   PRN Meds: acetaminophen **OR** acetaminophen, ALPRAZolam, loperamide, nitroGLYCERIN, polyvinyl alcohol, sodium chloride   Allergies as of 03/31/2014 - Review Complete 03/31/2014  Allergen Reaction Noted  . Indomethacin Other (See Comments)   . Buspar [buspirone] Other (See Comments) 03/31/2014  . Codeine Nausea And Vomiting and Other (See Comments)   . Donepezil hcl Other (See Comments) 03/31/2014  . Penicillins Nausea And Vomiting   . Sulfonamide derivatives Nausea And Vomiting     Family History  Problem Relation Age of Onset  . Heart disease Mother   . Colon polyps Daughter   . Colon cancer Brother     in 55s  . Breast cancer Daughter   . Kidney failure Father     History   Social History  . Marital Status: Divorced    Spouse Name: N/A  . Number of Children: N/A  . Years of Education: N/A   Occupational History  . retired    Social History Main Topics  . Smoking status: Former Smoker    Types: Cigarettes    Quit date:  01/04/1983  . Smokeless tobacco: Never Used     Comment: Quit 25-30 years ago  . Alcohol Use: 7.0 oz/week    14 Standard drinks or equivalent per week     Comment: a drink a night, a little scotch helps you  . Drug Use: No  . Sexual Activity: No   Other Topics Concern  . Not on file   Social History Narrative    REVIEW OF SYSTEMS: Constitutional:  Per HPI ENT:  No nose bleeds Pulm:  + DOE, no cough CV:  No palpitations, no LE edema. No chest pain.  GU:  No hematuria, no frequency GI:  Per HPI Heme:  No gross bleeding except the recent BPR   Transfusions:  none Neuro:  No headaches, no peripheral tingling or numbness.  Memory deficits more pronounced in last several months.  Derm:  No itching, no rash or sores.  Endocrine:  No sweats or chills.  No polyuria or dysuria Immunization:  Reviewed.  Travel:  None beyond local counties in last few months.    PHYSICAL EXAM: Vital signs in last 24 hours: Filed Vitals:   04/01/14 0510  BP: 123/80  Pulse: 80  Temp: 98.3 F (36.8 C)  Resp: 18   Wt Readings from Last 3 Encounters:  03/31/14 178 lb 3.2 oz (80.831 kg)  02/28/14 182 lb 8 oz (82.781 kg)  02/07/14 177 lb 3.2 oz (80.377 kg)    General: pleasant, looks well. comfortable Head:  No swelling or asymmetry.  Eyes:  No icterus or conj pallor Ears:  + HOH.   Nose:  No congestion or discharge Mouth:  Clear, moist oral MM.  Neck:  No JVD, no TMG, no mass Lungs:  Crackles in bases, >> on right.  No dyspnea or cough. Heart: RRR.  No MRG.  S1/S2 audible. Abdomen:  Soft, NT, ND, active BS.  No mass, no HSM, no bruits, no hernias. .   Rectal: black stool is 3+FOBT +.  No mass, no palpable or visible hemorrhoids.    Musc/Skeltl: some arthritic changes in hands/fingers and knees. Extremities:  Dusky skin coloration of LE c/w venous insufficiency, no pitting on minor edema  Neurologic:  Oriented x 3, appropriate, no tremor.  Skin:  No rash or sores.   Tattoos:  none Nodes:   No cervical adenopathy    Psych:  Pleasant, cooperative.      LAB RESULTS:  Recent Labs  03/31/14 1814 04/01/14 0355  WBC 9.4 8.6  HGB 10.8* 10.3*  HCT 33.5* 32.1*  PLT 223 210   BMET Lab Results  Component Value Date   NA 133* 04/01/2014   NA 133* 03/31/2014   NA 138 02/07/2014   K 3.9 04/01/2014   K 4.4 03/31/2014   K 4.2 02/07/2014   CL 102 04/01/2014   CL 100 03/31/2014   CL 100 02/07/2014   CO2 24 04/01/2014   CO2 25 03/31/2014   CO2 30 02/07/2014   GLUCOSE 100* 04/01/2014   GLUCOSE 116* 03/31/2014   GLUCOSE 113* 02/07/2014   BUN 24* 04/01/2014   BUN 29* 03/31/2014   BUN 19 02/07/2014   CREATININE 0.95 04/01/2014   CREATININE 1.04 03/31/2014   CREATININE 1.03 02/07/2014   CALCIUM 8.4 04/01/2014   CALCIUM 8.6 03/31/2014   CALCIUM 8.5 02/07/2014   LFT  Recent Labs  03/31/14 1814 04/01/14 0515  PROT 6.5 5.8*  ALBUMIN 3.3* 3.0*  AST 25 23  ALT 12 10  ALKPHOS 63 53  BILITOT 0.3 0.7      RADIOLOGY STUDIES: Dg Chest 2 View  03/31/2014   CLINICAL DATA:  81 year old female with rectal bleeding.  EXAM: CHEST  2 VIEW  COMPARISON:  Radiographs and CT 02/06/2014  FINDINGS: The heart remains enlarged. Peripheral fibrosis, unchanged from prior exam. No confluent airspace disease. No pleural effusion or pneumothorax. No acute osseous abnormalities.  IMPRESSION: Cardiomegaly and fibrosis, unchanged from prior exam. No superimposed acute process.   Electronically Signed   By: Jeb Levering M.D.   On: 03/31/2014 22:46     IMPRESSION:   *  Small volume hematochezia in setting of ~ 2 weeks diarrhea in aged pt s/p partial colectomy, Xeloda for colon cancer 06/2010.  Established dx diveticulosis per previous colonoscopy.  Hgb dropped, but not a significant amount.  Rule out infectious diarrhea with secondary bleeding from colonic irritation.  Given Abx in recent months, C diff is in DDx.   Less likely diverticular bleed given the low volume of blood seen.   *   Hx gastritis, HH, esoph stricture.  Daily Celexa.  No PPI or H2 blocker in use.   *  Chronic Plavix for unclear reason.  No sign CAD on previous studies, no a fib, no hx CVA.     PLAN:     *  Regular diet.   *  Hold imodium until she is able to submit a BM specimen.  *  Add Famotidine.    *  Reconsider indication/need for Plavix.    Azucena Freed  04/01/2014, 10:19 AM Pager: [phone]     [facility] Attending  I have also seen and assessed the patient and agree with  the advanced practitioner's assessment and plan.  ? Infectious C diff diarrhea and some low grade bleeding Will reassess tomorrow. No need for endoscopic evaluation at this time.   Gatha Mayer, MD, [facility] [phone] (pager) 04/01/2014 1:50 PM

## 2014-04-01 NOTE — Care Management Note (Unsigned)
    Page 1 of 1   04/01/2014     4:11:00 PM CARE MANAGEMENT NOTE 04/01/2014  Patient:  [name], [name]   Account Number:  [id number]  Date Initiated:  04/01/2014  Documentation initiated by:  Hansford Hirt  Subjective/Objective Assessment:   Pt adm on 03/31/14 with rectal bleed.  PTA, resides at home.     Action/Plan:   Will follow for dc needs as pt progresses.   Anticipated DC Date:  04/02/2014   Anticipated DC Plan:  [place]  CM consult      Choice offered to / List presented to:             Status of service:  In process, will continue to follow Medicare Important Message given?   (If response is "NO", the following Medicare IM given date fields will be blank) Date Medicare IM given:   Medicare IM given by:   Date Additional Medicare IM given:   Additional Medicare IM given by:    Discharge Disposition:    Per UR Regulation:  Reviewed for med. necessity/level of care/duration of stay  If discussed at [place] of Stay Meetings, dates discussed:    Comments:

## 2014-04-02 ENCOUNTER — Encounter (HOSPITAL_COMMUNITY): Payer: Self-pay | Admitting: General Practice

## 2014-04-02 DIAGNOSIS — K625 Hemorrhage of anus and rectum: Secondary | ICD-10-CM | POA: Diagnosis not present

## 2014-04-02 DIAGNOSIS — D649 Anemia, unspecified: Secondary | ICD-10-CM | POA: Diagnosis not present

## 2014-04-02 DIAGNOSIS — R739 Hyperglycemia, unspecified: Secondary | ICD-10-CM | POA: Diagnosis not present

## 2014-04-02 LAB — BASIC METABOLIC PANEL
ANION GAP: 5 (ref 5–15)
BUN: 18 mg/dL (ref 6–23)
CALCIUM: 8.5 mg/dL (ref 8.4–10.5)
CHLORIDE: 103 mmol/L (ref 96–112)
CO2: 29 mmol/L (ref 19–32)
Creatinine, Ser: 1.03 mg/dL (ref 0.50–1.10)
GFR, EST AFRICAN AMERICAN: 53 mL/min — AB (ref >90)
GFR, EST NON AFRICAN AMERICAN: 46 mL/min — AB (ref >90)
GLUCOSE: 88 mg/dL (ref 70–99)
POTASSIUM: 4.2 mmol/L (ref 3.5–5.1)
Sodium: 137 mmol/L (ref 135–145)

## 2014-04-02 LAB — CBC
HCT: 32.9 % — ABNORMAL LOW (ref 36.0–46.0)
HEMOGLOBIN: 10.7 g/dL — AB (ref 12.0–15.0)
MCH: 30.9 pg (ref 26.0–34.0)
MCHC: 32.5 g/dL (ref 30.0–36.0)
MCV: 95.1 fL (ref 78.0–100.0)
PLATELETS: 192 10*3/uL (ref 150–400)
RBC: 3.46 MIL/uL — AB (ref 3.87–5.11)
RDW: 16.3 % — ABNORMAL HIGH (ref 11.5–15.5)
WBC: 8.6 10*3/uL (ref 4.0–10.5)

## 2014-04-02 LAB — GLUCOSE, CAPILLARY: Glucose-Capillary: 101 mg/dL — ABNORMAL HIGH (ref 70–99)

## 2014-04-02 NOTE — Progress Notes (Signed)
Daily Rounding Note  04/02/2014, 8:24 AM    SUBJECTIVE:       No C diff yet collected. One formed, brown and non-blood containing stool yesterday.  Feels well, eating well, no complaints.   OBJECTIVE:         Vital signs in last 24 hours:    Temp:  [98.2 F (36.8 C)-98.4 F (36.9 C)] 98.4 F (36.9 C) (03/30 0435) Pulse Rate:  [57-77] 77 (03/30 0435) Resp:  [17-18] 18 (03/30 0435) BP: (119-144)/(55-71) 119/55 mmHg (03/30 0435) SpO2:  [93 %-95 %] 95 % (03/30 0435) Weight:  [177 lb 4 oz (80.4 kg)] 177 lb 4 oz (80.4 kg) (03/30 0435) Last BM Date: 03/31/14 Filed Weights   03/31/14 1754 03/31/14 2325 04/02/14 0435  Weight: 173 lb (78.472 kg) 178 lb 3.2 oz (80.831 kg) 177 lb 4 oz (80.4 kg)   General: pleasant, NAD.  Looks well.    Heart: RRR.  No MRG Chest: no dyspnea, no cough.  A few rales in the left base.  Abdomen: soft, NT, ND.  No HSM or mass  Extremities: no CCE Neuro/Psych:  Pleasant, not confused.  Relaxed.   Intake/Output from previous day: 03/29 0701 - 03/30 0700 In: 504 [P.O.:504] Out: 1375 [Urine:1375]  Intake/Output this shift:    Lab Results:  Recent Labs  04/01/14 0355 04/01/14 1040 04/02/14 0320  WBC 8.6 6.6 8.6  HGB 10.3* 10.3* 10.7*  HCT 32.1* 32.0* 32.9*  PLT 210 188 192   BMET  Recent Labs  03/31/14 1814 04/01/14 0515 04/02/14 0320  NA 133* 133* 137  K 4.4 3.9 4.2  CL 100 102 103  CO2 25 24 29   GLUCOSE 116* 100* 88  BUN 29* 24* 18  CREATININE 1.04 0.95 1.03  CALCIUM 8.6 8.4 8.5   LFT  Recent Labs  03/31/14 1814 04/01/14 0515  PROT 6.5 5.8*  ALBUMIN 3.3* 3.0*  AST 25 23  ALT 12 10  ALKPHOS 63 53  BILITOT 0.3 0.7   PT/INR No results for input(s): LABPROT, INR in the last 72 hours.  Studies/Results: Dg Chest 2 View  03/31/2014   CLINICAL DATA:  81 year old female with rectal bleeding.  EXAM: CHEST  2 VIEW  COMPARISON:  Radiographs and CT 02/06/2014  FINDINGS:  The heart remains enlarged. Peripheral fibrosis, unchanged from prior exam. No confluent airspace disease. No pleural effusion or pneumothorax. No acute osseous abnormalities.  IMPRESSION: Cardiomegaly and fibrosis, unchanged from prior exam. No superimposed acute process.   Electronically Signed   By: Jeb Levering M.D.   On: 03/31/2014 22:46    ASSESMENT:   * Small volume hematochezia in setting of ~ 2 weeks diarrhea in aged pt s/p partial colectomy, Xeloda for colon cancer 06/2010. Established dx diveticulosis per previous colonoscopy. abx exposure within last 3 months.  Mild decline of Hgb but overall stable, no indications of ABL anemia.  S/p Feraheme.  Rule out infectious diarrhea with secondary bleeding from colonic irritation. Given Abx in recent months, C diff is in DDx.  Less likely diverticular bleed given the low volume of blood seen.   * Hx gastritis, HH, esoph stricture. Daily Celebrex. No PPI or H2 blocker in use PTA, Famotidine added. .   * Chronic Plavix for unclear reason. No sign CAD on previous studies, no a fib, no hx CVA.     PLAN   *  Stop contact precautions and discontinue ordered stool studies.  Ok to  send back to [place] residence.  If she has recurrent diarrhea, should be checked for C diff as she has risk factors for this.  * given hx gastritis and esophageal stricture/dilation, daily would probably keep her on an H2 blocker.   *  The Plavix continuation/discontinuation needs to be sorted out.      Azucena Freed  04/02/2014, 8:24 AM Pager: [phone]  [facility] Attending  I have also seen and assessed the patient and agree with the advanced practitioner's assessment and plan. Symptoms have resolved. Patient and daughter pleased about this and with conservative approach.  Gatha Mayer, MD, [facility] [phone] (pager) 04/02/2014 5:37 PM

## 2014-04-02 NOTE — Progress Notes (Signed)
CSW (Clinical Education officer, museum) confirmed with facility that pt can dc back. Pt daughter at bedside and confirmed she will transport pt back to facility.  [place], [place]

## 2014-04-02 NOTE — Progress Notes (Addendum)
[name] to be D/C'd Home to [facility] per MD order.  Discussed with the patient and all questions fully answered.  VSS, Skin clean, dry and intact without evidence of skin break down, no evidence of skin tears noted. IV catheter discontinued intact. Site without signs and symptoms of complications. Dressing and pressure applied.  An After Visit Summary was printed and given to the patient. Packet prepared by Education officer, museum and sent with patient to facility.  D/c education completed with patient/family including follow up instructions, medication list, d/c activities limitations if indicated, with other d/c instructions as indicated by MD - patient able to verbalize understanding, all questions fully answered.   Patient instructed to return to ED, call 911, or call MD for any changes in condition.   Patient escorted via [facility], and D/C to [facility] via private auto. Attempted to call report to [facility]. Left message and awaiting call back.  Micki Riley 04/02/2014 2:31 PM

## 2014-04-02 NOTE — Progress Notes (Signed)
CSW (Clinical Education officer, museum) left voicemail for facility. Awaiting return phone call. Full CSW assessment to follow.   [place], [place]

## 2014-04-02 NOTE — Discharge Summary (Signed)
Physician Discharge Summary  [name] ERD:[id number] DOB: 1920/02/15 DOA: 03/31/2014  PCP: Joycelyn Man, MD  Admit date: 03/31/2014 Discharge date: 04/02/2014  Time spent: 30 minutes  Recommendations for Outpatient Follow-up:  1. Follow-up with palliative care facility.  Discharge Diagnoses:  Active Problems:   Normocytic anemia   Rectal bleeding   Hyperglycemia   Discharge Condition: Guarded  Diet recommendation: regular  Filed Weights   03/31/14 1754 03/31/14 2325 04/02/14 0435  Weight: 78.472 kg (173 lb) 80.831 kg (178 lb 3.2 oz) 80.4 kg (177 lb 4 oz)    History of present illness:  81 yo female with CAD, Colon cancer, Diverticulosis, c/o brbpr, small amount of blood on toilet paper starting this past Thursday, and then again today as well as in the toilet bowel. Pt has had 4 days of diarrhea as well. Denies fever, chills, n/v, abd pain, black stool. Pt was brought to ED for evaluation and noted to have mild anemia. fobc positive. Pt will be admitted for rectal bleeding.   Hospital Course:  Rectal bleeding and diarrhea: - No further bleeding or diarrhea  has remained afebrile leukocytosis afebrile. Her diarrhea stopped spontaneously. - C. difficile PCR was not obtained, had a long discussion with the family about Plavix and further procedures. They are pretty convinced it would not like a colonoscopy as we are not going to treat her with chemotherapy. - They will also like to start stopping some of her medications for prophylaxis, Including Plavix. - She was put on Plavix since 2006. I will discussion with the family about the risk and benefits and they would like to DC the Plavix.  Acute blood loss anemia superimposed on anemia of chronic disease - Iron infusion - Start oral iron supplementation  CAD,  - D/c plavix, cont metoprolol - I had a long discussion with the family they would like to minimize medications.  Chronic diastolic heart failure -  has had acute exacerbations requiring hospitalization - Continue lasix + potassium to prevent SOB. - For comfort management.  Hyponatremia, stable despite IVF - Appears euvolemic - D/c IVF and resume diet  Hypertension, medications for comfort only  Depression, uncontrolled. Continue celexa - No buspar or abilify  - Increase xanax to TID prn  Procedures:  None  Consultations:  GI  Discharge Exam: Filed Vitals:   04/02/14 1147  BP: 148/73  Pulse: 77  Temp: 97.7 F (36.5 C)  Resp:     General: A&O x3 Cardiovascular: RRR Respiratory: good air movement CTA B/L  Discharge Instructions   Discharge Instructions    Diet - low sodium heart healthy    Complete by:  As directed      Increase activity slowly    Complete by:  As directed           Current Discharge Medication List    CONTINUE these medications which have NOT CHANGED   Details  acetaminophen (TYLENOL) 325 MG tablet Take 650 mg by mouth every 6 (six) hours as needed for pain.    ALPRAZolam (XANAX) 0.5 MG tablet Take 1 tablet (0.5 mg total) by mouth 2 (two) times daily as needed for anxiety (for panic attack). Qty: 30 tablet, Refills: 0    AZOPT 1 % ophthalmic suspension Place 1 drop into both eyes 2 (two) times daily.    Associated Diagnoses: Malignant neoplasm of colon, unspecified site; Colon cancer    bimatoprost (LUMIGAN) 0.01 % SOLN Place 1 drop into both eyes at bedtime.  celecoxib (CELEBREX) 200 MG capsule Take 200 mg by mouth daily.    citalopram (CELEXA) 20 MG tablet Take 1 tablet (20 mg total) by mouth daily. Qty: 100 tablet, Refills: 3    fluocinonide cream (LIDEX) 9.37 % Apply 1 application topically as needed.     furosemide (LASIX) 40 MG tablet TAKE 1 TABLET BY MOUTH ONCE DAILY ALONG WITH 20MG  TABLET TO = 60 MG. Qty: 30 tablet, Refills: 6    metoprolol succinate (TOPROL-XL) 25 MG 24 hr tablet TAKE 1 TABLET BY MOUTH ONCE A DAY. Qty: 30 tablet, Refills: 11     nitroGLYCERIN (NITROSTAT) 0.4 MG SL tablet Place 1 tablet (0.4 mg total) under the tongue every 5 (five) minutes as needed for chest pain (up to 3 doses). Qty: 25 tablet, Refills: 3    Polyethyl Glycol-Propyl Glycol (SYSTANE ULTRA OP) Apply 1 drop to eye 4 (four) times daily as needed (dry eyes).    potassium chloride SA (K-DUR,KLOR-CON) 20 MEQ tablet TAKE 2 TABLETS BY MOUTH ONCE DAILY. Qty: 30 tablet, Refills: 5    sodium chloride (MURO 128) 2 % ophthalmic solution Place 1 drop into the left eye.      STOP taking these medications     clopidogrel (PLAVIX) 75 MG tablet      loperamide (IMODIUM) 2 MG capsule      Multiple Vitamin (MULTIVITAMIN WITH MINERALS) TABS tablet      busPIRone (BUSPAR) 5 MG tablet      donepezil (ARICEPT) 5 MG tablet        Allergies  Allergen Reactions  . Indomethacin Other (See Comments)    REACTION: TIA type episodes  . Buspar [Buspirone] Other (See Comments)    DIZZINESS AND CONFUSION  . Codeine Nausea And Vomiting and Other (See Comments)    REACTION: Stomach cramps  . Donepezil Hcl Other (See Comments)    DIZZINESS AND CONFUSION  . Penicillins Nausea And Vomiting  . Sulfonamide Derivatives Nausea And Vomiting      The results of significant diagnostics from this hospitalization (including imaging, microbiology, ancillary and laboratory) are listed below for reference.    Significant Diagnostic Studies: Dg Chest 2 View  03/31/2014   CLINICAL DATA:  81 year old female with rectal bleeding.  EXAM: CHEST  2 VIEW  COMPARISON:  Radiographs and CT 02/06/2014  FINDINGS: The heart remains enlarged. Peripheral fibrosis, unchanged from prior exam. No confluent airspace disease. No pleural effusion or pneumothorax. No acute osseous abnormalities.  IMPRESSION: Cardiomegaly and fibrosis, unchanged from prior exam. No superimposed acute process.   Electronically Signed   By: Jeb Levering M.D.   On: 03/31/2014 22:46    Microbiology: No results  found for this or any previous visit (from the past 240 hour(s)).   Labs: Basic Metabolic Panel:  Recent Labs Lab 03/31/14 1814 04/01/14 0515 04/02/14 0320  NA 133* 133* 137  K 4.4 3.9 4.2  CL 100 102 103  CO2 25 24 29   GLUCOSE 116* 100* 88  BUN 29* 24* 18  CREATININE 1.04 0.95 1.03  CALCIUM 8.6 8.4 8.5   Liver Function Tests:  Recent Labs Lab 03/31/14 1814 04/01/14 0515  AST 25 23  ALT 12 10  ALKPHOS 63 53  BILITOT 0.3 0.7  PROT 6.5 5.8*  ALBUMIN 3.3* 3.0*   No results for input(s): LIPASE, AMYLASE in the last 168 hours. No results for input(s): AMMONIA in the last 168 hours. CBC:  Recent Labs Lab 03/31/14 1814 04/01/14 0355 04/01/14 1040 04/02/14 0320  WBC 9.4 8.6 6.6 8.6  HGB 10.8* 10.3* 10.3* 10.7*  HCT 33.5* 32.1* 32.0* 32.9*  MCV 95.2 94.4 95.0 95.1  PLT 223 210 188 192   Cardiac Enzymes: No results for input(s): CKTOTAL, CKMB, CKMBINDEX, TROPONINI in the last 168 hours. BNP: BNP (last 3 results)  Recent Labs  02/06/14 1340  BNP 613.8*    ProBNP (last 3 results)  Recent Labs  12/09/13 1224 01/20/14 1559 01/30/14 1414  PROBNP 4150.0* 869.0* 2363.00*    CBG:  Recent Labs Lab 04/02/14 1148  GLUCAP 101*       Signed:  FELIZ ORTIZ, ABRAHAM  [facility] 04/02/2014, 11:54 AM

## 2014-04-07 ENCOUNTER — Telehealth: Payer: Self-pay | Admitting: Cardiology

## 2014-04-07 NOTE — Telephone Encounter (Signed)
Spoke with Mickel Baas to give confirmation for patient okay to start PT per Dr.Hocrein

## 2014-04-07 NOTE — Telephone Encounter (Signed)
Outgoing call to Mickel Baas, PT w/ [facility].  She eval'd this pt post-hospitalization.  Seeking OK for 2 weeks Phys Therapy for pt. Will route to Dr. Percival Spanish.

## 2014-04-07 NOTE — Telephone Encounter (Signed)
OK for PT

## 2014-04-07 NOTE — Telephone Encounter (Signed)
[name] is calling to request orders for Physical Therapy twice a week for 2 wks. Please call    Thanks

## 2014-05-09 ENCOUNTER — Telehealth: Payer: Self-pay | Admitting: *Deleted

## 2014-05-09 NOTE — Telephone Encounter (Signed)
PT  Progress reviewed, signed, and return faxed

## 2014-05-16 ENCOUNTER — Telehealth: Payer: Self-pay | Admitting: Family Medicine

## 2014-05-16 NOTE — Telephone Encounter (Signed)
Pt was in the hospital. She has gone to [facility]. Pt has decided to go w/ palliative care per [facility] recommendation.  They have cut back on her meds. They increased Celexa and rx for morphine if she needs.(has no used yet. [facility] states this recommendation must come from dr todd and not hospitals. daughter would like your input.  Will wait to hear from you.

## 2014-05-19 NOTE — Telephone Encounter (Signed)
Dr Sherren Mocha agrees with palliative care and daughter is aware.

## 2014-10-27 DIAGNOSIS — Z7689 Persons encountering health services in other specified circumstances: Secondary | ICD-10-CM

## 2014-10-28 ENCOUNTER — Ambulatory Visit (INDEPENDENT_AMBULATORY_CARE_PROVIDER_SITE_OTHER): Payer: Medicare Other | Admitting: Cardiology

## 2014-10-28 ENCOUNTER — Encounter: Payer: Self-pay | Admitting: Cardiology

## 2014-10-28 VITALS — BP 128/78 | HR 71 | Ht 66.0 in | Wt 176.9 lb

## 2014-10-28 DIAGNOSIS — I10 Essential (primary) hypertension: Secondary | ICD-10-CM

## 2014-10-28 NOTE — Patient Instructions (Signed)
Your physician recommends that you schedule a follow-up appointment in: As Needed    

## 2014-10-28 NOTE — Progress Notes (Signed)
HPI  The patient presents for follow-up of CAD.  Since I last saw her she was hospitalized once with some bleeding from her rectum. She was taken off of Plavix.  It was decided to minimize her therapies is now being seen by palliative care.  She has done well. She's had none of the panic for acute attacks that she was having. She's not having any new GI bleeding. She's had no chest pressure, neck or arm discomfort. She's had no palpitations, presyncope or syncope. She's living in a nursing home and ambulates once daily to the dining hall.  Allergies  Allergen Reactions  . Indomethacin Other (See Comments)    REACTION: TIA type episodes  . Buspar [Buspirone] Other (See Comments)    DIZZINESS AND CONFUSION  . Codeine Nausea And Vomiting and Other (See Comments)    REACTION: Stomach cramps  . Donepezil Hcl Other (See Comments)    DIZZINESS AND CONFUSION  . Penicillins Nausea And Vomiting  . Sulfonamide Derivatives Nausea And Vomiting    Current Outpatient Prescriptions  Medication Sig Dispense Refill  . acetaminophen (TYLENOL) 325 MG tablet Take 650 mg by mouth every 6 (six) hours as needed for pain.    Marland Kitchen ALPRAZolam (XANAX) 0.5 MG tablet Take 1 tablet (0.5 mg total) by mouth 2 (two) times daily as needed for anxiety (for panic attack). 30 tablet 0  . AZOPT 1 % ophthalmic suspension Place 1 drop into both eyes 2 (two) times daily.     . bimatoprost (LUMIGAN) 0.01 % SOLN Place 1 drop into both eyes at bedtime.    . citalopram (CELEXA) 20 MG tablet Take 1 tablet (20 mg total) by mouth daily. 100 tablet 3  . furosemide (LASIX) 40 MG tablet TAKE 1 TABLET BY MOUTH ONCE DAILY ALONG WITH 20MG  TABLET TO = 60 MG. 30 tablet 6  . metoprolol succinate (TOPROL-XL) 25 MG 24 hr tablet TAKE 1 TABLET BY MOUTH ONCE A DAY. 30 tablet 11  . nitroGLYCERIN (NITROSTAT) 0.4 MG SL tablet Place 1 tablet (0.4 mg total) under the tongue every 5 (five) minutes as needed for chest pain (up to 3 doses). 25 tablet 3  .  ondansetron (ZOFRAN) 4 MG tablet as needed.    Vladimir Faster Glycol-Propyl Glycol (SYSTANE ULTRA OP) Apply 1 drop to eye 4 (four) times daily as needed (dry eyes).    . potassium chloride SA (K-DUR,KLOR-CON) 20 MEQ tablet TAKE 2 TABLETS BY MOUTH ONCE DAILY. (Patient taking differently: TAKE1 TABLET  BY MOUTH TWICE DAILY) 30 tablet 5  . sodium chloride (MURO 128) 2 % ophthalmic solution Place 1 drop into the left eye.     No current facility-administered medications for this visit.    Past Medical History  Diagnosis Date  . Breast cancer ([place])     lumpectomy followed by RT and Tamoxifen x 5 years  . Hypertension   . CAD (coronary artery disease)     a. Mod nonobstructive by cath 2006. b. stress test 2007- no evidence of ischemia; EF 58%  . Dermatitis   . Female stress incontinence   . Seborrheic keratosis, inflamed   . Pulmonary fibrosis ([place])   . Hearing loss   . Erosive esophagitis 05/19/10  . Hiatal hernia 05/19/10    5 cm  . Anemia   . Diverticulosis 05/19/10    moderate  . Anxiety   . Colon cancer ([place])     a. Stage III (pT4 pN1) moderately differentiated carcinoma of the transverse  colon, status post a partial colectomy 05/20/2010. Tx with Xeloda.  . Stricture esophagus 06/08/10    EGD  . Gastritis   . NSTEMI (non-ST elevated myocardial infarction) [place]     [place]  . Premature atrial contractions   . Pulmonary fibrosis [facility])     Past Surgical History  Procedure Laterality Date  . Breast lumpectomy    . Shoulder arthroscopy w/ acromial repair Bilateral   . Appendectomy    . Colon resection  05/20/2010    [facility]  . Total abdominal hysterectomy w/ bilateral salpingoophorectomy    . Cardiac catheterization  2006    40% ostial LAD, 60% mid LAD followed by 30-40% disease, 50% just after D2, 60-70% D2 lesion followed by 50% disease, normal LCx/RCA; EF 55%  . Tonsillectomy    . Colon surgery    . Total abdominal hysterectomy    . Dilation and curettage  of uterus    . Cataract extraction, bilateral Bilateral     ROS:  As stated in the HPI and negative for all other systems.  PHYSICAL EXAM BP 128/78 mmHg  Pulse 71  Ht 5\' 6"  (1.676 m)  Wt 176 lb 14.4 oz (80.241 kg)  BMI 28.57 kg/m2 GENERAL:  Frail but in no distress NECK:  No palpitations, waveform within normal limits, carotid upstroke brisk and symmetric, no bruits, no thyromegaly LUNGS:  Clear to auscultation bilaterally BACK:  No CVA tenderness, lordosis HEART:  PMI not displaced or sustained,S1 and S2 within normal limits, no S3, no S4, no clicks, no rubs, no murmurs ABD:  Flat, positive bowel sounds normal in frequency in pitch, no bruits, no rebound, no guarding, no midline pulsatile mass, no hepatomegaly, no splenomegaly EXT:  2 plus pulses throughout, no edema, no cyanosis no clubbing  EKG:  Sinus rhythm, rate 71, axis within normal limits, right bundle branch block, premature ventricular contraction, no acute ST-T wave changes. 10/28/2014  ASSESSMENT AND PLAN  ACUTE ON CHRONIC DIASTOLIC HF:  She seems to be euvolemic.  No change in therapy is indicated.   NSTEMI/SOB:  Patient's had no acute anginal symptoms.  She can see me as needed.  HTN:  Her BP is well controlled.  She will continue the meds as listed.  CAD:  This has been mild as and we are managing this conservatively as above.

## 2015-06-30 ENCOUNTER — Encounter (HOSPITAL_COMMUNITY): Payer: Self-pay | Admitting: Nurse Practitioner

## 2015-06-30 ENCOUNTER — Observation Stay (HOSPITAL_COMMUNITY)
Admission: EM | Admit: 2015-06-30 | Discharge: 2015-07-01 | Disposition: A | Discharge status: Nursing Home (Includes ICF) | Payer: Medicare Other | Attending: Internal Medicine | Admitting: Internal Medicine

## 2015-06-30 ENCOUNTER — Emergency Department (HOSPITAL_COMMUNITY): Payer: Medicare Other

## 2015-06-30 DIAGNOSIS — F329 Major depressive disorder, single episode, unspecified: Secondary | ICD-10-CM | POA: Insufficient documentation

## 2015-06-30 DIAGNOSIS — Z9049 Acquired absence of other specified parts of digestive tract: Secondary | ICD-10-CM | POA: Insufficient documentation

## 2015-06-30 DIAGNOSIS — Z66 Do not resuscitate: Secondary | ICD-10-CM | POA: Diagnosis not present

## 2015-06-30 DIAGNOSIS — I2510 Atherosclerotic heart disease of native coronary artery without angina pectoris: Secondary | ICD-10-CM | POA: Diagnosis not present

## 2015-06-30 DIAGNOSIS — Z853 Personal history of malignant neoplasm of breast: Secondary | ICD-10-CM | POA: Insufficient documentation

## 2015-06-30 DIAGNOSIS — R5383 Other fatigue: Secondary | ICD-10-CM

## 2015-06-30 DIAGNOSIS — Z85038 Personal history of other malignant neoplasm of large intestine: Secondary | ICD-10-CM | POA: Diagnosis not present

## 2015-06-30 DIAGNOSIS — R7989 Other specified abnormal findings of blood chemistry: Secondary | ICD-10-CM | POA: Diagnosis not present

## 2015-06-30 DIAGNOSIS — I509 Heart failure, unspecified: Secondary | ICD-10-CM

## 2015-06-30 DIAGNOSIS — I252 Old myocardial infarction: Secondary | ICD-10-CM | POA: Diagnosis not present

## 2015-06-30 DIAGNOSIS — I4581 Long QT syndrome: Secondary | ICD-10-CM | POA: Diagnosis not present

## 2015-06-30 DIAGNOSIS — R778 Other specified abnormalities of plasma proteins: Secondary | ICD-10-CM

## 2015-06-30 DIAGNOSIS — R9431 Abnormal electrocardiogram [ECG] [EKG]: Secondary | ICD-10-CM

## 2015-06-30 DIAGNOSIS — I10 Essential (primary) hypertension: Secondary | ICD-10-CM

## 2015-06-30 DIAGNOSIS — F419 Anxiety disorder, unspecified: Secondary | ICD-10-CM | POA: Insufficient documentation

## 2015-06-30 DIAGNOSIS — Z7189 Other specified counseling: Secondary | ICD-10-CM | POA: Insufficient documentation

## 2015-06-30 DIAGNOSIS — I110 Hypertensive heart disease with heart failure: Principal | ICD-10-CM | POA: Insufficient documentation

## 2015-06-30 DIAGNOSIS — Z87891 Personal history of nicotine dependence: Secondary | ICD-10-CM | POA: Diagnosis not present

## 2015-06-30 DIAGNOSIS — I5043 Acute on chronic combined systolic (congestive) and diastolic (congestive) heart failure: Secondary | ICD-10-CM | POA: Diagnosis not present

## 2015-06-30 DIAGNOSIS — I5040 Unspecified combined systolic (congestive) and diastolic (congestive) heart failure: Secondary | ICD-10-CM | POA: Diagnosis present

## 2015-06-30 DIAGNOSIS — J8410 Pulmonary fibrosis, unspecified: Secondary | ICD-10-CM | POA: Insufficient documentation

## 2015-06-30 DIAGNOSIS — Z515 Encounter for palliative care: Secondary | ICD-10-CM | POA: Insufficient documentation

## 2015-06-30 DIAGNOSIS — F32A Depression, unspecified: Secondary | ICD-10-CM

## 2015-06-30 LAB — BASIC METABOLIC PANEL
Anion gap: 5 (ref 5–15)
BUN: 11 mg/dL (ref 6–20)
CALCIUM: 8.4 mg/dL — AB (ref 8.9–10.3)
CO2: 26 mmol/L (ref 22–32)
CREATININE: 0.81 mg/dL (ref 0.44–1.00)
Chloride: 105 mmol/L (ref 101–111)
GLUCOSE: 96 mg/dL (ref 65–99)
Potassium: 5 mmol/L (ref 3.5–5.1)
Sodium: 136 mmol/L (ref 135–145)

## 2015-06-30 LAB — URINALYSIS, ROUTINE W REFLEX MICROSCOPIC
BILIRUBIN URINE: NEGATIVE
Glucose, UA: NEGATIVE mg/dL
Hgb urine dipstick: NEGATIVE
Ketones, ur: NEGATIVE mg/dL
NITRITE: NEGATIVE
PH: 7 (ref 5.0–8.0)
Protein, ur: NEGATIVE mg/dL
SPECIFIC GRAVITY, URINE: 1.008 (ref 1.005–1.030)

## 2015-06-30 LAB — URINE MICROSCOPIC-ADD ON

## 2015-06-30 LAB — CBC WITH DIFFERENTIAL/PLATELET
BASOS ABS: 0 10*3/uL (ref 0.0–0.1)
BASOS PCT: 0 %
EOS ABS: 0.6 10*3/uL (ref 0.0–0.7)
Eosinophils Relative: 6 %
HCT: 34.8 % — ABNORMAL LOW (ref 36.0–46.0)
Hemoglobin: 10.7 g/dL — ABNORMAL LOW (ref 12.0–15.0)
Lymphocytes Relative: 15 %
Lymphs Abs: 1.4 10*3/uL (ref 0.7–4.0)
MCH: 30.1 pg (ref 26.0–34.0)
MCHC: 30.7 g/dL (ref 30.0–36.0)
MCV: 98 fL (ref 78.0–100.0)
MONO ABS: 0.9 10*3/uL (ref 0.1–1.0)
MONOS PCT: 10 %
NEUTROS PCT: 69 %
Neutro Abs: 6.1 10*3/uL (ref 1.7–7.7)
Platelets: 170 10*3/uL (ref 150–400)
RBC: 3.55 MIL/uL — ABNORMAL LOW (ref 3.87–5.11)
RDW: 15.8 % — AB (ref 11.5–15.5)
WBC: 9 10*3/uL (ref 4.0–10.5)

## 2015-06-30 LAB — BRAIN NATRIURETIC PEPTIDE: B NATRIURETIC PEPTIDE 5: 807.2 pg/mL — AB (ref 0.0–100.0)

## 2015-06-30 LAB — TROPONIN I
TROPONIN I: 0.05 ng/mL — AB (ref <0.03)
TROPONIN I: 0.05 ng/mL — AB (ref <0.03)

## 2015-06-30 MED ORDER — ACETAMINOPHEN 325 MG PO TABS: 650.0000 mg | ORAL_TABLET | Freq: Four times a day (QID) | ORAL | Status: DC | PRN

## 2015-06-30 MED ORDER — METOPROLOL TARTRATE 25 MG PO TABS: 25.0000 mg | ORAL_TABLET | Freq: Every day | ORAL | Status: DC

## 2015-06-30 MED ORDER — ENOXAPARIN SODIUM 40 MG/0.4ML ~~LOC~~ SOLN
40.0000 mg | SUBCUTANEOUS | Status: DC
  Administered 2015-06-30: 40 mg via SUBCUTANEOUS
  Filled 2015-06-30: qty 0.4

## 2015-06-30 MED ORDER — BISACODYL 5 MG PO TBEC: 5.0000 mg | DELAYED_RELEASE_TABLET | Freq: Every day | ORAL | Status: DC | PRN

## 2015-06-30 MED ORDER — FUROSEMIDE 10 MG/ML IJ SOLN
40.0000 mg | Freq: Once | INTRAMUSCULAR | Status: AC
  Administered 2015-06-30: 40 mg via INTRAVENOUS
  Filled 2015-06-30: qty 4

## 2015-06-30 MED ORDER — SODIUM CHLORIDE (HYPERTONIC) 2 % OP SOLN
1.0000 [drp] | OPHTHALMIC | Status: DC | PRN
  Filled 2015-06-30: qty 15

## 2015-06-30 MED ORDER — DOCUSATE SODIUM 100 MG PO CAPS
100.0000 mg | ORAL_CAPSULE | Freq: Every day | ORAL | Status: DC
  Administered 2015-07-01: 100 mg via ORAL
  Filled 2015-06-30: qty 1

## 2015-06-30 MED ORDER — NITROGLYCERIN 0.4 MG SL SUBL: 0.4000 mg | SUBLINGUAL_TABLET | SUBLINGUAL | Status: DC | PRN

## 2015-06-30 MED ORDER — CITALOPRAM HYDROBROMIDE 10 MG PO TABS
10.0000 mg | ORAL_TABLET | Freq: Every day | ORAL | Status: DC
  Administered 2015-06-30 – 2015-07-01 (×2): 10 mg via ORAL
  Filled 2015-06-30 (×2): qty 1

## 2015-06-30 MED ORDER — METOPROLOL TARTRATE 25 MG PO TABS
25.0000 mg | ORAL_TABLET | Freq: Every day | ORAL | Status: DC
  Administered 2015-06-30 – 2015-07-01 (×2): 25 mg via ORAL
  Filled 2015-06-30 (×2): qty 1

## 2015-06-30 MED ORDER — ALPRAZOLAM 0.5 MG PO TABS: 0.5000 mg | ORAL_TABLET | Freq: Two times a day (BID) | ORAL | Status: DC | PRN

## 2015-06-30 MED ORDER — ACETAMINOPHEN 650 MG RE SUPP: 650.0000 mg | Freq: Four times a day (QID) | RECTAL | Status: DC | PRN

## 2015-06-30 MED ORDER — BRINZOLAMIDE 1 % OP SUSP
1.0000 [drp] | Freq: Two times a day (BID) | OPHTHALMIC | Status: DC
  Administered 2015-06-30 – 2015-07-01 (×2): 1 [drp] via OPHTHALMIC
  Filled 2015-06-30: qty 10

## 2015-06-30 MED ORDER — HYDRALAZINE HCL 20 MG/ML IJ SOLN: 10.0000 mg | Freq: Three times a day (TID) | INTRAMUSCULAR | Status: DC | PRN

## 2015-06-30 NOTE — ED Notes (Signed)
Report attempted, RN to call back. 

## 2015-06-30 NOTE — H&P (Signed)
History and Physical    [name] [id number] DOB: 12-18-1920 DOA: 06/30/2015  PCP: No primary care provider on file.  Patient coming from:  Nursing  home    Chief Complaint: Fatigue  HPI: [name] is a 82 y.o. female with medical history significant for but not limited to coronary artery disease, pulmonary fibrosis, stage III colon cancer 2012 status post colon resection. Patient transported from nursing home this morning after staff and rounding physician found her to be lethargic sleeping at dinner table. BP noted at NH to be elevated. . Daughter is at bedside, helps with history. Patient has no complaints. She admits to being chronically short of breath with exertion. No cough. No urinary symptoms. Bowel movements are at baseline. Per daughter, patient has not recently started any new medications which would be contributing to lethargy.  ED Course:  BNP 807, troponin 0.05, Mean normal CBC normal. Hgb at baseline, 10.7  EKG Sinus rhythm Multiple ventricular premature complexes Probable left atrial enlargement Right bundle branch block Baseline wander in lead(s) II III aVF No significant change since last tracing QTc 504, 491 in October  Review of Systems: As per HPI, otherwise 10 point review of systems negative.   Past Medical History  Diagnosis Date  . Breast cancer ([place])     lumpectomy followed by RT and Tamoxifen x 5 years  . Hypertension   . CAD (coronary artery disease)     a. Mod nonobstructive by cath 2006. b. stress test 2007- no evidence of ischemia; EF 58%  . Dermatitis   . Female stress incontinence   . Seborrheic keratosis, inflamed   . Pulmonary fibrosis ([place])   . Hearing loss   . Erosive esophagitis 05/19/10  . Hiatal hernia 05/19/10    5 cm  . Anemia   . Diverticulosis 05/19/10    moderate  . Anxiety   . Colon cancer ([place])     a. 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 Tx with Xeloda.  . Stricture esophagus 06/08/10    EGD  . Gastritis   . NSTEMI (non-ST elevated myocardial infarction) [place]     [place]  . Premature atrial contractions   . Pulmonary fibrosis [facility])     Past Surgical History  Procedure Laterality Date  . Breast lumpectomy    . Shoulder arthroscopy w/ acromial repair Bilateral   . Appendectomy    . Colon resection  05/20/2010    [facility]  . Total abdominal hysterectomy w/ bilateral salpingoophorectomy    . Cardiac catheterization  2006    40% ostial LAD, 60% mid LAD followed by 30-40% disease, 50% just after D2, 60-70% D2 lesion followed by 50% disease, normal LCx/RCA; EF 55%  . Tonsillectomy    . Colon surgery    . Total abdominal hysterectomy    . Dilation and curettage of uterus    . Cataract extraction, bilateral Bilateral     Social History   Social History  . Marital Status: Divorced    Spouse Name: N/A  . Number of Children: N/A  . Years of Education: N/A   Occupational History  . retired    Social History Main Topics  . Smoking status: Former Smoker    Types: Cigarettes    Quit date: 01/04/1983  . Smokeless tobacco: Never Used     Comment: Quit 25-30 years ago  . Alcohol Use: 7.0 oz/week  14 Standard drinks or equivalent per week     Comment: a drink a night, a little scotch helps you  . Drug Use: No  . Sexual Activity: No   Other Topics Concern  . Not on file   Social History Narrative   Resides in a nursing home. Uses both a cane and walker for ambulation Allergies  Allergen Reactions  . Indomethacin Other (See Comments)    REACTION: TIA type episodes  . Buspar [Buspirone] Other (See Comments)    DIZZINESS AND CONFUSION  . Codeine Nausea And Vomiting and Other (See Comments)    REACTION: Stomach cramps  . Donepezil Hcl Other (See Comments)    DIZZINESS AND CONFUSION  . Penicillins Nausea And Vomiting  . Sulfonamide Derivatives Nausea And Vomiting     Family History  Problem Relation Age of Onset  . Heart disease Mother   . Colon polyps Daughter   . Colon cancer Brother     in 58s  . Breast cancer Daughter   . Kidney failure Father      Prior to Admission medications   Medication Sig Start Date End Date Taking? Authorizing Provider  acetaminophen (TYLENOL) 325 MG tablet Take 650 mg by mouth every 6 (six) hours as needed for pain.   Yes Historical Provider, MD  AZOPT 1 % ophthalmic suspension Place 1 drop into both eyes 2 (two) times daily.  11/15/10  Yes Historical Provider, MD  bimatoprost (LUMIGAN) 0.01 % SOLN Place 1 drop into both eyes at bedtime.   Yes Historical Provider, MD  bimatoprost (LUMIGAN) 0.03 % ophthalmic solution Place 1 drop into both eyes at bedtime.   Yes Historical Provider, MD  citalopram (CELEXA) 10 MG tablet Take 10 mg by mouth daily.   Yes Historical Provider, MD  docusate sodium (COLACE) 100 MG capsule Take 100 mg by mouth daily.   Yes Historical Provider, MD  ferrous sulfate 325 (65 FE) MG tablet Take 325 mg by mouth daily with breakfast.   Yes Historical Provider, MD  furosemide (LASIX) 40 MG tablet TAKE 1 TABLET BY MOUTH ONCE DAILY ALONG WITH 20MG  TABLET TO = 60 MG. 03/26/14  Yes Luke K Kilroy, PA-C  METOPROLOL TARTRATE PO Take 25 mg by mouth daily.   Yes Historical Provider, MD  Multiple Vitamins-Minerals (ICAPS AREDS 2) CAPS Take 1 tablet by mouth 2 (two) times daily.   Yes Historical Provider, MD  sodium chloride (MURO 128) 2 % ophthalmic solution Place 1 drop into the left eye.   Yes Historical Provider, MD  ALPRAZolam Duanne Moron) 0.5 MG tablet Take 1 tablet (0.5 mg total) by mouth 2 (two) times daily as needed for anxiety (for panic attack). 02/07/14   Donne Hazel, MD  citalopram (CELEXA) 20 MG tablet Take 1 tablet (20 mg total) by mouth daily. 03/13/14   Dorena Cookey, MD  metoprolol succinate (TOPROL-XL) 25 MG 24 hr tablet TAKE 1 TABLET BY MOUTH ONCE A DAY. Patient not taking: Reported on 06/30/2015  02/05/14   Dorena Cookey, MD  nitroGLYCERIN (NITROSTAT) 0.4 MG SL tablet Place 1 tablet (0.4 mg total) under the tongue every 5 (five) minutes as needed for chest pain (up to 3 doses). 02/04/13   Dorena Cookey, MD  ondansetron (ZOFRAN) 4 MG tablet as needed. 08/19/14   Historical Provider, MD  Polyethyl Glycol-Propyl Glycol (SYSTANE ULTRA OP) Apply 1 drop to eye 4 (four) times daily as needed (dry eyes).    Historical Provider, MD  potassium chloride SA (  K-DUR,KLOR-CON) 20 MEQ tablet TAKE 2 TABLETS BY MOUTH ONCE DAILY. Patient taking differently: TAKE1 TABLET  BY MOUTH TWICE DAILY 01/09/14   Dorena Cookey, MD    Physical Exam: Filed Vitals:   06/30/15 1432 06/30/15 1513 06/30/15 1530 06/30/15 1600  BP: 180/97 170/95 168/90 150/91  Pulse: 86  89 86  Temp:      TempSrc:      Resp: 17  22 33  Height:      Weight:      SpO2: 93% 95% 97% 94%    Constitutional:  Pleasant, well-developed white female in NAD, calm, comfortable Filed Vitals:   06/30/15 1432 06/30/15 1513 06/30/15 1530 06/30/15 1600  BP: 180/97 170/95 168/90 150/91  Pulse: 86  89 86  Temp:      TempSrc:      Resp: 17  22 33  Height:      Weight:      SpO2: 93% 95% 97% 94%   Eyes: PER, lids and conjunctivae normal ENMT: Mucous membranes are moist. Posterior pharynx clear of any exudate or lesions.Normal dentition.  Neck: normal, supple, no masses Respiratory: clear to auscultation bilaterally, no wheezing, no crackles. Normal respiratory effort. No accessory muscle use.  Cardiovascular: Regular rate and rhythm, + murmur. No / gallops. 2-3+ BLE edema, 2+ pedal pulses.  Abdomen: no tenderness, no masses palpated. No hepatomegaly. Bowel sounds positive.  Musculoskeletal: no clubbing / cyanosis. No joint deformity upper and lower extremities. Good ROM, no contractures. Normal muscle tone.  Skin: no rashes, lesions, ulcers.  Neurologic: CN 2-12 grossly intact. Sensation intact, Strength 5/5 in all 4.  Psychiatric: Normal  judgment and insight. Alert and oriented x 3. Normal mood.   Labs on Admission: I have personally reviewed following labs and imaging studies  Radiological Exams on Admission: Dg Chest 2 View  06/30/2015  CLINICAL DATA:  Shortness of breath for 2 days.  Weakness. EXAM: CHEST  2 VIEW COMPARISON:  03/31/2014 FINDINGS: Cardiomegaly. Changes of pulmonary fibrosis in the lungs. More prominent opacities noted in the lungs bilaterally could reflect progressive fibrosis/chronic changes or superimposed acute edema. No acute bony abnormality. IMPRESSION: Cardiomegaly. Underlying chronic lung disease/ fibrosis. More pronounced interstitial and airspace opacities in the lower lung zones could reflect progressive fibrosis or superimposed acute pulmonary edema. Electronically Signed   By: Rolm Baptise M.D.   On: 06/30/2015 14:31    EKG: Independently reviewed.   EKG Interpretation  Date/Time:  Tuesday June 30 2015 13:12:39 EDT Ventricular Rate:  91 PR Interval:    QRS Duration: 142 QT Interval:  409 QTC Calculation: 504 R Axis:   7 Text Interpretation:  Sinus rhythm Multiple ventricular premature complexes Probable left atrial enlargement Right bundle branch block Baseline wander in lead(s) II III aVF No significant change since last tracing Confirmed by KNAPP  MD-J, JON [id number]) on 06/30/2015 1:17:09 PM       Assessment/Plan   Active Problems:   Combined congestive systolic and diastolic heart failure (HCC)   CHF (congestive heart failure) (HCC)   Depression   Lethargy   Elevated troponin   Prolonged Q-T interval on ECG   HTN (hypertension)      Lethargy, acute. Not lethargic on my exam now. May be from decompensated heart failure. While in room I noticed urine was malodorous, rule out UTI. Patient 82 years old, has colon cancer. Family doesn't want aggressive measures. -Place in observation-telemetry -We will cycle troponins but again family does not want any aggressive measures -Urinalysis  Elevated trop - 0.05. No chest pain. EKG unchanged from previous.  -on telemetry -cycle trop.  -No aggressive measures per daughter          Acute on chronic heart failure. Patient has history of combined congestive systolic and diastolic heart failure and chronic dyspnea. Echo Dec 2015 with EF of 45-50%. Severe LVH pattern. Patient nor daughter feel shortness of breath any worse than usual but there is bilateral peripheral edema, elevated BNP and suggestion of pulmonary edema on cxr. -Diuresis. Takes 60 of Lasix daily -Daily weights, monitor I and 0 -prn 02  Hypertension. BP elevated in ED.  -continue home metoprolol.  -add prn Hydralazine   History of stage III colon cancer. Status post left hemicolectomy with primary anastomosis May 2012. Patient had limited chemo then opted out of treatment. Followed outpatient by Palliative  prolonged QTc interval on EKG -Avoid QT prolonging medications when possible though on Celexa which will be continued for now.   Depression, stable.  . -Continue home citalopram  Anxiety -Continue when necessary xanax  DVT prophylaxis:   Lovenox Code Status:     DNR Family Communication:    Discussed plan of care with daughter who is in the room, she understands and agrees with the plan of treatment  Disposition Plan:  Discharge back to Nursing home in 24-48 hours              Consults called:  none Admission status:  Observation - Medical bed / telemetry  Admission -  Medical bed  Telemetry    Tye Savoy NP [facility] Pager [phone]  If 7PM-7AM, please contact night-coverage [email or web address] Password [facility]  06/30/2015, 4:42 PM

## 2015-06-30 NOTE — ED Provider Notes (Signed)
CSN: [id number]     Arrival date & time 06/30/15  1308 History  By signing my name below, I, [name], attest that this documentation has been prepared under the direction and in the presence of Dorie Rank, MD . Electronically Signed: Rowan Mills, Scribe. 06/30/2015. 1:50 PM.   Chief Complaint  Patient presents with  . Weakness    The history is provided by the patient. No language interpreter was used.   HPI Comments:  [name] is a 82 y.o. female with PMHx of HTN, CAD, diverticulosis, colon cancer, breast cancer, NSTEMI and pulmonary fibrosis who presents to the Emergency Department via EMS from [facility] complaining of weakness and fatigue today. Pt states she had high blood pressure and mild shortness of breath this morning. Daughter reports pt was hypertensive and was "mildly unresponsive" on morning rounds today. She notes pt has c/o shortness of breath previously, usually better with conscious deep breathing. Daughter states pt was normal yesterday. Pt has stage 3 colon cancer and has chosen not to receive treatment. Denies any pain currently, abdominal pain, or chest pain.  Past Medical History  Diagnosis Date  . Breast cancer ([place])     lumpectomy followed by RT and Tamoxifen x 5 years  . Hypertension   . CAD (coronary artery disease)     a. Mod nonobstructive by cath 2006. b. stress test 2007- no evidence of ischemia; EF 58%  . Dermatitis   . Female stress incontinence   . Seborrheic keratosis, inflamed   . Pulmonary fibrosis ([place])   . Hearing loss   . Erosive esophagitis 05/19/10  . Hiatal hernia 05/19/10    5 cm  . Anemia   . Diverticulosis 05/19/10    moderate  . Anxiety   . Colon cancer ([place])     a. 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 Tx with Xeloda.  . Stricture esophagus 06/08/10    EGD  . Gastritis   . NSTEMI (non-ST elevated myocardial infarction) ([place]    [place]  . Premature atrial contractions   . Pulmonary fibrosis [facility])    Past Surgical History  Procedure Laterality Date  . Breast lumpectomy    . Shoulder arthroscopy w/ acromial repair Bilateral   . Appendectomy    . Colon resection  05/20/2010    [facility]  . Total abdominal hysterectomy w/ bilateral salpingoophorectomy    . Cardiac catheterization  2006    40% ostial LAD, 60% mid LAD followed by 30-40% disease, 50% just after D2, 60-70% D2 lesion followed by 50% disease, normal LCx/RCA; EF 55%  . Tonsillectomy    . Colon surgery    . Total abdominal hysterectomy    . Dilation and curettage of uterus    . Cataract extraction, bilateral Bilateral    Family History  Problem Relation Age of Onset  . Heart disease Mother   . Colon polyps Daughter   . Colon cancer Brother     in 62s  . Breast cancer Daughter   . Kidney failure Father    Social History  Substance Use Topics  . Smoking status: Former Smoker    Types: Cigarettes    Quit date: 01/04/1983  . Smokeless tobacco: Never Used     Comment: Quit 25-30 years ago  . Alcohol Use: 7.0 oz/week    14 Standard drinks or equivalent per week     Comment: a drink  a night, a little scotch helps you   OB History    No data available     Review of Systems  10 systems reviewed and all are negative for acute change except as noted in the HPI.   Allergies  Indomethacin; Buspar; Codeine; Donepezil hcl; Penicillins; and Sulfonamide derivatives  Home Medications   Prior to Admission medications   Medication Sig Start Date End Date Taking? Authorizing Provider  acetaminophen (TYLENOL) 325 MG tablet Take 650 mg by mouth every 6 (six) hours as needed for pain.   Yes Historical Provider, MD  AZOPT 1 % ophthalmic suspension Place 1 drop into both eyes 2 (two) times daily.  11/15/10  Yes Historical Provider, MD  bimatoprost (LUMIGAN) 0.01 % SOLN Place 1 drop into both eyes at bedtime.   Yes Historical Provider,  MD  bimatoprost (LUMIGAN) 0.03 % ophthalmic solution Place 1 drop into both eyes at bedtime.   Yes Historical Provider, MD  citalopram (CELEXA) 10 MG tablet Take 10 mg by mouth daily.   Yes Historical Provider, MD  docusate sodium (COLACE) 100 MG capsule Take 100 mg by mouth daily.   Yes Historical Provider, MD  ferrous sulfate 325 (65 FE) MG tablet Take 325 mg by mouth daily with breakfast.   Yes Historical Provider, MD  furosemide (LASIX) 40 MG tablet TAKE 1 TABLET BY MOUTH ONCE DAILY ALONG WITH 20MG  TABLET TO = 60 MG. 03/26/14  Yes Luke K Kilroy, PA-C  METOPROLOL TARTRATE PO Take 25 mg by mouth daily.   Yes Historical Provider, MD  Multiple Vitamins-Minerals (ICAPS AREDS 2) CAPS Take 1 tablet by mouth 2 (two) times daily.   Yes Historical Provider, MD  sodium chloride (MURO 128) 2 % ophthalmic solution Place 1 drop into the left eye.   Yes Historical Provider, MD  ALPRAZolam Duanne Moron) 0.5 MG tablet Take 1 tablet (0.5 mg total) by mouth 2 (two) times daily as needed for anxiety (for panic attack). 02/07/14   Donne Hazel, MD  citalopram (CELEXA) 20 MG tablet Take 1 tablet (20 mg total) by mouth daily. 03/13/14   Dorena Cookey, MD  metoprolol succinate (TOPROL-XL) 25 MG 24 hr tablet TAKE 1 TABLET BY MOUTH ONCE A DAY. Patient not taking: Reported on 06/30/2015 02/05/14   Dorena Cookey, MD  nitroGLYCERIN (NITROSTAT) 0.4 MG SL tablet Place 1 tablet (0.4 mg total) under the tongue every 5 (five) minutes as needed for chest pain (up to 3 doses). 02/04/13   Dorena Cookey, MD  ondansetron (ZOFRAN) 4 MG tablet as needed. 08/19/14   Historical Provider, MD  Polyethyl Glycol-Propyl Glycol (SYSTANE ULTRA OP) Apply 1 drop to eye 4 (four) times daily as needed (dry eyes).    Historical Provider, MD  potassium chloride SA (K-DUR,KLOR-CON) 20 MEQ tablet TAKE 2 TABLETS BY MOUTH ONCE DAILY. Patient taking differently: TAKE1 TABLET  BY MOUTH TWICE DAILY 01/09/14   Dorena Cookey, MD   BP 170/95 mmHg  Pulse 86  Temp(Src)  97.7 F (36.5 C) (Oral)  Resp 17  Ht 5\' 6"  (1.676 m)  Wt 76.658 kg  BMI 27.29 kg/m2  SpO2 95%   Physical Exam  Constitutional: No distress.  Elderly   HENT:  Head: Normocephalic and atraumatic.  Right Ear: External ear normal.  Left Ear: External ear normal.  Bruising noted to left lateral aspect of tongue  Eyes: Conjunctivae are normal. Right eye exhibits no discharge. Left eye exhibits no discharge. No scleral icterus.  Neck: Neck supple.  No tracheal deviation present.  Cardiovascular: Normal rate, regular rhythm and intact distal pulses.   Pulmonary/Chest: Effort normal and breath sounds normal. No stridor. No respiratory distress. She has no wheezes. She has no rales.  Abdominal: Soft. Bowel sounds are normal. She exhibits no distension. There is no tenderness. There is no rebound and no guarding.  Musculoskeletal: She exhibits edema (trace BL). She exhibits no tenderness.  Neurological: She is alert. She has normal strength. No cranial nerve deficit (no facial droop, extraocular movements intact, no slurred speech) or sensory deficit. She exhibits normal muscle tone. She displays no seizure activity. Coordination normal.  Skin: Skin is warm and dry. No rash noted.  Psychiatric: She has a normal mood and affect.  Nursing note and vitals reviewed.   ED Course  Procedures  DIAGNOSTIC STUDIES:  Oxygen Saturation is 94% on RA, adequate by my interpretation.    COORDINATION OF CARE:  1:43 PM Will order BMP, CBC, troponin, BNP and chest x-ray. Discussed treatment plan with pt at bedside and pt agreed to plan.  Labs Review Labs Reviewed  BASIC METABOLIC PANEL - Abnormal; Notable for the following:    Calcium 8.4 (*)    All other components within normal limits  CBC WITH DIFFERENTIAL/PLATELET - Abnormal; Notable for the following:    RBC 3.55 (*)    Hemoglobin 10.7 (*)    HCT 34.8 (*)    RDW 15.8 (*)    All other components within normal limits  TROPONIN I - Abnormal;  Notable for the following:    Troponin I 0.05 (*)    All other components within normal limits  BRAIN NATRIURETIC PEPTIDE - Abnormal; Notable for the following:    B Natriuretic Peptide 807.2 (*)    All other components within normal limits    Imaging Review Dg Chest 2 View  06/30/2015  CLINICAL DATA:  Shortness of breath for 2 days.  Weakness. EXAM: CHEST  2 VIEW COMPARISON:  03/31/2014 FINDINGS: Cardiomegaly. Changes of pulmonary fibrosis in the lungs. More prominent opacities noted in the lungs bilaterally could reflect progressive fibrosis/chronic changes or superimposed acute edema. No acute bony abnormality. IMPRESSION: Cardiomegaly. Underlying chronic lung disease/ fibrosis. More pronounced interstitial and airspace opacities in the lower lung zones could reflect progressive fibrosis or superimposed acute pulmonary edema. Electronically Signed   By: Rolm Baptise M.D.   On: 06/30/2015 14:31   I have personally reviewed and evaluated these images and lab results as part of my medical decision-making.   EKG Interpretation   Date/Time:  Tuesday June 30 2015 13:12:39 EDT Ventricular Rate:  91 PR Interval:    QRS Duration: 142 QT Interval:  409 QTC Calculation: 504 R Axis:   7 Text Interpretation:  Sinus rhythm Multiple ventricular premature  complexes Probable left atrial enlargement Right bundle branch block  Baseline wander in lead(s) II III aVF No significant change since last  tracing Confirmed by Frenchie Pribyl  MD-J, Bayne Fosnaugh [id number]) on 06/30/2015 1:17:09 PM      MDM   Final diagnoses:  Acute on chronic congestive heart failure, unspecified congestive heart failure type (HCC)    Patient's laboratory tests show an elevated troponin and BNP. The patient does not experience any chest pain. I discussed the findings with the patient and her daughter. They do not want any aggressive care but agreed to tonight treatment with diuresis and serial enzymes.  I personally performed the services  described in this documentation, which was scribed in my presence.  The recorded  information has been reviewed and is accurate.    Dorie Rank, MD 06/30/15 [phone]

## 2015-06-30 NOTE — ED Notes (Signed)
Pt. Comes in from [facility] with [facility] complaining of weakness and fatigue. [facility] reports hypertension this AM. Pt. Denies any pain and appears A/Ox4. 12 lead showed RBBB

## 2015-06-30 NOTE — ED Notes (Signed)
Critical labs told to JK

## 2015-07-01 DIAGNOSIS — Z7189 Other specified counseling: Secondary | ICD-10-CM | POA: Insufficient documentation

## 2015-07-01 DIAGNOSIS — I5041 Acute combined systolic (congestive) and diastolic (congestive) heart failure: Secondary | ICD-10-CM

## 2015-07-01 DIAGNOSIS — Z515 Encounter for palliative care: Secondary | ICD-10-CM

## 2015-07-01 DIAGNOSIS — I5023 Acute on chronic systolic (congestive) heart failure: Secondary | ICD-10-CM

## 2015-07-01 DIAGNOSIS — I4581 Long QT syndrome: Secondary | ICD-10-CM | POA: Diagnosis not present

## 2015-07-01 LAB — BASIC METABOLIC PANEL
Anion gap: 9 (ref 5–15)
BUN: 16 mg/dL (ref 6–20)
CHLORIDE: 101 mmol/L (ref 101–111)
CO2: 27 mmol/L (ref 22–32)
CREATININE: 1.02 mg/dL — AB (ref 0.44–1.00)
Calcium: 8.2 mg/dL — ABNORMAL LOW (ref 8.9–10.3)
GFR calc Af Amer: 54 mL/min — ABNORMAL LOW (ref >60)
GFR calc non Af Amer: 46 mL/min — ABNORMAL LOW (ref >60)
Glucose, Bld: 98 mg/dL (ref 65–99)
Potassium: 3.7 mmol/L (ref 3.5–5.1)
SODIUM: 137 mmol/L (ref 135–145)

## 2015-07-01 LAB — CBC
HCT: 32.1 % — ABNORMAL LOW (ref 36.0–46.0)
Hemoglobin: 10 g/dL — ABNORMAL LOW (ref 12.0–15.0)
MCH: 30.5 pg (ref 26.0–34.0)
MCHC: 31.2 g/dL (ref 30.0–36.0)
MCV: 97.9 fL (ref 78.0–100.0)
PLATELETS: 151 10*3/uL (ref 150–400)
RBC: 3.28 MIL/uL — ABNORMAL LOW (ref 3.87–5.11)
RDW: 15.7 % — AB (ref 11.5–15.5)
WBC: 7.8 10*3/uL (ref 4.0–10.5)

## 2015-07-01 LAB — MAGNESIUM: Magnesium: 2 mg/dL (ref 1.7–2.4)

## 2015-07-01 LAB — MRSA PCR SCREENING: MRSA by PCR: NEGATIVE

## 2015-07-01 LAB — TROPONIN I
Troponin I: 0.06 ng/mL (ref <0.03)
Troponin I: 0.06 ng/mL (ref <0.03)

## 2015-07-01 MED ORDER — FUROSEMIDE 10 MG/ML IJ SOLN
20.0000 mg | Freq: Once | INTRAMUSCULAR | Status: AC
  Administered 2015-07-01: 20 mg via INTRAVENOUS
  Filled 2015-07-01: qty 2

## 2015-07-01 MED ORDER — FUROSEMIDE 40 MG PO TABS: 40.0000 mg | ORAL_TABLET | Freq: Two times a day (BID) | ORAL | Status: AC

## 2015-07-01 NOTE — Progress Notes (Signed)
Received consult-  Please contact [facility]: patient connected with palliative services through [place]. Needs full hospice enrollment after discharge,   Hospice and Palliative Care called, talked to Katharine Look; she stated that [facility] NP had contacted them for the referral and that they have contacted the facility - [facility] for the official referral for consultation . Mindi Slicker [facility] [phone]

## 2015-07-01 NOTE — Care Management Obs Status (Signed)
[place] NOTIFICATION   Patient Details  Name: AVRIELLE DEFRAIN MRN: [id number] Date of Birth: Aug 30, 1920   Medicare Observation Status Notification Given:  Yes    Royston Bake, RN 07/01/2015, 10:36 AM

## 2015-07-01 NOTE — Progress Notes (Signed)
Pt had a 18 runs beats of V-tach, pt is asymptomatic and resting, Paged to MD. Will continue to monitor the patient.

## 2015-07-01 NOTE — Progress Notes (Signed)
Report given to [place] at [facility].

## 2015-07-01 NOTE — NC FL2 (Signed)
[place] LEVEL OF CARE SCREENING TOOL     IDENTIFICATION  Patient Name: [name] Birthdate: 1920-07-22 Sex: female Admission Date (Current Location): 06/30/2015  [facility] and [place] Number:  Herbalist and Address:  [facility] [place], [place] [address] [place], [place] [address]      Provider Number: [id number]  Attending Physician Name and Address:  Domenic Polite, MD  Relative Name and Phone Number:  Virgina Evener - daughter. [phone]    Current Level of Care: Hospital Recommended Level of Care: [facility]) Prior Approval Number:    Date Approved/Denied:   PASRR Number:    Discharge Plan: Other (Comment) ([place])    Current Diagnoses: Patient Active Problem List   Diagnosis Date Noted  . Palliative care encounter   . Advance care planning   . Goals of care, counseling/discussion   . CHF (congestive heart failure) ([place]) 06/30/2015  . Depression 06/30/2015  . Lethargy 06/30/2015  . Elevated troponin 06/30/2015  . Prolonged Q-T interval on ECG 06/30/2015  . HTN (hypertension) 06/30/2015  . Acute on chronic congestive heart failure ([place])   . Rectal bleeding 03/31/2014  . Hyperglycemia 03/31/2014  . Panic attack 02/07/2014  . Pulmonary hypertension ([place]) 01/30/2014  . Congestive dilated cardiomyopathy ([place]) 01/30/2014  . Physical deconditioning   . Combined congestive systolic and diastolic heart failure ([place]) 12/10/2013  . Elevated brain natriuretic peptide (BNP) level   . Dyspnea 12/09/2013  . Normocytic anemia 12/09/2013  . Swelling of lower extremity 12/09/2013  . Right lower lobe pneumonia 09/30/2013  . Short-term memory loss 02/04/2013  . Depression, involutional ([place]) 02/04/2013  . Pain in left shoulder 12/20/2010  . Pain in left knee 12/20/2010  . Deafness 12/20/2010  . Left arm numbness 10/11/2010  . Cancer of descending colon ([place]) 07/09/2010   . Colon cancer ([place]) 06/02/2010  . INCONTINENCE, FEMALE STRESS 10/13/2009  . PULMONARY FIBROSIS ILD POST INFLAMMATORY CHRONIC 08/15/2008  . Coronary atherosclerosis 10/03/2007  . SEBORRHEIC KERATOSIS, INFLAMED 11/10/2006  . Essential hypertension 07/31/2006  . BREAST CANCER, HX OF 07/31/2006    Orientation RESPIRATION BLADDER Height & Weight     Self, Time, Situation, Place  Normal Continent Weight: 154 lb 8 oz (70.081 kg) (Scale A) Height:  5\' 6"  (167.6 cm)  BEHAVIORAL SYMPTOMS/MOOD NEUROLOGICAL BOWEL NUTRITION STATUS      Continent Diet (Low sodium - Heart healthy)  AMBULATORY STATUS COMMUNICATION OF NEEDS Skin   Supervision Verbally Normal                       Personal Care Assistance Level of Assistance  Bathing, Feeding, Dressing Bathing Assistance: Limited assistance Feeding assistance: Limited assistance Dressing Assistance: Limited assistance     Functional Limitations Info  Sight, Hearing, Speech Sight Info: Adequate Hearing Info: Impaired Speech Info: Adequate    SPECIAL CARE FACTORS FREQUENCY                       Contractures Contractures Info: Not present    Additional Factors Info  Code Status, Allergies Code Status Info: DNR Allergies Info: Indomethacin, Buspar, Codeine, Donepezil Hcll, Penicillins, Sulfonamide Derivatives           Current Medications (07/01/2015):  This is the current hospital active medication list Current Facility-Administered Medications  Medication Dose Route Frequency Provider Last Rate Last Dose  . acetaminophen (TYLENOL) tablet 650 mg  650 mg  Oral Q6H PRN Willia Craze, NP       Or  . acetaminophen (TYLENOL) suppository 650 mg  650 mg Rectal Q6H PRN Willia Craze, NP      . ALPRAZolam Duanne Moron) tablet 0.5 mg  0.5 mg Oral BID PRN Willia Craze, NP      . bisacodyl (DULCOLAX) EC tablet 5 mg  5 mg Oral Daily PRN Willia Craze, NP      . brinzolamide (AZOPT) 1 % ophthalmic suspension 1 drop  1 drop  Both Eyes BID Willia Craze, NP   1 drop at 07/01/15 0953  . citalopram (CELEXA) tablet 10 mg  10 mg Oral Daily Willia Craze, NP   10 mg at 07/01/15 0953  . docusate sodium (COLACE) capsule 100 mg  100 mg Oral Daily Willia Craze, NP   100 mg at 07/01/15 0953  . enoxaparin (LOVENOX) injection 40 mg  40 mg Subcutaneous Q24H Willia Craze, NP   40 mg at 06/30/15 1900  . hydrALAZINE (APRESOLINE) injection 10 mg  10 mg Intravenous Q8H PRN Willia Craze, NP      . metoprolol tartrate (LOPRESSOR) tablet 25 mg  25 mg Oral Daily Willia Craze, NP   25 mg at 07/01/15 0953  . nitroGLYCERIN (NITROSTAT) SL tablet 0.4 mg  0.4 mg Sublingual Q5 min PRN Willia Craze, NP      . sodium chloride (MURO 128) 2 % ophthalmic solution 1 drop  1 drop Left Eye Q3H PRN Willia Craze, NP         Discharge Medications: Please see discharge summary for a list of discharge medications.  Relevant Imaging Results:  Relevant Lab Results:   Additional Information DISCHARGE MEDS THAT HAVE CHANGED: furosemide 40 MG-take 1 tablet 2 times daily. CONTINUE THESE MEDS: acetaminophen 325 mg-take 650 mg every 6 hrs prn for pain; azopt 1% ophthalmic suspension-place 1 drop into both eyes 2 times daily; bimatoprost 0.01% SOLN-place 1 drop into both eyes at bedtime; bimatoprost 0.03% ophthalmic solution-place 1 drop into both eyes at bedtime; docusate sodiium 100 mg capsule-take 100 mg daily; ferrous sulfate 325 mg tablet-take 325 mg daily w/breakfast; multiple vitamins-minerals caps-take 1 tablet 2 times daily; sodium chloride 2% ophthalmic solution-place 1 drop into the left eye; alprazolam 0.5 mg tablet-take 1 tablet 2 times daily prn for anxiety (panic attacks); citalopram 20 mg tablet-take 1 tablet daily; metoprolol succinate 25 mg 24 hr tablet-take 1 tablet once daily; nitroglycerin 0.4 mg SL tablet-place 1 tablet under the tongue every 5 minutes prn for chest pain; polyethyl glycol-propyl glycol-apply 1 drop  to eye 4 times daily prn (dry eyes); potassium chloride SA 20 meq talbet-take 2 tablets once daily. STOP TAKING THESE MEDS: Metoprolol Tartrate PO, Ondansetron 4 mg tablet.  Sable Feil, LCSW

## 2015-07-01 NOTE — Discharge Summary (Signed)
Physician Discharge Summary  [name] [id number] DOB: 12-12-1920 DOA: 06/30/2015  PCP: No primary care provider on file.  Admit date: 06/30/2015 Discharge date: 07/01/2015  Time spent: 45 minutes  Recommendations for Outpatient Follow-up:  1. Palliative FU at ALF as before   Discharge Diagnoses:    Acute on chronic systolic CHF   Combined congestive systolic and diastolic heart failure (HCC)   Elevated troponin   Depression   Lethargy   Elevated troponin   Prolonged Q-T interval on ECG   HTN (hypertension)   Acute on chronic congestive heart failure [facility])   Discharge Condition: stable  Diet recommendation: heart healthy  Filed Weights   06/30/15 1311 06/30/15 1739 07/01/15 0543  Weight: 76.658 kg (169 lb) 75.433 kg (166 lb 4.8 oz) 70.081 kg (154 lb 8 oz)    History of present illness:  Chief Complaint: Fatigue  HPI: [name] is a 82 y.o. female with medical history significant for but not limited to coronary artery disease, pulmonary fibrosis, stage III colon cancer 2012 status post colon resection. Patient transported from nursing home this morning after staff and rounding physician found her to be lethargic sleeping at dinner table. BP noted at NH to be elevated. . Daughter is at bedside, helps with history. Patient has no complaints. She admits to being chronically short of breath with exertion  Hospital Course:  Lethargy, acute.  -yesterday reportedly at ALF, now resolved by the time she was seen in ER -could have been due to transient hypoxia etc  Elevated trop - 0.05. No chest pain. EKG unchanged from previous.  -No aggressive measures per daughter -remained stable in 0.05-0.06 range, not ACS pattern   Acute on chronic heart failure. Patient has history of combined congestive systolic and diastolic heart failure and chronic dyspnea. Echo Dec 2015 with EF of 45-50%. Severe LVH pattern.  -some chronic dyspnea with bilateral peripheral  edema, elevated BNP and suggestion of pulmonary edema on cxr. -diuresed with 2 doses of IV lasix, improved -takes 60mg  daily of PO lasix at home increased to 40mg  BID at discharge -D/w Palliative medicine Dr.Anwar who recommended FU at ALF with Palliative NP Tommy like she is followed prior to admission and consideration of Hospice when she deteriorates significantly  Hypertension. BP elevated in ED.  -continue home metoprolol.  -improved with diuresis  History of stage III colon cancer. Status post left hemicolectomy with primary anastomosis May 2012. Patient had limited chemo then opted out of treatment. Followed outpatient by Palliative  prolonged QTc interval on EKG -Avoid QT prolonging medications, stopped Zofran, also on low dose which will be continued for now, avoid escalation  Depression, stable. . -Continue home citalopram  Anxiety -Continue when necessary xanax  Consultations: Palliative medicine Dr.Anwar  Discharge Exam: Filed Vitals:   07/01/15 0543 07/01/15 0952  BP: 162/62 149/81  Pulse: 73 77  Temp: 98.6 F (37 C)   Resp: 20     General: AAOx3 Cardiovascular: S1S2/RRR Respiratory: CTAB  Discharge Instructions   Discharge Instructions    Diet - low sodium heart healthy    Complete by:  As directed      Increase activity slowly    Complete by:  As directed           Current Discharge Medication List    CONTINUE these medications which have CHANGED   Details  furosemide (LASIX) 40 MG tablet Take 1 tablet (40 mg total) by mouth 2 (two) times daily. Refills: 0  CONTINUE these medications which have NOT CHANGED   Details  acetaminophen (TYLENOL) 325 MG tablet Take 650 mg by mouth every 6 (six) hours as needed for pain.    AZOPT 1 % ophthalmic suspension Place 1 drop into both eyes 2 (two) times daily.    Associated Diagnoses: Malignant neoplasm of colon, unspecified site; Colon cancer (HCC)    bimatoprost (LUMIGAN) 0.01 % SOLN Place 1  drop into both eyes at bedtime.    bimatoprost (LUMIGAN) 0.03 % ophthalmic solution Place 1 drop into both eyes at bedtime.    docusate sodium (COLACE) 100 MG capsule Take 100 mg by mouth daily.    ferrous sulfate 325 (65 FE) MG tablet Take 325 mg by mouth daily with breakfast.    Multiple Vitamins-Minerals (ICAPS AREDS 2) CAPS Take 1 tablet by mouth 2 (two) times daily.    sodium chloride (MURO 128) 2 % ophthalmic solution Place 1 drop into the left eye.    ALPRAZolam (XANAX) 0.5 MG tablet Take 1 tablet (0.5 mg total) by mouth 2 (two) times daily as needed for anxiety (for panic attack). Qty: 30 tablet, Refills: 0    citalopram (CELEXA) 20 MG tablet Take 1 tablet (20 mg total) by mouth daily. Qty: 100 tablet, Refills: 3    metoprolol succinate (TOPROL-XL) 25 MG 24 hr tablet TAKE 1 TABLET BY MOUTH ONCE A DAY. Qty: 30 tablet, Refills: 11    nitroGLYCERIN (NITROSTAT) 0.4 MG SL tablet Place 1 tablet (0.4 mg total) under the tongue every 5 (five) minutes as needed for chest pain (up to 3 doses). Qty: 25 tablet, Refills: 3    Polyethyl Glycol-Propyl Glycol (SYSTANE ULTRA OP) Apply 1 drop to eye 4 (four) times daily as needed (dry eyes).    potassium chloride SA (K-DUR,KLOR-CON) 20 MEQ tablet TAKE 2 TABLETS BY MOUTH ONCE DAILY. Qty: 30 tablet, Refills: 5      STOP taking these medications     METOPROLOL TARTRATE PO      ondansetron (ZOFRAN) 4 MG tablet        Allergies  Allergen Reactions  . Indomethacin Other (See Comments)    REACTION: TIA type episodes  . Buspar [Buspirone] Other (See Comments)    DIZZINESS AND CONFUSION  . Codeine Nausea And Vomiting and Other (See Comments)    REACTION: Stomach cramps  . Donepezil Hcl Other (See Comments)    DIZZINESS AND CONFUSION  . Penicillins Nausea And Vomiting  . Sulfonamide Derivatives Nausea And Vomiting   Follow-up Information    Follow up with [facility].   Specialty:  PALLIATIVE CARE   Why:  at  the ALF   Contact information:   [address] [id number] [place] [phone]       The results of significant diagnostics from this hospitalization (including imaging, microbiology, ancillary and laboratory) are listed below for reference.    Significant Diagnostic Studies: Dg Chest 2 View  06/30/2015  CLINICAL DATA:  Shortness of breath for 2 days.  Weakness. EXAM: CHEST  2 VIEW COMPARISON:  03/31/2014 FINDINGS: Cardiomegaly. Changes of pulmonary fibrosis in the lungs. More prominent opacities noted in the lungs bilaterally could reflect progressive fibrosis/chronic changes or superimposed acute edema. No acute bony abnormality. IMPRESSION: Cardiomegaly. Underlying chronic lung disease/ fibrosis. More pronounced interstitial and airspace opacities in the lower lung zones could reflect progressive fibrosis or superimposed acute pulmonary edema. Electronically Signed   By: Rolm Baptise M.D.   On: 06/30/2015 14:31  Microbiology: Recent Results (from the past 240 hour(s))  MRSA PCR Screening     Status: None   Collection Time: 06/30/15  7:16 PM  Result Value Ref Range Status   MRSA by PCR NEGATIVE NEGATIVE Final    Comment:        The GeneXpert MRSA Assay (FDA approved for NASAL specimens only), is one component of a comprehensive MRSA colonization surveillance program. It is not intended to diagnose MRSA infection nor to guide or monitor treatment for MRSA infections.      Labs: Basic Metabolic Panel:  Recent Labs Lab 06/30/15 1319 07/01/15 0436  NA 136 137  K 5.0 3.7  CL 105 101  CO2 26 27  GLUCOSE 96 98  BUN 11 16  CREATININE 0.81 1.02*  CALCIUM 8.4* 8.2*  MG  --  2.0   Liver Function Tests: No results for input(s): AST, ALT, ALKPHOS, BILITOT, PROT, ALBUMIN in the last 168 hours. No results for input(s): LIPASE, AMYLASE in the last 168 hours. No results for input(s): AMMONIA in the last 168 hours. CBC:  Recent  Labs Lab 06/30/15 1319 07/01/15 0436  WBC 9.0 7.8  NEUTROABS 6.1  --   HGB 10.7* 10.0*  HCT 34.8* 32.1*  MCV 98.0 97.9  PLT 170 151   Cardiac Enzymes:  Recent Labs Lab 06/30/15 1319 06/30/15 1805 06/30/15 2336 07/01/15 0436  TROPONINI 0.05* 0.05* 0.06* 0.06*   BNP: BNP (last 3 results)  Recent Labs  06/30/15 1319  BNP 807.2*    ProBNP (last 3 results) No results for input(s): PROBNP in the last 8760 hours.  CBG: No results for input(s): GLUCAP in the last 168 hours.     SignedDomenic Polite MD.  [facility] 07/01/2015, 12:44 PM

## 2015-07-01 NOTE — Consult Note (Signed)
Consultation Note Date: 07/01/2015   Patient Name: [name]  DOB: October 16, 1920  MRN: [id number]  Age / Sex: 82 y.o., female  PCP: No primary care provider on file. Referring Physician: Domenic Polite, MD  Reason for Consultation: Establishing goals of care and Hospice Evaluation  HPI/Patient Profile: 82 y.o. female  with past medical history of breast ca, HTN, CAD, pulmonary fibrosis, hiatal hernia, PUD, colon ca (s/p colectomy, no chemo or radtx), esophogeal strictures, and NSTEMI admitted on 06/30/2015 with increasing lethargy, dysnpnea, and elevated serial troponins and BNP consistent with decompensated heart failure. Last ECHO in December 2015 showed diffuse hypokinesis with EF of 45-50%. Prior to admission she was residing in independent living at [facility] and was completing ADL's with some increasing assistance. Her daughter, [name], notes that she has lost approx 10 pounds in the last few months. [name] reports that [facility] had orders to give [name] lasix should she gain two pounds, however, [name] believes that the weight loss could have hidden possible weight gain from fluid collection. She has also had some decrease in her cognitive functioning with declining memory. [name] also reports that recently [name] had stopped dressing herself in the mornings. It is unclear if this was due to fatigue or to cognitive changes.   Clinical Assessment and Goals of Care: Met with patient and daughter, [name] at bedside. Patient was pleasant, alert and conversational, although confused at times. This confusion appeared to frustrate her a little. [name] is a Psychologist, occupational for Hospice and Ms. Taliaferro has been receiving Palliative services from [facility] with visits from [facility] NP Vivien Rota. Ann and patient are clear that they do not want any life prolonging measures. Patient is DNR and this is appropriate. I believe that due to her  declining functional status, worsening heart failure requiring hospitalization, increasing cognitive deficits, and the patient's and family's desires for no aggressive treatment, would make [name] appropriate to transition to Hospice at this time per the discretion of an evaluation by a Hospice organization. [name] and [name] both expressed they would like to return to [facility] ASAP with assistance from [facility] with whom they are already established.  NEXT OF KIN- daughterLelon Mills    SUMMARY OF RECOMMENDATIONS     Code Status/Advance Care Planning:  DNR    Symptom Management:   Patient appears comfortable at this time. Would recommend ongoing assessments for SOB and anxiety.   Additional Recommendations (Limitations, Scope, Preferences):  Avoid Hospitalization, No Artificial Feeding, No Chemotherapy, No Diagnostics, No Hemodialysis, No Surgical Procedures and No Tracheostomy  Psycho-social/Spiritual:   Desire for further Chaplaincy support: will offer at future meeting  Additional Recommendations: Education on Hospice   Prognosis:   < 6 months d/t declining functional status, weight loss, dementia, worsening CHF with no aggressive treatment  Discharge Planning: Home with Hospice - will ask PCM RN- Melanie to contact facility and [facility] to arrange evaluation for possible transition to Hospice care      Primary Diagnoses: Present on Admission:  .  Combined congestive systolic and diastolic heart failure ([place])  I have reviewed the medical record, interviewed the patient and family, and examined the patient. The following aspects are pertinent.  Past Medical History  Diagnosis Date  . Breast cancer ([place])     lumpectomy followed by RT and Tamoxifen x 5 years  . Hypertension   . CAD (coronary artery disease)     a. Mod nonobstructive by cath 2006. b. stress test 2007- no evidence of ischemia; EF 58%  . Dermatitis   . Female stress incontinence   . Seborrheic keratosis,  inflamed   . Pulmonary fibrosis ([place])   . Hearing loss   . Erosive esophagitis 05/19/10  . Hiatal hernia 05/19/10    5 cm  . Anemia   . Diverticulosis 05/19/10    moderate  . Anxiety   . Colon cancer ([place])     a. 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 Tx with Xeloda.  . Stricture esophagus 06/08/10    EGD  . Gastritis   . NSTEMI (non-ST elevated myocardial infarction) [place]     [place]  . Premature atrial contractions   . Pulmonary fibrosis [facility])    Social History   Social History  . Marital Status: Divorced    Spouse Name: N/A  . Number of Children: N/A  . Years of Education: N/A   Occupational History  . retired    Social History Main Topics  . Smoking status: Former Smoker    Types: Cigarettes    Quit date: 01/04/1983  . Smokeless tobacco: Never Used     Comment: Quit 25-30 years ago  . Alcohol Use: 7.0 oz/week    14 Standard drinks or equivalent per week     Comment: a drink a night, a little scotch helps you  . Drug Use: No  . Sexual Activity: No   Other Topics Concern  . None   Social History Narrative   Family History  Problem Relation Age of Onset  . Heart disease Mother   . Colon polyps Daughter   . Colon cancer Brother     in 71s  . Breast cancer Daughter   . Kidney failure Father    Scheduled Meds: . brinzolamide  1 drop Both Eyes BID  . citalopram  10 mg Oral Daily  . docusate sodium  100 mg Oral Daily  . enoxaparin (LOVENOX) injection  40 mg Subcutaneous Q24H  . metoprolol tartrate  25 mg Oral Daily   Continuous Infusions:  PRN Meds:.acetaminophen **OR** acetaminophen, ALPRAZolam, bisacodyl, hydrALAZINE, nitroGLYCERIN, sodium chloride Medications Prior to Admission:  Prior to Admission medications   Medication Sig Start Date End Date Taking? Authorizing Provider  acetaminophen (TYLENOL) 325 MG tablet Take 650 mg by mouth every 6 (six)  hours as needed for pain.   Yes Historical Provider, MD  AZOPT 1 % ophthalmic suspension Place 1 drop into both eyes 2 (two) times daily.  11/15/10  Yes Historical Provider, MD  bimatoprost (LUMIGAN) 0.01 % SOLN Place 1 drop into both eyes at bedtime.   Yes Historical Provider, MD  bimatoprost (LUMIGAN) 0.03 % ophthalmic solution Place 1 drop into both eyes at bedtime.   Yes Historical Provider, MD  citalopram (CELEXA) 10 MG tablet Take 10 mg by mouth daily.   Yes Historical Provider, MD  docusate sodium (COLACE) 100 MG capsule Take 100 mg by mouth daily.   Yes Historical Provider,  MD  ferrous sulfate 325 (65 FE) MG tablet Take 325 mg by mouth daily with breakfast.   Yes Historical Provider, MD  furosemide (LASIX) 40 MG tablet TAKE 1 TABLET BY MOUTH ONCE DAILY ALONG WITH 20MG TABLET TO = 60 MG. 03/26/14  Yes Luke K Kilroy, PA-C  METOPROLOL TARTRATE PO Take 25 mg by mouth daily.   Yes Historical Provider, MD  Multiple Vitamins-Minerals (ICAPS AREDS 2) CAPS Take 1 tablet by mouth 2 (two) times daily.   Yes Historical Provider, MD  sodium chloride (MURO 128) 2 % ophthalmic solution Place 1 drop into the left eye.   Yes Historical Provider, MD  ALPRAZolam Duanne Moron) 0.5 MG tablet Take 1 tablet (0.5 mg total) by mouth 2 (two) times daily as needed for anxiety (for panic attack). 02/07/14   Donne Hazel, MD  citalopram (CELEXA) 20 MG tablet Take 1 tablet (20 mg total) by mouth daily. 03/13/14   Dorena Cookey, MD  metoprolol succinate (TOPROL-XL) 25 MG 24 hr tablet TAKE 1 TABLET BY MOUTH ONCE A DAY. Patient not taking: Reported on 06/30/2015 02/05/14   Dorena Cookey, MD  nitroGLYCERIN (NITROSTAT) 0.4 MG SL tablet Place 1 tablet (0.4 mg total) under the tongue every 5 (five) minutes as needed for chest pain (up to 3 doses). 02/04/13   Dorena Cookey, MD  ondansetron (ZOFRAN) 4 MG tablet as needed. 08/19/14   Historical Provider, MD  Polyethyl Glycol-Propyl Glycol (SYSTANE ULTRA OP) Apply 1 drop to eye 4 (four) times  daily as needed (dry eyes).    Historical Provider, MD  potassium chloride SA (K-DUR,KLOR-CON) 20 MEQ tablet TAKE 2 TABLETS BY MOUTH ONCE DAILY. Patient taking differently: TAKE1 TABLET  BY MOUTH TWICE DAILY 01/09/14   Dorena Cookey, MD   Allergies  Allergen Reactions  . Indomethacin Other (See Comments)    REACTION: TIA type episodes  . Buspar [Buspirone] Other (See Comments)    DIZZINESS AND CONFUSION  . Codeine Nausea And Vomiting and Other (See Comments)    REACTION: Stomach cramps  . Donepezil Hcl Other (See Comments)    DIZZINESS AND CONFUSION  . Penicillins Nausea And Vomiting  . Sulfonamide Derivatives Nausea And Vomiting   Review of Systems  Constitutional: Positive for activity change.  HENT: Positive for hearing loss.   Respiratory: Positive for shortness of breath.        With activity     Physical Exam  Constitutional: She appears well-developed. No distress.  HENT:  Hearing aid in place  Cardiovascular:  Occasional runs of V-tach  Pulmonary/Chest:  Mild dyspnea with walking to bathroom   Musculoskeletal:  Generalized weakness, ambulated with walker  Neurological: She is alert.  Confused at times   Psychiatric: She has a normal mood and affect. Her behavior is normal. Judgment and thought content normal.    Vital Signs: BP 149/81 mmHg  Pulse 77  Temp(Src) 98.6 F (37 C) (Oral)  Resp 20  Ht 5' 6"  (1.676 m)  Wt 70.081 kg (154 lb 8 oz)  BMI 24.95 kg/m2  SpO2 95% Pain Assessment: No/denies pain   Pain Score: 0-No pain   SpO2: SpO2: 95 % O2 Device:SpO2: 95 % O2 Flow Rate: .   IO: Intake/output summary:  Intake/Output Summary (Last 24 hours) at 07/01/15 1231 Last data filed at 07/01/15 0545  Gross per 24 hour  Intake    175 ml  Output   1250 ml  Net  -1075 ml    LBM: Last BM  Date: 06/29/15 Baseline Weight: Weight: 76.658 kg (169 lb) Most recent weight: Weight: 70.081 kg (154 lb 8 oz) (Scale A)     Palliative Assessment/Data: PPS:  50%     Time In: 1045 Time Out: 1200 Time Total: 75 mins Greater than 50%  of this time was spent counseling and coordinating care related to the above assessment and plan.  Thank you for this consult. Please call if there are any further needs.   Signed by: Mariana Kaufman, AGNP-C    Please contact Palliative Medicine Team phone at [phone] for questions and concerns.  For individual provider: See Shea Evans

## 2015-07-01 NOTE — Clinical Social Work Note (Signed)
Patient discharged back to [facility] today, transported by daughter who was at the bedside. Discharge clinicals transmitted to facility. Phone provided to nurse to call report.  Trevor Wilkie Givens, MSW, LCSW Licensed Clinical Social Worker [place] [phone]

## 2015-07-02 ENCOUNTER — Telehealth: Payer: Self-pay | Admitting: Cardiology

## 2015-07-02 NOTE — Telephone Encounter (Signed)
Per pt's daughter's call:   She would like a call back to touch base about her mother.

## 2015-07-02 NOTE — Telephone Encounter (Signed)
Spoke to Virgina Evener (daughter) RN information given to daughter- no appointment needed-she verbalized understanding.

## 2015-07-02 NOTE — Telephone Encounter (Signed)
I reviewed the hospital records and agree with the suggestions.  07/02/2015

## 2015-07-02 NOTE — Telephone Encounter (Signed)
Spoke with daughter,Ann  Daughter states patient ( who is 41) was recently in hospital ([place]) and discharge home with palliative care. Hospice was called in. Daughter states the last time Dr Percival Spanish saw the patient , the next visit was for prn Daughter states she is okay with the hospital diagnosis and prognosis. But she has a brother who is concerned that the information came from a hospitalist and not cardiologist. Daughter just wants know if patient needs to see Dr Percival Spanish to follow up hospital  Stay or if  Dr Percival Spanish could review the information to see if treatment is appropriate. Daughter states she is not wanting to do anything ( i.e. Procedures,tests)  RN informed daughter will defer to Dr Percival Spanish and some will contact her with  his decision She verbalized understanding.

## 2015-09-04 DEATH — deceased

## 2018-04-13 IMAGING — DX DG CHEST 2V
2 series · 2 of 2 positions shown · non-contrast
Comparison: 03/31/2014

CLINICAL DATA: Shortness of breath for 2 days.  Weakness.

EXAM:
CHEST  2 VIEW

[x chest ap]
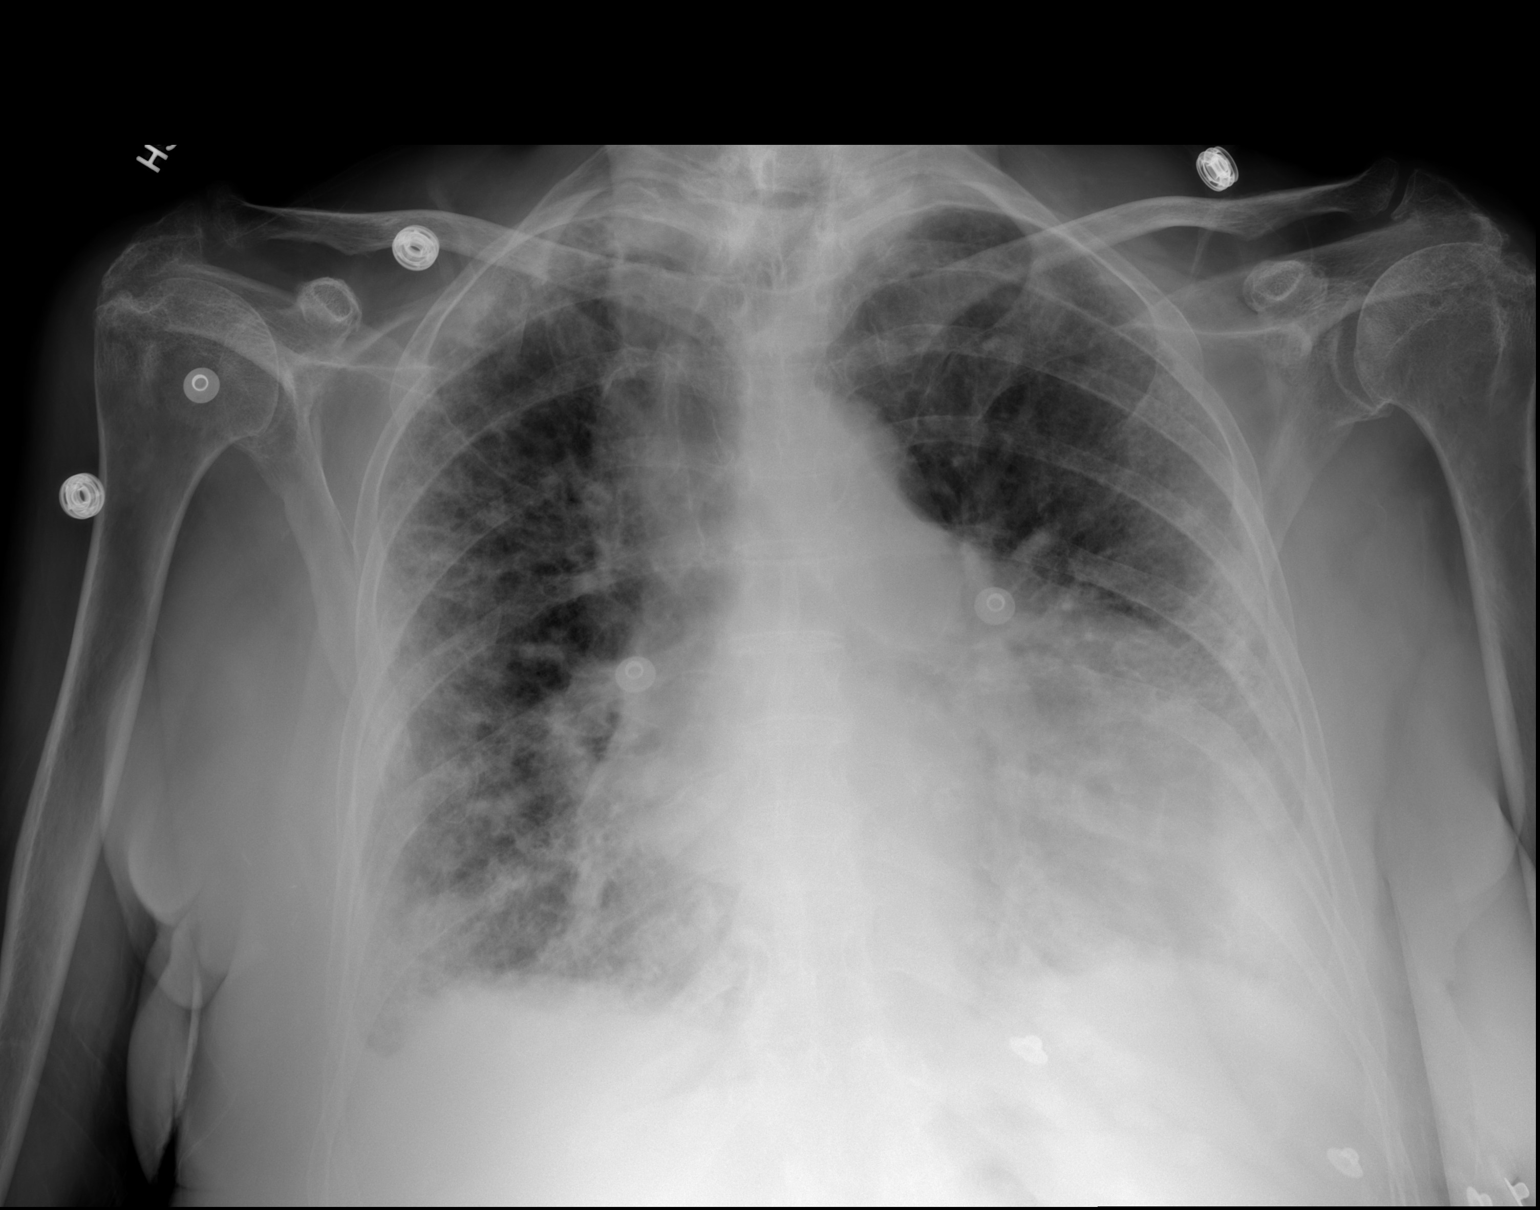

[w chest lat]
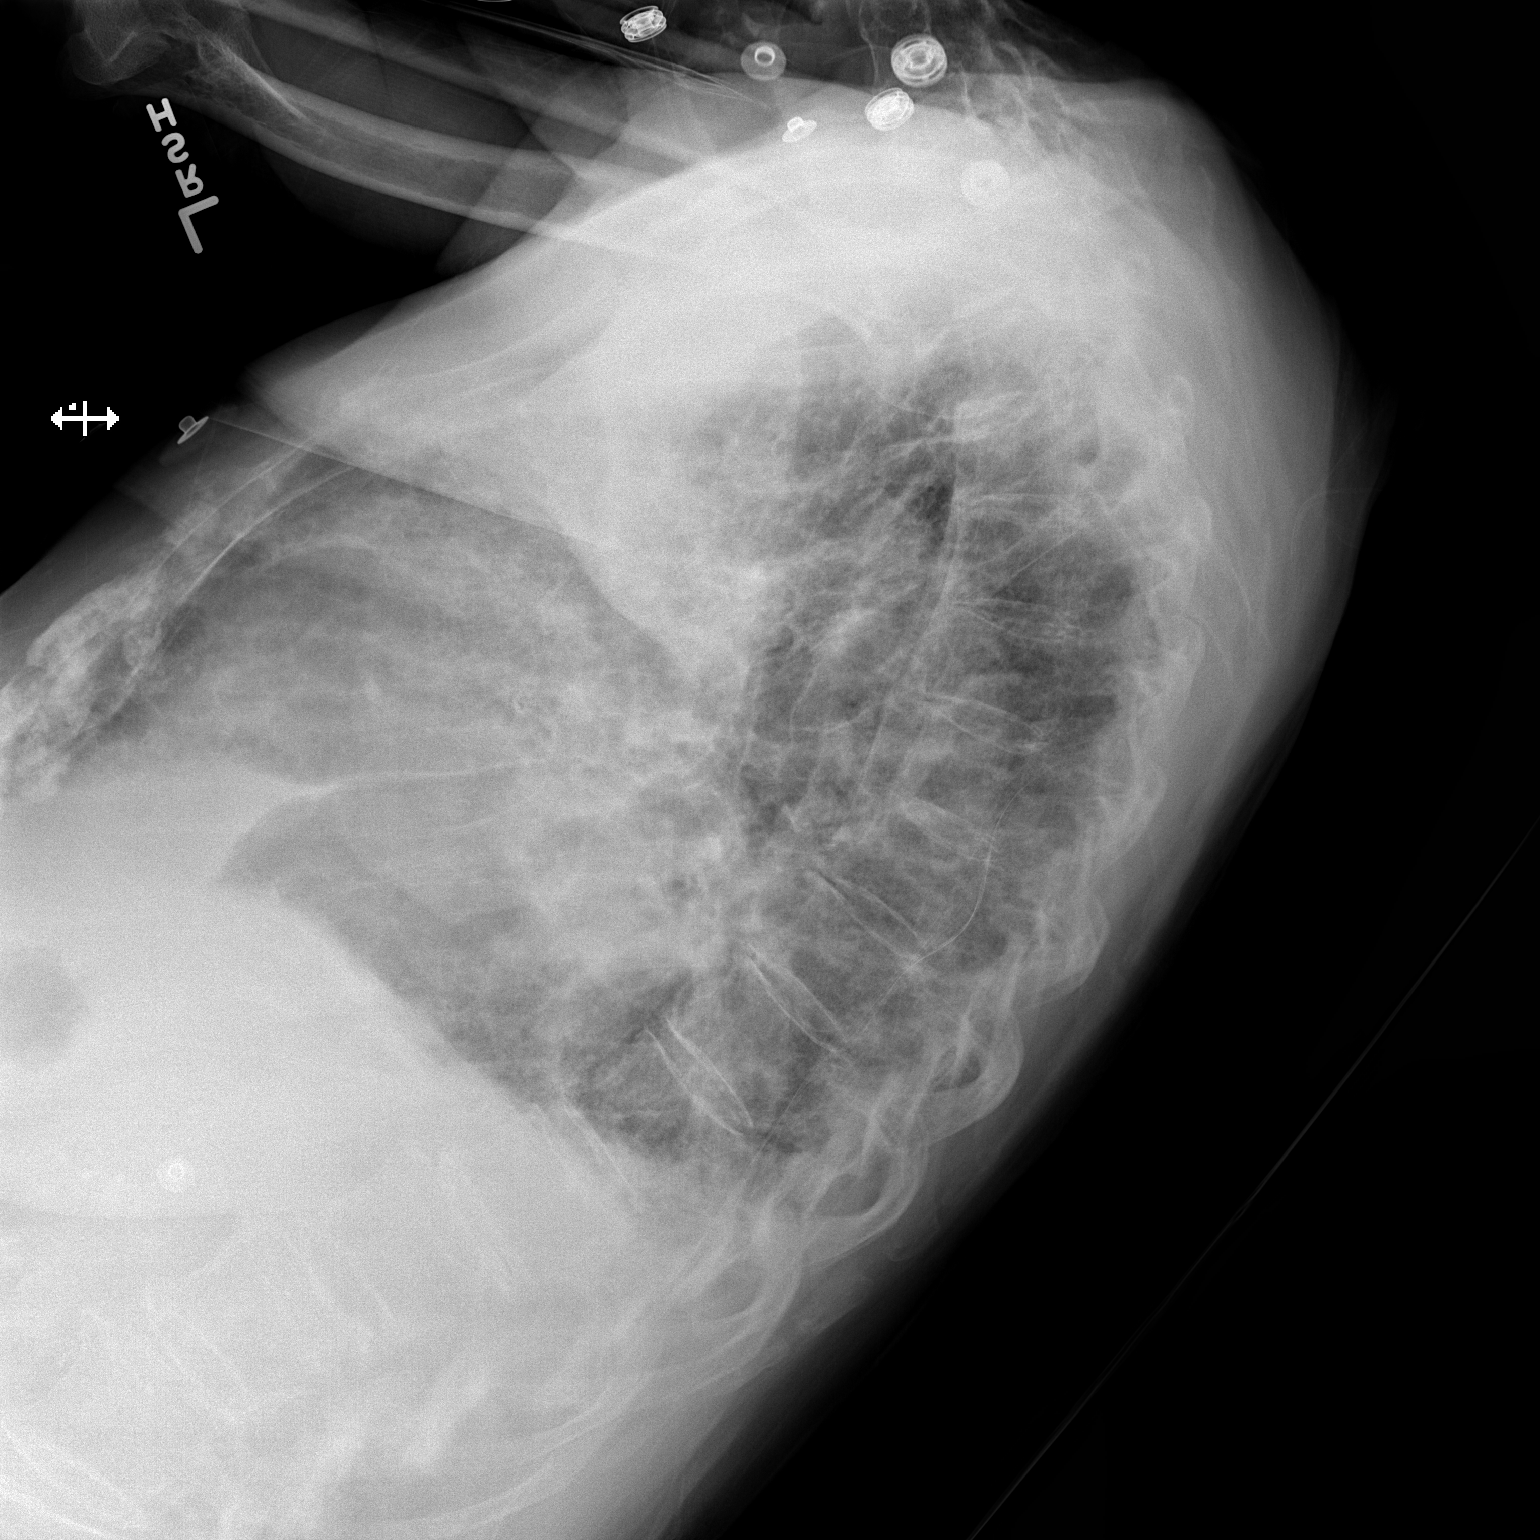

[2 of 2 positions shown; findings below may reference images not displayed]

FINDINGS: Cardiomegaly. Changes of pulmonary fibrosis in the lungs. More
prominent opacities noted in the lungs bilaterally could reflect
progressive fibrosis/chronic changes or superimposed acute edema. No
acute bony abnormality.
IMPRESSION: Cardiomegaly. Underlying chronic lung disease/ fibrosis. More
pronounced interstitial and airspace opacities in the lower lung
zones could reflect progressive fibrosis or superimposed acute
pulmonary edema.
